# Patient Record
Sex: Male | Born: 1955 | Race: Black or African American | Hispanic: No | Marital: Married | Smoking: Former smoker
Health system: Southern US, Community
[De-identification: ages and names within clinical notes are randomized; demographics above are authoritative.]

## PROBLEM LIST (undated history)

## (undated) DIAGNOSIS — G4730 Sleep apnea, unspecified: Secondary | ICD-10-CM

## (undated) DIAGNOSIS — I10 Essential (primary) hypertension: Secondary | ICD-10-CM

## (undated) DIAGNOSIS — T4145XA Adverse effect of unspecified anesthetic, initial encounter: Secondary | ICD-10-CM

## (undated) DIAGNOSIS — E1343 Other specified diabetes mellitus with diabetic autonomic (poly)neuropathy: Secondary | ICD-10-CM

## (undated) DIAGNOSIS — Z9889 Other specified postprocedural states: Secondary | ICD-10-CM

## (undated) DIAGNOSIS — E119 Type 2 diabetes mellitus without complications: Secondary | ICD-10-CM

## (undated) DIAGNOSIS — I509 Heart failure, unspecified: Secondary | ICD-10-CM

## (undated) DIAGNOSIS — M179 Osteoarthritis of knee, unspecified: Secondary | ICD-10-CM

## (undated) DIAGNOSIS — I5189 Other ill-defined heart diseases: Secondary | ICD-10-CM

## (undated) DIAGNOSIS — F419 Anxiety disorder, unspecified: Secondary | ICD-10-CM

## (undated) DIAGNOSIS — N289 Disorder of kidney and ureter, unspecified: Secondary | ICD-10-CM

## (undated) DIAGNOSIS — E039 Hypothyroidism, unspecified: Secondary | ICD-10-CM

## (undated) DIAGNOSIS — M1710 Unilateral primary osteoarthritis, unspecified knee: Secondary | ICD-10-CM

## (undated) DIAGNOSIS — T8859XA Other complications of anesthesia, initial encounter: Secondary | ICD-10-CM

## (undated) DIAGNOSIS — M1990 Unspecified osteoarthritis, unspecified site: Secondary | ICD-10-CM

## (undated) HISTORY — PX: BACK SURGERY: SHX140

## (undated) HISTORY — DX: Other specified postprocedural states: Z98.890

## (undated) HISTORY — DX: Sleep apnea, unspecified: G47.30

## (undated) HISTORY — DX: Type 2 diabetes mellitus without complications: E11.9

## (undated) HISTORY — DX: Unspecified osteoarthritis, unspecified site: M19.90

## (undated) HISTORY — PX: THYROIDECTOMY, PARTIAL: SHX18

## (undated) HISTORY — DX: Hypothyroidism, unspecified: E03.9

## (undated) HISTORY — DX: Osteoarthritis of knee, unspecified: M17.9

## (undated) HISTORY — DX: Unilateral primary osteoarthritis, unspecified knee: M17.10

## (undated) HISTORY — DX: Essential (primary) hypertension: I10

## (undated) HISTORY — DX: Disorder of kidney and ureter, unspecified: N28.9

---

## 1998-05-19 ENCOUNTER — Inpatient Hospital Stay (HOSPITAL_COMMUNITY): Admission: RE | Admit: 1998-05-19 | Discharge: 1998-05-22 | Payer: Self-pay | Admitting: Orthopedic Surgery

## 1998-05-25 ENCOUNTER — Other Ambulatory Visit: Admission: RE | Admit: 1998-05-25 | Discharge: 1998-05-25 | Payer: Self-pay | Admitting: Orthopedic Surgery

## 1998-06-01 ENCOUNTER — Other Ambulatory Visit: Admission: RE | Admit: 1998-06-01 | Discharge: 1998-06-01 | Payer: Self-pay | Admitting: Orthopedic Surgery

## 1998-06-08 ENCOUNTER — Other Ambulatory Visit: Admission: RE | Admit: 1998-06-08 | Discharge: 1998-06-08 | Payer: Self-pay | Admitting: Orthopedic Surgery

## 1998-06-26 ENCOUNTER — Encounter: Admission: RE | Admit: 1998-06-26 | Discharge: 1998-09-24 | Payer: Self-pay | Admitting: Orthopedic Surgery

## 1998-11-11 HISTORY — PX: JOINT REPLACEMENT: SHX530

## 1999-08-06 ENCOUNTER — Ambulatory Visit (HOSPITAL_COMMUNITY): Admission: RE | Admit: 1999-08-06 | Discharge: 1999-08-06 | Payer: Self-pay | Admitting: Internal Medicine

## 1999-08-06 ENCOUNTER — Encounter: Payer: Self-pay | Admitting: Internal Medicine

## 1999-11-12 HISTORY — PX: TOTAL KNEE ARTHROPLASTY: SHX125

## 2000-07-18 ENCOUNTER — Ambulatory Visit (HOSPITAL_COMMUNITY): Admission: RE | Admit: 2000-07-18 | Discharge: 2000-07-18 | Payer: Self-pay | Admitting: Internal Medicine

## 2000-07-18 ENCOUNTER — Encounter: Payer: Self-pay | Admitting: Internal Medicine

## 2000-07-27 ENCOUNTER — Ambulatory Visit (HOSPITAL_BASED_OUTPATIENT_CLINIC_OR_DEPARTMENT_OTHER): Admission: RE | Admit: 2000-07-27 | Discharge: 2000-07-27 | Payer: Self-pay | Admitting: Internal Medicine

## 2000-12-18 ENCOUNTER — Ambulatory Visit (HOSPITAL_COMMUNITY): Admission: RE | Admit: 2000-12-18 | Discharge: 2000-12-18 | Payer: Self-pay | Admitting: Family Medicine

## 2000-12-18 ENCOUNTER — Encounter: Payer: Self-pay | Admitting: Family Medicine

## 2001-01-05 ENCOUNTER — Ambulatory Visit (HOSPITAL_COMMUNITY): Admission: RE | Admit: 2001-01-05 | Discharge: 2001-01-05 | Payer: Self-pay | Admitting: Cardiology

## 2001-08-22 ENCOUNTER — Inpatient Hospital Stay (HOSPITAL_COMMUNITY): Admission: EM | Admit: 2001-08-22 | Discharge: 2001-08-24 | Payer: Self-pay | Admitting: Emergency Medicine

## 2001-08-23 ENCOUNTER — Encounter: Payer: Self-pay | Admitting: Internal Medicine

## 2002-12-22 ENCOUNTER — Encounter: Admission: RE | Admit: 2002-12-22 | Discharge: 2002-12-22 | Payer: Self-pay | Admitting: Family Medicine

## 2003-01-19 ENCOUNTER — Encounter: Admission: RE | Admit: 2003-01-19 | Discharge: 2003-01-19 | Payer: Self-pay | Admitting: Sports Medicine

## 2003-01-25 ENCOUNTER — Encounter: Admission: RE | Admit: 2003-01-25 | Discharge: 2003-04-25 | Payer: Self-pay | Admitting: Sports Medicine

## 2003-03-02 ENCOUNTER — Encounter: Admission: RE | Admit: 2003-03-02 | Discharge: 2003-03-02 | Payer: Self-pay | Admitting: Sports Medicine

## 2003-04-07 ENCOUNTER — Encounter: Admission: RE | Admit: 2003-04-07 | Discharge: 2003-04-07 | Payer: Self-pay | Admitting: Sports Medicine

## 2003-06-12 ENCOUNTER — Inpatient Hospital Stay (HOSPITAL_COMMUNITY): Admission: EM | Admit: 2003-06-12 | Discharge: 2003-06-16 | Payer: Self-pay | Admitting: Emergency Medicine

## 2003-06-12 ENCOUNTER — Encounter: Payer: Self-pay | Admitting: Emergency Medicine

## 2003-06-13 ENCOUNTER — Encounter (INDEPENDENT_AMBULATORY_CARE_PROVIDER_SITE_OTHER): Payer: Self-pay | Admitting: *Deleted

## 2003-06-28 ENCOUNTER — Encounter: Admission: RE | Admit: 2003-06-28 | Discharge: 2003-06-28 | Payer: Self-pay | Admitting: Sports Medicine

## 2003-07-26 ENCOUNTER — Encounter: Admission: RE | Admit: 2003-07-26 | Discharge: 2003-07-26 | Payer: Self-pay | Admitting: Sports Medicine

## 2003-08-30 ENCOUNTER — Encounter: Admission: RE | Admit: 2003-08-30 | Discharge: 2003-08-30 | Payer: Self-pay | Admitting: Sports Medicine

## 2003-09-13 ENCOUNTER — Encounter: Admission: RE | Admit: 2003-09-13 | Discharge: 2003-09-13 | Payer: Self-pay | Admitting: Family Medicine

## 2003-09-27 ENCOUNTER — Encounter: Admission: RE | Admit: 2003-09-27 | Discharge: 2003-09-27 | Payer: Self-pay | Admitting: Family Medicine

## 2003-11-01 ENCOUNTER — Encounter: Admission: RE | Admit: 2003-11-01 | Discharge: 2003-11-01 | Payer: Self-pay | Admitting: Sports Medicine

## 2003-12-07 ENCOUNTER — Encounter: Admission: RE | Admit: 2003-12-07 | Discharge: 2003-12-07 | Payer: Self-pay | Admitting: Family Medicine

## 2004-01-03 ENCOUNTER — Encounter: Admission: RE | Admit: 2004-01-03 | Discharge: 2004-01-03 | Payer: Self-pay | Admitting: Family Medicine

## 2004-02-01 ENCOUNTER — Encounter: Admission: RE | Admit: 2004-02-01 | Discharge: 2004-02-01 | Payer: Self-pay | Admitting: Sports Medicine

## 2004-02-14 ENCOUNTER — Encounter: Admission: RE | Admit: 2004-02-14 | Discharge: 2004-02-14 | Payer: Self-pay | Admitting: Family Medicine

## 2004-04-03 ENCOUNTER — Encounter: Admission: RE | Admit: 2004-04-03 | Discharge: 2004-04-03 | Payer: Self-pay | Admitting: Sports Medicine

## 2004-04-17 ENCOUNTER — Encounter: Admission: RE | Admit: 2004-04-17 | Discharge: 2004-04-17 | Payer: Self-pay | Admitting: Sports Medicine

## 2004-06-07 ENCOUNTER — Ambulatory Visit (HOSPITAL_COMMUNITY): Admission: RE | Admit: 2004-06-07 | Discharge: 2004-06-07 | Payer: Self-pay | Admitting: Cardiology

## 2004-09-04 ENCOUNTER — Ambulatory Visit (HOSPITAL_COMMUNITY): Admission: RE | Admit: 2004-09-04 | Discharge: 2004-09-04 | Payer: Self-pay | Admitting: Sports Medicine

## 2004-09-04 ENCOUNTER — Ambulatory Visit: Payer: Self-pay | Admitting: Sports Medicine

## 2004-09-18 ENCOUNTER — Ambulatory Visit: Payer: Self-pay | Admitting: Family Medicine

## 2004-09-22 ENCOUNTER — Emergency Department (HOSPITAL_COMMUNITY): Admission: EM | Admit: 2004-09-22 | Discharge: 2004-09-22 | Payer: Self-pay | Admitting: Family Medicine

## 2004-10-02 ENCOUNTER — Ambulatory Visit: Payer: Self-pay | Admitting: Sports Medicine

## 2004-10-09 ENCOUNTER — Ambulatory Visit: Payer: Self-pay | Admitting: Sports Medicine

## 2004-10-24 ENCOUNTER — Ambulatory Visit: Payer: Self-pay | Admitting: Sports Medicine

## 2004-10-24 ENCOUNTER — Ambulatory Visit (HOSPITAL_COMMUNITY): Admission: RE | Admit: 2004-10-24 | Discharge: 2004-10-24 | Payer: Self-pay | Admitting: Sports Medicine

## 2004-10-31 ENCOUNTER — Ambulatory Visit: Payer: Self-pay | Admitting: Sports Medicine

## 2004-11-07 ENCOUNTER — Ambulatory Visit: Payer: Self-pay | Admitting: Sports Medicine

## 2004-11-14 ENCOUNTER — Ambulatory Visit: Payer: Self-pay | Admitting: Sports Medicine

## 2004-11-27 ENCOUNTER — Encounter: Admission: RE | Admit: 2004-11-27 | Discharge: 2004-11-27 | Payer: Self-pay | Admitting: Cardiology

## 2005-01-01 ENCOUNTER — Ambulatory Visit: Payer: Self-pay | Admitting: Sports Medicine

## 2005-01-01 HISTORY — PX: OTHER SURGICAL HISTORY: SHX169

## 2005-02-12 ENCOUNTER — Ambulatory Visit: Payer: Self-pay | Admitting: Sports Medicine

## 2005-03-19 ENCOUNTER — Ambulatory Visit: Payer: Self-pay | Admitting: Sports Medicine

## 2005-05-06 ENCOUNTER — Ambulatory Visit: Payer: Self-pay | Admitting: Sports Medicine

## 2005-05-06 ENCOUNTER — Ambulatory Visit (HOSPITAL_COMMUNITY): Admission: RE | Admit: 2005-05-06 | Discharge: 2005-05-06 | Payer: Self-pay | Admitting: Sports Medicine

## 2005-05-07 ENCOUNTER — Ambulatory Visit: Payer: Self-pay | Admitting: Sports Medicine

## 2005-05-15 ENCOUNTER — Ambulatory Visit: Payer: Self-pay | Admitting: Sports Medicine

## 2005-06-11 ENCOUNTER — Ambulatory Visit: Payer: Self-pay | Admitting: Sports Medicine

## 2005-12-10 ENCOUNTER — Ambulatory Visit: Payer: Self-pay | Admitting: Sports Medicine

## 2005-12-23 ENCOUNTER — Ambulatory Visit: Payer: Self-pay | Admitting: Psychology

## 2006-01-07 ENCOUNTER — Ambulatory Visit: Payer: Self-pay | Admitting: Sports Medicine

## 2006-02-25 ENCOUNTER — Encounter: Admission: RE | Admit: 2006-02-25 | Discharge: 2006-02-25 | Payer: Self-pay | Admitting: Sports Medicine

## 2006-02-25 ENCOUNTER — Ambulatory Visit: Payer: Self-pay | Admitting: Sports Medicine

## 2006-03-25 ENCOUNTER — Ambulatory Visit: Payer: Self-pay | Admitting: Sports Medicine

## 2006-04-01 ENCOUNTER — Ambulatory Visit: Payer: Self-pay | Admitting: Sports Medicine

## 2006-04-01 ENCOUNTER — Encounter: Admission: RE | Admit: 2006-04-01 | Discharge: 2006-06-30 | Payer: Self-pay | Admitting: Sports Medicine

## 2006-05-01 ENCOUNTER — Ambulatory Visit: Payer: Self-pay | Admitting: Sports Medicine

## 2006-05-29 ENCOUNTER — Ambulatory Visit: Payer: Self-pay | Admitting: Sports Medicine

## 2006-07-31 ENCOUNTER — Ambulatory Visit: Payer: Self-pay | Admitting: Sports Medicine

## 2006-09-17 ENCOUNTER — Encounter (INDEPENDENT_AMBULATORY_CARE_PROVIDER_SITE_OTHER): Payer: Self-pay | Admitting: Specialist

## 2006-09-17 ENCOUNTER — Ambulatory Visit (HOSPITAL_COMMUNITY): Admission: RE | Admit: 2006-09-17 | Discharge: 2006-09-17 | Payer: Self-pay | Admitting: Gastroenterology

## 2006-09-30 ENCOUNTER — Ambulatory Visit: Payer: Self-pay | Admitting: Family Medicine

## 2006-10-21 ENCOUNTER — Ambulatory Visit: Payer: Self-pay | Admitting: Sports Medicine

## 2006-10-22 ENCOUNTER — Encounter: Admission: RE | Admit: 2006-10-22 | Discharge: 2007-01-20 | Payer: Self-pay | Admitting: Sports Medicine

## 2006-12-02 ENCOUNTER — Ambulatory Visit: Payer: Self-pay | Admitting: Family Medicine

## 2006-12-02 ENCOUNTER — Encounter: Payer: Self-pay | Admitting: Family Medicine

## 2006-12-02 LAB — CONVERTED CEMR LAB
BUN: 27 mg/dL — ABNORMAL HIGH (ref 6–23)
Chloride: 98 meq/L (ref 96–112)
Potassium: 4.4 meq/L (ref 3.5–5.3)

## 2006-12-21 ENCOUNTER — Emergency Department (HOSPITAL_COMMUNITY): Admission: EM | Admit: 2006-12-21 | Discharge: 2006-12-21 | Payer: Self-pay | Admitting: Emergency Medicine

## 2006-12-26 ENCOUNTER — Encounter: Payer: Self-pay | Admitting: Family Medicine

## 2006-12-26 ENCOUNTER — Ambulatory Visit: Payer: Self-pay | Admitting: Family Medicine

## 2006-12-26 LAB — CONVERTED CEMR LAB
CO2: 22 meq/L (ref 19–32)
Glucose, Bld: 132 mg/dL — ABNORMAL HIGH (ref 70–99)
Potassium: 3.5 meq/L (ref 3.5–5.3)
Sodium: 139 meq/L (ref 135–145)

## 2007-01-08 DIAGNOSIS — E669 Obesity, unspecified: Secondary | ICD-10-CM

## 2007-01-08 DIAGNOSIS — F329 Major depressive disorder, single episode, unspecified: Secondary | ICD-10-CM | POA: Insufficient documentation

## 2007-01-08 DIAGNOSIS — G4733 Obstructive sleep apnea (adult) (pediatric): Secondary | ICD-10-CM

## 2007-01-08 DIAGNOSIS — I2510 Atherosclerotic heart disease of native coronary artery without angina pectoris: Secondary | ICD-10-CM | POA: Insufficient documentation

## 2007-01-08 DIAGNOSIS — I5022 Chronic systolic (congestive) heart failure: Secondary | ICD-10-CM

## 2007-01-08 DIAGNOSIS — F17200 Nicotine dependence, unspecified, uncomplicated: Secondary | ICD-10-CM

## 2007-01-08 DIAGNOSIS — I10 Essential (primary) hypertension: Secondary | ICD-10-CM | POA: Insufficient documentation

## 2007-01-08 DIAGNOSIS — E039 Hypothyroidism, unspecified: Secondary | ICD-10-CM | POA: Insufficient documentation

## 2007-01-08 DIAGNOSIS — M109 Gout, unspecified: Secondary | ICD-10-CM

## 2007-01-13 ENCOUNTER — Ambulatory Visit: Payer: Self-pay | Admitting: Sports Medicine

## 2007-01-14 ENCOUNTER — Encounter (INDEPENDENT_AMBULATORY_CARE_PROVIDER_SITE_OTHER): Payer: Self-pay | Admitting: Sports Medicine

## 2007-01-14 ENCOUNTER — Ambulatory Visit: Payer: Self-pay | Admitting: Internal Medicine

## 2007-01-16 ENCOUNTER — Ambulatory Visit: Payer: Self-pay | Admitting: Internal Medicine

## 2007-01-20 ENCOUNTER — Ambulatory Visit (HOSPITAL_COMMUNITY): Admission: RE | Admit: 2007-01-20 | Discharge: 2007-01-20 | Payer: Self-pay | Admitting: Internal Medicine

## 2007-01-27 ENCOUNTER — Ambulatory Visit: Payer: Self-pay | Admitting: Sports Medicine

## 2007-01-27 ENCOUNTER — Ambulatory Visit: Payer: Self-pay | Admitting: Internal Medicine

## 2007-01-27 LAB — CONVERTED CEMR LAB
BUN: 14 mg/dL (ref 6–23)
CO2: 28 meq/L (ref 19–32)
Chloride: 104 meq/L (ref 96–112)
Glucose, Bld: 202 mg/dL — ABNORMAL HIGH (ref 70–99)
Potassium: 3.3 meq/L — ABNORMAL LOW (ref 3.5–5.3)

## 2007-02-06 ENCOUNTER — Telehealth (INDEPENDENT_AMBULATORY_CARE_PROVIDER_SITE_OTHER): Payer: Self-pay | Admitting: *Deleted

## 2007-03-10 ENCOUNTER — Ambulatory Visit: Payer: Self-pay | Admitting: Sports Medicine

## 2007-03-10 DIAGNOSIS — M1990 Unspecified osteoarthritis, unspecified site: Secondary | ICD-10-CM | POA: Insufficient documentation

## 2007-03-10 LAB — CONVERTED CEMR LAB
CO2: 24 meq/L (ref 19–32)
Calcium: 8.6 mg/dL (ref 8.4–10.5)
Creatinine, Ser: 1.27 mg/dL (ref 0.40–1.50)
Glucose, Bld: 193 mg/dL — ABNORMAL HIGH (ref 70–99)

## 2007-04-22 ENCOUNTER — Telehealth (INDEPENDENT_AMBULATORY_CARE_PROVIDER_SITE_OTHER): Payer: Self-pay | Admitting: Sports Medicine

## 2007-04-28 ENCOUNTER — Ambulatory Visit: Payer: Self-pay | Admitting: Sports Medicine

## 2007-04-28 LAB — CONVERTED CEMR LAB: Hgb A1c MFr Bld: 9 %

## 2007-04-29 LAB — CONVERTED CEMR LAB
ALT: 87 units/L — ABNORMAL HIGH (ref 0–53)
AST: 70 units/L — ABNORMAL HIGH (ref 0–37)
CO2: 21 meq/L (ref 19–32)
Calcium: 9 mg/dL (ref 8.4–10.5)
Chloride: 105 meq/L (ref 96–112)
Sodium: 143 meq/L (ref 135–145)
TSH: 2.49 microintl units/mL (ref 0.350–5.50)
Total Bilirubin: 0.5 mg/dL (ref 0.3–1.2)
Total Protein: 7.6 g/dL (ref 6.0–8.3)

## 2007-06-12 ENCOUNTER — Ambulatory Visit: Payer: Self-pay | Admitting: Family Medicine

## 2007-06-12 ENCOUNTER — Encounter: Payer: Self-pay | Admitting: Family Medicine

## 2007-06-12 LAB — CONVERTED CEMR LAB
BUN: 12 mg/dL (ref 6–23)
Chloride: 103 meq/L (ref 96–112)
Creatinine, Ser: 1.2 mg/dL (ref 0.40–1.50)
Glucose, Bld: 109 mg/dL — ABNORMAL HIGH (ref 70–99)

## 2007-06-22 ENCOUNTER — Encounter: Payer: Self-pay | Admitting: Family Medicine

## 2007-06-23 ENCOUNTER — Encounter: Payer: Self-pay | Admitting: Family Medicine

## 2007-09-02 ENCOUNTER — Ambulatory Visit: Payer: Self-pay | Admitting: Family Medicine

## 2007-11-30 ENCOUNTER — Encounter: Payer: Self-pay | Admitting: Family Medicine

## 2007-11-30 ENCOUNTER — Ambulatory Visit: Payer: Self-pay | Admitting: Sports Medicine

## 2007-11-30 LAB — CONVERTED CEMR LAB: Hgb A1c MFr Bld: 12.2 %

## 2007-12-02 ENCOUNTER — Ambulatory Visit: Payer: Self-pay | Admitting: Family Medicine

## 2007-12-04 ENCOUNTER — Ambulatory Visit: Payer: Self-pay | Admitting: Family Medicine

## 2007-12-18 ENCOUNTER — Ambulatory Visit: Payer: Self-pay | Admitting: Family Medicine

## 2007-12-18 LAB — CONVERTED CEMR LAB
Bilirubin Urine: NEGATIVE
Glucose, Urine, Semiquant: 500
WBC Urine, dipstick: NEGATIVE
pH: 6

## 2007-12-21 ENCOUNTER — Telehealth: Payer: Self-pay | Admitting: *Deleted

## 2007-12-30 ENCOUNTER — Ambulatory Visit: Payer: Self-pay | Admitting: Family Medicine

## 2007-12-31 ENCOUNTER — Ambulatory Visit: Payer: Self-pay | Admitting: Family Medicine

## 2008-01-28 ENCOUNTER — Ambulatory Visit: Payer: Self-pay | Admitting: Family Medicine

## 2008-02-16 ENCOUNTER — Telehealth (INDEPENDENT_AMBULATORY_CARE_PROVIDER_SITE_OTHER): Payer: Self-pay | Admitting: Pharmacist

## 2008-03-03 ENCOUNTER — Ambulatory Visit: Payer: Self-pay | Admitting: Family Medicine

## 2008-03-07 ENCOUNTER — Encounter: Payer: Self-pay | Admitting: Family Medicine

## 2008-04-27 ENCOUNTER — Ambulatory Visit: Payer: Self-pay | Admitting: Family Medicine

## 2008-04-28 ENCOUNTER — Encounter: Payer: Self-pay | Admitting: Family Medicine

## 2008-05-03 ENCOUNTER — Telehealth: Payer: Self-pay | Admitting: *Deleted

## 2008-05-03 ENCOUNTER — Telehealth: Payer: Self-pay | Admitting: Family Medicine

## 2008-05-30 ENCOUNTER — Encounter: Payer: Self-pay | Admitting: Family Medicine

## 2008-05-30 ENCOUNTER — Ambulatory Visit: Payer: Self-pay | Admitting: Family Medicine

## 2008-05-30 LAB — CONVERTED CEMR LAB: Hgb A1c MFr Bld: 7.7 %

## 2008-06-03 ENCOUNTER — Encounter: Payer: Self-pay | Admitting: Family Medicine

## 2008-06-03 LAB — CONVERTED CEMR LAB
Albumin: 4 g/dL (ref 3.5–5.2)
Alkaline Phosphatase: 89 units/L (ref 39–117)
BUN: 12 mg/dL (ref 6–23)
CO2: 24 meq/L (ref 19–32)
Calcium: 8.9 mg/dL (ref 8.4–10.5)
Creatinine, Ser: 1.25 mg/dL (ref 0.40–1.50)
Total Protein: 6.9 g/dL (ref 6.0–8.3)

## 2008-06-06 ENCOUNTER — Encounter: Payer: Self-pay | Admitting: Family Medicine

## 2008-10-05 ENCOUNTER — Ambulatory Visit: Payer: Self-pay | Admitting: Family Medicine

## 2008-10-05 LAB — CONVERTED CEMR LAB: Hgb A1c MFr Bld: 8.8 %

## 2008-11-08 ENCOUNTER — Encounter: Payer: Self-pay | Admitting: Family Medicine

## 2008-11-09 ENCOUNTER — Encounter: Payer: Self-pay | Admitting: Family Medicine

## 2008-11-09 DIAGNOSIS — E785 Hyperlipidemia, unspecified: Secondary | ICD-10-CM

## 2009-01-04 ENCOUNTER — Ambulatory Visit: Payer: Self-pay | Admitting: Family Medicine

## 2009-01-04 LAB — CONVERTED CEMR LAB
BUN: 12 mg/dL (ref 6–23)
CO2: 19 meq/L (ref 19–32)
Calcium: 8.9 mg/dL (ref 8.4–10.5)
Chloride: 110 meq/L (ref 96–112)
Creatinine, Ser: 1.02 mg/dL (ref 0.40–1.50)
TSH: 7.676 microintl units/mL — ABNORMAL HIGH (ref 0.350–4.50)

## 2009-01-09 ENCOUNTER — Encounter: Payer: Self-pay | Admitting: Family Medicine

## 2009-03-15 ENCOUNTER — Ambulatory Visit: Payer: Self-pay | Admitting: Family Medicine

## 2009-03-15 LAB — CONVERTED CEMR LAB
ALT: 101 units/L — ABNORMAL HIGH (ref 0–53)
AST: 67 units/L — ABNORMAL HIGH (ref 0–37)
Albumin: 4.2 g/dL (ref 3.5–5.2)
Calcium: 8.7 mg/dL (ref 8.4–10.5)
Chloride: 107 meq/L (ref 96–112)
PSA: 0.53 ng/mL (ref 0.10–4.00)
Potassium: 3.6 meq/L (ref 3.5–5.3)
Total Protein: 7.1 g/dL (ref 6.0–8.3)

## 2009-03-17 ENCOUNTER — Encounter: Payer: Self-pay | Admitting: Family Medicine

## 2009-03-29 ENCOUNTER — Telehealth: Payer: Self-pay | Admitting: Family Medicine

## 2009-03-29 ENCOUNTER — Telehealth: Payer: Self-pay | Admitting: *Deleted

## 2009-03-30 ENCOUNTER — Encounter: Payer: Self-pay | Admitting: Family Medicine

## 2009-09-27 ENCOUNTER — Ambulatory Visit: Payer: Self-pay | Admitting: Family Medicine

## 2009-09-27 LAB — CONVERTED CEMR LAB
Albumin/Creatinine Ratio, Urine, POC: ABNORMAL
Bilirubin Urine: NEGATIVE
CO2: 20 meq/L (ref 19–32)
Calcium: 8.9 mg/dL (ref 8.4–10.5)
Hgb A1c MFr Bld: 7.3 %
Ketones, urine, test strip: NEGATIVE
Nitrite: NEGATIVE
Sodium: 140 meq/L (ref 135–145)
Specific Gravity, Urine: 1.01

## 2009-09-28 ENCOUNTER — Encounter: Payer: Self-pay | Admitting: Family Medicine

## 2009-10-02 ENCOUNTER — Encounter: Payer: Self-pay | Admitting: Family Medicine

## 2009-12-06 ENCOUNTER — Encounter: Payer: Self-pay | Admitting: Family Medicine

## 2010-01-26 ENCOUNTER — Telehealth: Payer: Self-pay | Admitting: *Deleted

## 2010-02-28 ENCOUNTER — Ambulatory Visit: Payer: Self-pay | Admitting: Family Medicine

## 2010-02-28 LAB — CONVERTED CEMR LAB
ALT: 31 units/L (ref 0–53)
AST: 23 units/L (ref 0–37)
Albumin: 4.1 g/dL (ref 3.5–5.2)
BUN: 13 mg/dL (ref 6–23)
Calcium: 9 mg/dL (ref 8.4–10.5)
Chloride: 103 meq/L (ref 96–112)
Potassium: 4 meq/L (ref 3.5–5.3)
Sodium: 138 meq/L (ref 135–145)
Total Protein: 7.3 g/dL (ref 6.0–8.3)

## 2010-03-09 ENCOUNTER — Encounter: Payer: Self-pay | Admitting: Family Medicine

## 2010-08-01 ENCOUNTER — Ambulatory Visit: Payer: Self-pay | Admitting: Family Medicine

## 2010-08-01 DIAGNOSIS — E118 Type 2 diabetes mellitus with unspecified complications: Secondary | ICD-10-CM

## 2010-08-01 LAB — CONVERTED CEMR LAB
BUN: 14 mg/dL (ref 6–23)
CO2: 19 meq/L (ref 19–32)
Calcium: 8.9 mg/dL (ref 8.4–10.5)
Creatinine, Ser: 1.2 mg/dL (ref 0.40–1.50)
Glucose, Bld: 208 mg/dL — ABNORMAL HIGH (ref 70–99)
Hgb A1c MFr Bld: 8.5 %
Total Bilirubin: 0.4 mg/dL (ref 0.3–1.2)

## 2010-08-06 ENCOUNTER — Encounter: Payer: Self-pay | Admitting: Family Medicine

## 2010-08-14 ENCOUNTER — Telehealth: Payer: Self-pay | Admitting: Family Medicine

## 2010-08-30 ENCOUNTER — Telehealth: Payer: Self-pay | Admitting: *Deleted

## 2010-08-31 ENCOUNTER — Encounter (INDEPENDENT_AMBULATORY_CARE_PROVIDER_SITE_OTHER): Payer: Self-pay | Admitting: Pharmacist

## 2010-10-02 ENCOUNTER — Ambulatory Visit: Payer: Self-pay | Admitting: Cardiology

## 2010-10-09 ENCOUNTER — Telehealth: Payer: Self-pay | Admitting: *Deleted

## 2010-11-14 ENCOUNTER — Telehealth: Payer: Self-pay | Admitting: Family Medicine

## 2010-12-02 ENCOUNTER — Encounter: Payer: Self-pay | Admitting: Family Medicine

## 2010-12-11 NOTE — Progress Notes (Signed)
Summary: phn msg  Phone Note Call from Patient Call back at [facility] Phone [phone]   Caller: Patient Summary of Call: needs to talk to nurse about his cpap machine -  Initial call taken by: De Nurse,  August 14, 2010 11:57 AM  Follow-up for Phone Call        Needing CPAP mask has not been able to get one in . Having issues sleeping. [facility] is where he wants his mask from.  Follow-up by: Jimmy Footman, CMA,  August 14, 2010 2:50 PM  Additional Follow-up for Phone Call Additional follow up Details #1::        DWT  if you can give me the fax number of the place he wants his mask from, and his mask partculars--type, size etc--I will be happy to fax a Rx Thanks!  Denny Levy MD  August 14, 2010 3:08 PM     Additional Follow-up for Phone Call Additional follow up Details #2::    spoke with patient... medium size mask...he says the type he needs is the "blue soft gel type". [facility] fax# is [phone] Follow-up by: Jimmy Footman, CMA,  August 14, 2010 3:34 PM  New/Updated Medications: * LARGE BLUE SOFT GEL CPAP MASK dispense 1 sig as directed Prescriptions: LARGE BLUE SOFT GEL CPAP MASK dispense 1 sig as directed  #1 x 0   Entered and Authorized by:   Denny Levy MD   Signed by:   Denny Levy MD on 08/20/2010   Method used:   Printed then faxed to ...       [facility]* (mail-order)       [address]       [place], [place]  [address]       Ph: [phone]       Fax: [phone]   RxID:   [id number]  faxed to above number

## 2010-12-11 NOTE — Miscellaneous (Signed)
Summary: Orders Update  Clinical Lists Changes  Problems: Added new problem of ENCOUNTER FOR LONG-TERM USE OF OTHER MEDICATIONS (ICD-V58.69) Orders: Added new Test order of B12-[facility] (82607-23330) - Signed Added new Test order of CBC-[facility] (85027) - Signed 

## 2010-12-11 NOTE — Letter (Signed)
Summary: LAB Letter  [facility]  [address]   [place], [place] [address]   Phone: [phone]  Fax: [phone]    03/09/2010  [name] [name] [address] [place], [place]  [address]  Dear Mr. Badger,  Your lab work looks good except for your A1C which was 10, and less than 7 is goal. Your LDL cholesterol is 60 and your goal is less than 70 so right on the mark!         Sincerely,   Denny Levy MD

## 2010-12-11 NOTE — Progress Notes (Signed)
  Phone Note Other Incoming   Summary of Call: Lurena Joiner from [facility].  Please send back the order that was faxed for the rx.  Only received the rx.  Order form have necesary info that is needed to complete process.  Fax # [phone].   Initial call taken by: Abundio Miu,  August 30, 2010 2:33 PM  Follow-up for Phone Call        I never received the order form so they need to [facility] it to me Thanks!  Denny Levy MD  August 31, 2010 11:03 AM   Additional Follow-up for Phone Call Additional follow up Details #1::        spoke with neil with [facility] coustomer service. He is going to fax over request again with attn to dr. Jennette Kettle Additional Follow-up by: Jimmy Footman, CMA,  August 31, 2010 11:55 AM

## 2010-12-11 NOTE — Progress Notes (Signed)
Summary: phn msg  Phone Note Call from Patient Call back at [phone]   Caller: Lurena Joiner- [facility] Summary of Call: calling about [facility] -checking on Phys orders  Initial call taken by: De Nurse,  October 09, 2010 9:03 AM  Follow-up for Phone Call        Archie Patten did you find his sleep study????? Thanks!  Denny Levy MD  October 09, 2010 3:52 PM   Additional Follow-up for Phone Call Additional follow up Details #1::        sorry, no one has a record of this pt's sleep study.  I thought that i had spoke to you about this, again my apologies Additional Follow-up by: Loralee Pacas CMA,  October 09, 2010 5:26 PM    Additional Follow-up for Phone Call Additional follow up Details #2::    OK call Kaydin and see if HE knows when and where it was done--if not--we will probably have to do ANOTHER sleep study. Please also tell him WHy it is taking so long to get his CPAp stuff---they will not authorize without a copy of his sleep study.  Thanks!  Denny Levy MD  October 10, 2010 8:56 AM   Additional Follow-up for Phone Call Additional follow up Details #3:: Details for Additional Follow-up Action Taken: information sent to perform another sleep study for pt Additional Follow-up by: Loralee Pacas CMA,  October 11, 2010 10:10 AM

## 2010-12-11 NOTE — Letter (Signed)
Summary: [facility]  [address]   [place], [place] [address]   Phone: [phone]  Fax: [phone]    08/06/2010  [name] [name] [address] [place], [place]  [address]  Dear [name],  All of your labs were OK.         Sincerely,   Denny Levy MD  Appended Document: LABLetter mailed.

## 2010-12-11 NOTE — Assessment & Plan Note (Signed)
Summary: dental referral/eo   Vital Signs:  Patient profile:   55 year old male Height:      71 inches Weight:      280.8 pounds BMI:     39.31 Temp:     98.2 degrees F oral Pulse rate:   89 / minute BP sitting:   117 / 75  (left arm) Cuff size:   large  Vitals Entered By: Gladstone Pih (February 28, 2010 8:50 AM) CC: Wants dental referral,for upper dentures, med refill Is Patient Diabetic? No Pain Assessment Patient in pain? no        Primary Care Provider:  Denny Levy MD  CC:  Wants dental referral, for upper dentures, and med refill.  History of Present Illness: Diabetes follow up with blood sugars in  moderately   good control,   no episodes of low blood sugar. Taking medicines regularly and having no problems with them. Needs refills  A lot of problems w his teeth--thinks he needs new dentures--has a loose toothe that is supposed to hold his bridge in place--not working well  Having problems w his CPAP mask and cannot afford a new one   Habits & Providers  Alcohol-Tobacco-Diet     Tobacco Status: current     Tobacco Counseling: to quit use of tobacco products     Cigarette Packs/Day: 1.0  Current Medications (verified): 1)  Levothroid 100 Mcg  Tabs (Levothyroxine Sodium) .... On Hold Per Dr Deborah Chalk 2)  Allopurinol 100 Mg Tabs (Allopurinol) .... Take 1 Tablet By Mouth Once A Day 3)  Cardura 4 Mg Tabs (Doxazosin Mesylate) .Marland Kitchen.. 1 By Mouth Qd 4)  Colchicine 0.6 Mg Tabs (Colchicine) .... Take 1 Tablet By Mouth Twice A Day 5)  Glucophage 1000 Mg Tabs (Metformin Hcl) .... Take 1 Tablet By Mouth Twice A Day 6)  Glucotrol Xl 10 Mg Tb24 (Glipizide) .... Take 1 Tablet By Mouth Twice A Day 7)  Simvastatin 20 Mg  Tabs (Simvastatin) .Marland Kitchen.. 1 By Mouth Qd 8)  Lotensin 40 Mg Tabs (Benazepril Hcl) .... Take 1 Tablet By Mouth Once A Day 9)  Metoprolol Tartrate 50 Mg Tabs (Metoprolol Tartrate) .... 2 Tabs By Mouth Two Times A Day 10)  Norvasc 10 Mg Tabs (Amlodipine Besylate) .... Take  1 Tablet By Mouth Once A Day 11)  Buspirone Hcl 10 Mg Tabs (Buspirone Hcl) .Marland Kitchen.. 1 Tablet By Mouth Twice A Day 12)  Lantus 100 Unit/ml  Soln (Insulin Glargine) .... Sig 50 U Once Daily As Directed Disp 3 Months 13)  Bd Insulin Syringe Ultrafine 30g X 1/2" 1 Ml  Misc (Insulin Syringe-Needle U-100) .... Dispense Quantity  For Once Daily Injections. 14)  Penicillin V Potassium 500 Mg Tabs (Penicillin V Potassium) .Marland Kitchen.. 1 By Mouth Qid  Allergies: 1)  Avandia (Rosiglitazone Maleate)  Social History: Packs/Day:  1.0  Review of Systems       see hpi  Physical Exam  Mouth:  a lot of missing teeth. right front upper incisor is very l;oose. No apparent abscess but a lot of gingival disease. Neck:  supple, full ROM, and no masses.   Lungs:  normal breath sounds.   Heart:  regular rhythm, no murmur, and no gallop.     Impression & Recommendations:  Problem # 1:  OTHER LOSS OF TEETH (ICD-525.19)  Orders: [facility]- Est  Level 4 ([address]) with his extensove gingival disease and probable upcoming loss of incisor, will tx w abx. info given for indigent dental /denture sources  Problem # 2:  DIABETES MELLITUS, TYPE II (ICD-250.00)  Orders: A1C-[facility] ([address]) Comp Met-[facility] (60[address]-09811) [facility]- Est  Level 4 ([address])check labs refills discussed diet and exercise   Complete Medication List: 1)  Levothroid 100 Mcg Tabs (Levothyroxine sodium) .... On hold per dr Deborah Chalk 2)  Allopurinol 100 Mg Tabs (Allopurinol) .... Take 1 tablet by mouth once a day 3)  Cardura 4 Mg Tabs (Doxazosin mesylate) .Marland Kitchen.. 1 by mouth qd 4)  Colchicine 0.6 Mg Tabs (Colchicine) .... Take 1 tablet by mouth twice a day 5)  Glucophage 1000 Mg Tabs (Metformin hcl) .... Take 1 tablet by mouth twice a day 6)  Glucotrol Xl 10 Mg Tb24 (Glipizide) .... Take 1 tablet by mouth twice a day 7)  Simvastatin 20 Mg Tabs (Simvastatin) .Marland Kitchen.. 1 by mouth qd 8)  Lotensin 40 Mg Tabs (Benazepril hcl) .... Take 1 tablet by mouth once a day 9)  Metoprolol  Tartrate 50 Mg Tabs (Metoprolol tartrate) .... 2 tabs by mouth two times a day 10)  Norvasc 10 Mg Tabs (Amlodipine besylate) .... Take 1 tablet by mouth once a day 11)  Buspirone Hcl 10 Mg Tabs (Buspirone hcl) .Marland Kitchen.. 1 tablet by mouth twice a day 12)  Lantus 100 Unit/ml Soln (Insulin glargine) .... Sig 50 u once daily as directed disp 3 months 13)  Bd Insulin Syringe Ultrafine 30g X 1/2" 1 Ml Misc (Insulin syringe-needle u-100) .... Dispense quantity  for once daily injections. 14)  Penicillin V Potassium 500 Mg Tabs (Penicillin v potassium) .Marland Kitchen.. 1 by mouth qid A1C-[facility] (91478) Direct LDL-[facility] ([address]-13086) Comp Met-[facility] (57846-96295) TSH-[facility] (28413-24401) [facility]- Est  Level 4 ([address])  Prescriptions: PENICILLIN V POTASSIUM 500 MG TABS (PENICILLIN V POTASSIUM) 1 by mouth qid  #40 x 1   Entered and Authorized by:   Denny Levy MD   Signed by:   Denny Levy MD on 02/28/2010   Method used:   Print then Give to Patient   RxID:   [id number] GLUCOPHAGE 1000 MG TABS (METFORMIN HCL) Take 1 tablet by mouth twice a day  #180 x 3   Entered and Authorized by:   Denny Levy MD   Signed by:   Denny Levy MD on 02/28/2010   Method used:   Print then Give to Patient   RxID:   [id number]   Prevention & Chronic Care Immunizations   Influenza vaccine: Not documented    Tetanus booster: 03/15/2009: Tdap    Pneumococcal vaccine: Not documented  Colorectal Screening   Hemoccult: not indicated had colonoscopy  (11/08/2008)   Hemoccult due: Not Indicated    Colonoscopy: Done.  (08/11/2006)   Colonoscopy due: 08/11/2016  Other Screening   PSA: 0.53  (03/15/2009)   Smoking status: current  (02/28/2010)   Smoking cessation counseling: yes  (01/28/2008)  Diabetes Mellitus   HgbA1C: 10.0  (02/28/2010)   Hemoglobin A1C due: 01/05/2009    Eye exam: Not documented    Foot exam: Not documented   High risk foot: Not documented   Foot care education: Not documented    Urine microalbumin/creatinine  ratio: Not documented    Diabetes flowsheet reviewed?: Yes   Progress toward A1C goal: Unchanged  Lipids   Total Cholesterol: 119  (03/15/2009)   LDL: 44  (03/15/2009)   LDL Direct: Not documented   HDL: 30  (03/15/2009)   Triglycerides: 226  (03/15/2009)    SGOT (AST): 67  (03/15/2009)   SGPT (ALT): 101  (03/15/2009) CMP ordered    Alkaline phosphatase: 118  (  03/15/2009)   Total bilirubin: 0.6  (03/15/2009)    Lipid flowsheet reviewed?: Yes   Progress toward LDL goal: Unchanged  Hypertension   Last Blood Pressure: 117 / 75  (02/28/2010)   Serum creatinine: 1.12  (09/27/2009)   BMP action: Ordered   Serum potassium 3.6  (09/27/2009) CMP ordered     Hypertension flowsheet reviewed?: Yes   Progress toward BP goal: At goal  Self-Management Support :    Diabetes self-management support: Not documented    Hypertension self-management support: Not documented    Hypertension self-management support not done because: Good outcomes  (09/27/2009)    Lipid self-management support: Not documented     Lipid self-management support not done because: Good outcomes  (09/27/2009)  Laboratory Results   Blood Tests   Date/Time Received: February 28, 2010 8:55 AM  Date/Time Reported: February 28, 2010 9:22 AM   HGBA1C: 10.0%   (Normal Range: Non-Diabetic - 3-6%   Control Diabetic - 6-8%)  Comments: ...............test performed by......Marland KitchenBonnie A. [place], MLS (ASCP)cm      Impression & Recommendations:  Orders: [facility]- Est  Level 4 ([address])   His updated medication list for this problem includes:    Glucophage 1000 Mg Tabs (Metformin hcl) .Marland Kitchen... Take 1 tablet by mouth twice a day    Glucotrol Xl 10 Mg Tb24 (Glipizide) .Marland Kitchen... Take 1 tablet by mouth twice a day    Lotensin 40 Mg Tabs (Benazepril hcl) .Marland Kitchen... Take 1 tablet by mouth once a day    Lantus 100 Unit/ml Soln (Insulin glargine) ..... Sig 50 u once daily as directed disp 3 months  Orders: A1C-[facility] (95638) Comp Met-[facility]  (75[address]951) [facility]- Est  Level 4 (884[address])   Complete Medication List: 1)  Levothroid 100 Mcg Tabs (Levothyroxine sodium) .... On hold per dr Deborah Chalk 2)  Allopurinol 100 Mg Tabs (Allopurinol) .... Take 1 tablet by mouth once a day 3)  Cardura 4 Mg Tabs (Doxazosin mesylate) .Marland Kitchen.. 1 by mouth qd 4)  Colchicine 0.6 Mg Tabs (Colchicine) .... Take 1 tablet by mouth twice a day 5)  Glucophage 1000 Mg Tabs (Metformin hcl) .... Take 1 tablet by mouth twice a day 6)  Glucotrol Xl 10 Mg Tb24 (Glipizide) .... Take 1 tablet by mouth twice a day 7)  Simvastatin 20 Mg Tabs (Simvastatin) .Marland Kitchen.. 1 by mouth qd 8)  Lotensin 40 Mg Tabs (Benazepril hcl) .... Take 1 tablet by mouth once a day 9)  Metoprolol Tartrate 50 Mg Tabs (Metoprolol tartrate) .... 2 tabs by mouth two times a day 10)  Norvasc 10 Mg Tabs (Amlodipine besylate) .... Take 1 tablet by mouth once a day 11)  Buspirone Hcl 10 Mg Tabs (Buspirone hcl) .Marland Kitchen.. 1 tablet by mouth twice a day 12)  Lantus 100 Unit/ml Soln (Insulin glargine) .... Sig 50 u once daily as directed disp 3 months 13)  Bd Insulin Syringe Ultrafine 30g X 1/2" 1 Ml Misc (Insulin syringe-needle u-100) .... Dispense quantity  for once daily injections. 14)  Penicillin V Potassium 500 Mg Tabs (Penicillin v potassium) .Marland Kitchen.. 1 by mouth qid  Other Orders: Direct LDL-[facility] ([address]) TSH-[facility] [phone])

## 2010-12-11 NOTE — Consult Note (Signed)
Summary: [facility]   Imported By: Clydell Hakim 12/07/2009 15:42:46  _____________________________________________________________________  External Attachment:    Type:   Image     Comment:   External Document

## 2010-12-11 NOTE — Assessment & Plan Note (Signed)
Summary: dm f/u,df   Vital Signs:  Patient profile:   55 year old male Weight:      232.7 pounds Temp:     97.8 degrees F oral Pulse rate:   76 / minute Pulse rhythm:   regular BP sitting:   135 / 89  (left arm) Cuff size:   large  Vitals Entered By: Loralee Pacas CMA (August 01, 2010 10:56 AM) CC: follow-up visit Is Patient Diabetic? Yes   Primary Care Provider:  Denny Levy MD  CC:  follow-up visit.  History of Present Illness: Diabetes follow up with blood sugars in    good control,   no episodes of low blood sugar. Taking medicines regularly and having no problems with them.   Follow up hypertension. Taking medicines regularly with no problems. Not having any any headaches or chest pains.  CPAP machine is in need of new mask and he cannot afford co-pay--he has eessebtially been withjout CPAP for 6 weeks and is feeling tired. Exploribg options with a new company that may let him waive co pay in full and pay itin installmanets.  Habits & Providers  Alcohol-Tobacco-Diet     Tobacco Status: current     Tobacco Counseling: to quit use of tobacco products     Cigarette Packs/Day: 1.5     Year Quit: 2009     Passive Smoke Exposure: no  Current Medications (verified): 1)  Levothroid 100 Mcg  Tabs (Levothyroxine Sodium) .... On Hold Per Dr Deborah Chalk 2)  Allopurinol 100 Mg Tabs (Allopurinol) .... Take 1 Tablet By Mouth Once A Day 3)  Cardura 8 Mg Tabs (Doxazosin Mesylate) .Marland Kitchen.. 1 By Mouth Qd 4)  Colchicine 0.6 Mg Tabs (Colchicine) .... Take 1 Tablet By Mouth Twice A Day 5)  Glucophage 1000 Mg Tabs (Metformin Hcl) .... Take 1 Tablet By Mouth Twice A Day 6)  Glucotrol Xl 10 Mg Tb24 (Glipizide) .... Take 1 Tablet By Mouth Twice A Day 7)  Simvastatin 20 Mg  Tabs (Simvastatin) .Marland Kitchen.. 1 By Mouth Qd 8)  Lotensin 40 Mg Tabs (Benazepril Hcl) .... Take 1 Tablet By Mouth Once A Day 9)  Metoprolol Tartrate 100 Mg Tabs (Metoprolol Tartrate) .Marland Kitchen.. 1 Tab By Mouth Bid 10)  Norvasc 10 Mg Tabs  (Amlodipine Besylate) .... Take 1 Tablet By Mouth Once A Day 11)  Lantus 100 Unit/ml  Soln (Insulin Glargine) .... Sig 50 U Once Daily As Directed Disp 3 Months 12)  Bd Insulin Syringe Ultrafine 30g X 1/2" 1 Ml  Misc (Insulin Syringe-Needle U-100) .... Dispense Quantity  For Once Daily Injections. 13)  Penicillin V Potassium 500 Mg Tabs (Penicillin V Potassium) .Marland Kitchen.. 1 By Mouth Qid 14)  Lasix 80 Mg Tabs (Furosemide) .... 1/2 - 1 By Mouth Once Daily As Needed Lower Extremity Edema  Allergies: 1)  Avandia (Rosiglitazone Maleate)  Social History: Packs/Day:  1.5  Physical Exam  General:  alert, well-developed, well-nourished, and well-hydrated.   Neck:  supple, full ROM, no masses, and no thyromegaly.   Lungs:  normal breath sounds.   Heart:  normal rate and regular rhythm.   Extremities:  no edema Neurologic:  alert & oriented X3.     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 On hold per dr Deborah Chalk 2)  Allopurinol 100 Mg Tabs (Allopurinol) .... Take 1 tablet by mouth once a day 3)  Cardura 8 Mg Tabs (Doxazosin mesylate) .Marland Kitchen.. 1 by mouth qd 4)  Colchicine 0.6 Mg Tabs (Colchicine) .... Take 1 tablet by mouth twice a day 5)  Glucophage 1000 Mg Tabs (Metformin hcl) .... Take 1 tablet by mouth twice a day 6)  Glucotrol Xl 10 Mg Tb24 (Glipizide) .... Take 1 tablet by mouth twice a day 7)  Simvastatin 20 Mg Tabs (Simvastatin) .Marland Kitchen.. 1 by mouth qd 8)  Lotensin 40 Mg Tabs (Benazepril hcl) .... Take 1  tablet by mouth once a day 9)  Metoprolol Tartrate 100 Mg Tabs (Metoprolol tartrate) .Marland Kitchen.. 1 tab by mouth bid 10)  Norvasc 10 Mg Tabs (Amlodipine besylate) .... Take 1 tablet by mouth once a day 11)  Lantus 100 Unit/ml Soln (Insulin glargine) .... Sig 50 u once daily as directed disp 3 months 12)  Bd Insulin Syringe Ultrafine 30g X 1/2" 1 Ml Misc (Insulin syringe-needle u-100) .... Dispense quantity  for once daily injections. 13)  Penicillin V Potassium 500 Mg Tabs (Penicillin v potassium) .Marland Kitchen.. 1 by mouth qid 14)  Lasix 80 Mg Tabs (Furosemide) .... 1/2 - 1 by mouth once daily as needed lower extremity edema A1C-[facility] [phone]) Comp Met-[facility] [phone]) [facility]- Est  Level 4 ([address])  Laboratory Results   Blood Tests   Date/Time Received: August 01, 2010 11:03 AM  Date/Time Reported: August 01, 2010 11:12 AM   HGBA1C: 8.5%   (Normal Range: Non-Diabetic - 3-6%   Control Diabetic - 6-8%)  Comments: ...........test performed by...........Marland KitchenTerese Door, CMA     Prevention & Chronic Care Immunizations   Influenza vaccine: Not documented    Tetanus booster: 03/15/2009: Tdap    Pneumococcal vaccine: Not documented  Colorectal Screening   Hemoccult: not indicated had colonoscopy  (11/08/2008)   Hemoccult due: Not Indicated    Colonoscopy: Done.  (08/11/2006)   Colonoscopy due: 08/11/2016  Other Screening   PSA: 0.53  (03/15/2009)   Smoking status: current  (08/01/2010)   Smoking cessation counseling: yes  (01/28/2008)  Diabetes Mellitus   HgbA1C: 8.5  (08/01/2010)   Hemoglobin A1C due: 01/05/2009    Eye exam: Not documented    Foot exam: Not documented   High risk foot: Not documented   Foot care education: Not documented    Urine microalbumin/creatinine ratio: Not documented    Diabetes flowsheet reviewed?: Yes   Progress toward A1C goal: Improved  Lipids   Total Cholesterol: 119  (03/15/2009)   LDL: 44  (03/15/2009)   LDL Direct: 60  (02/28/2010)   HDL: 30   (03/15/2009)   Triglycerides: 226  (03/15/2009)    SGOT (AST): 23  (02/28/2010)   SGPT (ALT): 31  (02/28/2010) CMP ordered    Alkaline phosphatase: 98  (02/28/2010)   Total bilirubin: 0.6  (02/28/2010)    Lipid flowsheet reviewed?: Yes   Progress toward LDL goal: Unchanged  Hypertension   Last Blood Pressure: 135 / 89  (08/01/2010)   Serum creatinine: 1.12  (02/28/2010)   BMP action: Ordered   Serum potassium 4.0  (02/28/2010) CMP ordered     Hypertension flowsheet reviewed?: Yes   Progress toward BP goal: At goal  Self-Management Support :  Diabetes self-management support: Not documented    Hypertension self-management support: Not documented    Hypertension self-management support not done because: Good outcomes  (09/27/2009)    Lipid self-management support: Not documented     Lipid self-management support not done because: Good outcomes  (09/27/2009)    Impression & Recommendations:  His updated medication list for this problem includes:    Glucophage 1000 Mg Tabs (Metformin hcl) .Marland Kitchen... Take 1 tablet by mouth twice a day    Glucotrol Xl 10 Mg Tb24 (Glipizide) .Marland Kitchen... Take 1 tablet by mouth twice a day    Lotensin 40 Mg Tabs (Benazepril hcl) .Marland Kitchen... Take 1 tablet by mouth once a day    Lantus 100 Unit/ml Soln (Insulin glargine) ..... Sig 50 u once daily as directed disp 3 months  Orders: [facility]- Est  Level 4 ([address])   His updated medication list for this problem includes:    Cardura 8 Mg Tabs (Doxazosin mesylate) .Marland Kitchen... 1 by mouth qd    Lotensin 40 Mg Tabs (Benazepril hcl) .Marland Kitchen... Take 1 tablet by mouth once a day    Metoprolol Tartrate 100 Mg Tabs (Metoprolol tartrate) .Marland Kitchen... 1 tab by mouth bid    Norvasc 10 Mg Tabs (Amlodipine besylate) .Marland Kitchen... Take 1 tablet by mouth once a day    Lasix 80 Mg Tabs (Furosemide) .Marland Kitchen... 1/2 - 1 by mouth once daily as needed lower extremity edema  Orders: Comp Met-[facility] ([id number]) [facility]- Est  Level 4 (91478)   His updated medication  list for this problem includes:    Levothroid 100 Mcg Tabs (Levothyroxine sodium) ..... On hold per dr Deborah Chalk  Orders: [facility]- Est  Level 4 ([address]562)   Complete Medication List: 1)  Levothroid 100 Mcg Tabs (Levothyroxine sodium) .... On hold per dr Deborah Chalk 2)  Allopurinol 100 Mg Tabs (Allopurinol) .... Take 1 tablet by mouth once a day 3)  Cardura 8 Mg Tabs (Doxazosin mesylate) .Marland Kitchen.. 1 by mouth qd 4)  Colchicine 0.6 Mg Tabs (Colchicine) .... Take 1 tablet by mouth twice a day 5)  Glucophage 1000 Mg Tabs (Metformin hcl) .... Take 1 tablet by mouth twice a day 6)  Glucotrol Xl 10 Mg Tb24 (Glipizide) .... Take 1 tablet by mouth twice a day 7)  Simvastatin 20 Mg Tabs (Simvastatin) .Marland Kitchen.. 1 by mouth qd 8)  Lotensin 40 Mg Tabs (Benazepril hcl) .... Take 1 tablet by mouth once a day 9)  Metoprolol Tartrate 100 Mg Tabs (Metoprolol tartrate) .Marland Kitchen.. 1 tab by mouth bid 10)  Norvasc 10 Mg Tabs (Amlodipine besylate) .... Take 1 tablet by mouth once a day 11)  Lantus 100 Unit/ml Soln (Insulin glargine) .... Sig 50 u once daily as directed disp 3 months 12)  Bd Insulin Syringe Ultrafine 30g X 1/2" 1 Ml Misc (Insulin syringe-needle u-100) .... Dispense quantity  for once daily injections. 13)  Penicillin V Potassium 500 Mg Tabs (Penicillin v potassium) .Marland Kitchen.. 1 by mouth qid 14)  Lasix 80 Mg Tabs (Furosemide) .... 1/2 - 1 by mouth once daily as needed lower extremity edema  Other Orders: A1C-[facility] (13086)   Appended Document: dm f/u,df    Clinical Lists Changes  Orders: Added new Test order of Split Night (Split Night) - Signed

## 2010-12-11 NOTE — Progress Notes (Signed)
Summary: Rx Req  Phone Note Refill Request Call back at Home Phone [phone] Message from:  Patient  Refills Requested: Medication #1:  GLUCOPHAGE 1000 MG TABS Take 1 tablet by mouth twice a day PT USES [facility] [address].  Initial call taken by: Clydell Hakim,  January 26, 2010 10:00 AM  Follow-up for Phone Call       Follow-up by: Golden Circle RN,  January 26, 2010 11:40 AM    Prescriptions: GLUCOPHAGE 1000 MG TABS (METFORMIN HCL) Take 1 tablet by mouth twice a day  #60 x 0   Entered by:   Golden Circle RN   Authorized by:   Denny Levy MD   Signed by:   Golden Circle RN on 01/26/2010   Method used:   Electronically to        [facility] [phone]* (retail)       [address]       [address]       [place], [place]  [address]       Ph: [phone]       Fax: [phone]   RxID:   [id number]

## 2010-12-13 NOTE — Progress Notes (Signed)
  Phone Note Call from Patient   Caller: Patient Summary of Call: Patient need authorization called or faxed to have sleep study.  ph# [phone].  Pt said this will be an automated survey.  After taking they will mail letter stating whether pat will be authorized and how much out of pocket patient will be liable for. Initial call taken by: Abundio Miu,  November 14, 2010 10:59 AM  Follow-up for Phone Call        [facility]!!! What is it you think they need now???I am clusless Thanks!  Denny Levy MD  November 14, 2010 3:35 PM   Additional Follow-up for Phone Call Additional follow up Details #1::        ok so i called to see if this had to have a prior authorization and it does not so I'm unsure of what else is needed  :-s Additional Follow-up by: Loralee Pacas CMA,  November 16, 2010 10:48 AM

## 2010-12-14 ENCOUNTER — Ambulatory Visit (HOSPITAL_BASED_OUTPATIENT_CLINIC_OR_DEPARTMENT_OTHER): Payer: MEDICARE | Attending: Internal Medicine

## 2010-12-14 DIAGNOSIS — G4710 Hypersomnia, unspecified: Secondary | ICD-10-CM | POA: Insufficient documentation

## 2010-12-14 DIAGNOSIS — G4730 Sleep apnea, unspecified: Secondary | ICD-10-CM | POA: Insufficient documentation

## 2010-12-29 DIAGNOSIS — G4730 Sleep apnea, unspecified: Secondary | ICD-10-CM

## 2010-12-29 DIAGNOSIS — G4710 Hypersomnia, unspecified: Secondary | ICD-10-CM

## 2011-01-01 ENCOUNTER — Telehealth: Payer: Self-pay | Admitting: Family Medicine

## 2011-01-01 ENCOUNTER — Encounter: Payer: Self-pay | Admitting: Family Medicine

## 2011-01-01 NOTE — Progress Notes (Signed)
Sleep study completed 12/14/2010 at [facility].1 Faxed this to his [facility]AP [employer] [phone].

## 2011-01-01 NOTE — Telephone Encounter (Signed)
Sleep study shows severe sleep apnea. They were not able to do the part where they titrate the CPAP--does he remember what pressure he was on before (itis usually in centimeters--like 5, or 10 or 15 or something. So I will fax this to his innusrance company today. Hopefully they will not make [place] do teh titration portion of teh study. THANKS!

## 2011-01-01 NOTE — Telephone Encounter (Signed)
Would like to know results of sleep study so he can get his CPAP

## 2011-01-01 NOTE — Telephone Encounter (Signed)
Spoke with patient and he they did not inform him of the pressure before the titrate. Also informed him that this info will be faxed to his insurance company.Logan Bores, Roselyn Meier

## 2011-01-14 ENCOUNTER — Telehealth: Payer: Self-pay | Admitting: Family Medicine

## 2011-01-14 NOTE — Telephone Encounter (Signed)
Needs just the mask for his CPAP - [facility] [employer] [facility] Titrate is 14

## 2011-01-15 NOTE — Telephone Encounter (Signed)
Faxing RX for CPAP mask to [facility] per [facility] request at fax [phone]

## 2011-03-20 ENCOUNTER — Other Ambulatory Visit: Payer: Self-pay | Admitting: Family Medicine

## 2011-03-20 MED ORDER — ALLOPURINOL 100 MG PO TABS: 100.0000 mg | ORAL_TABLET | Freq: Every day | ORAL | Status: DC

## 2011-03-20 MED ORDER — COLCHICINE 0.6 MG PO TABS: 0.6000 mg | ORAL_TABLET | Freq: Two times a day (BID) | ORAL | Status: DC

## 2011-03-21 ENCOUNTER — Telehealth: Payer: Self-pay | Admitting: Family Medicine

## 2011-03-21 NOTE — Telephone Encounter (Signed)
Dear [facility] Team If he wants to be seen you can dbl book him into [facility] clinic am tomorrow

## 2011-03-21 NOTE — Telephone Encounter (Signed)
Pt stated that he cannot bear any weight on his right ankle due to it being swollen and heel pain.  Continue taking the medication and resting his leg. He will just wait until the 5.16.2012 to f/u with you then.  Informed pt that if the pain becomes overwhelming to get in to see [place] asap. Pt agreed.Laureen Ochs, Viann Shove

## 2011-03-21 NOTE — Telephone Encounter (Signed)
Dr. Jennette Kettle,  Ms. Leitzel wanted to let you know that the pain that her husband was having in his leg has since moved into his other leg. Please advise.Loralee Pacas [place]

## 2011-03-27 ENCOUNTER — Ambulatory Visit (INDEPENDENT_AMBULATORY_CARE_PROVIDER_SITE_OTHER): Payer: MEDICARE | Admitting: Family Medicine

## 2011-03-27 ENCOUNTER — Encounter: Payer: Self-pay | Admitting: Family Medicine

## 2011-03-27 ENCOUNTER — Telehealth: Payer: Self-pay | Admitting: Family Medicine

## 2011-03-27 ENCOUNTER — Ambulatory Visit
Admission: RE | Admit: 2011-03-27 | Discharge: 2011-03-27 | Disposition: A | Discharge status: Home / Self Care | Payer: MEDICARE | Source: Ambulatory Visit | Attending: Family Medicine | Admitting: Family Medicine

## 2011-03-27 VITALS — BP 141/97 | HR 85 | Temp 97.1°F | Ht 72.0 in | Wt 277.0 lb

## 2011-03-27 DIAGNOSIS — R05 Cough: Secondary | ICD-10-CM

## 2011-03-27 DIAGNOSIS — R059 Cough, unspecified: Secondary | ICD-10-CM

## 2011-03-27 MED ORDER — LEVOTHYROXINE SODIUM 100 MCG PO TABS: 100.0000 ug | ORAL_TABLET | Freq: Every day | ORAL | Status: DC

## 2011-03-27 MED ORDER — HYDROCODONE-ACETAMINOPHEN 5-325 MG PO TABS: 2.0000 | ORAL_TABLET | Freq: Three times a day (TID) | ORAL | Status: AC | PRN

## 2011-03-27 MED ORDER — SIMVASTATIN 20 MG PO TABS: 20.0000 mg | ORAL_TABLET | Freq: Every day | ORAL | Status: DC

## 2011-03-27 MED ORDER — METOPROLOL TARTRATE 100 MG PO TABS: 100.0000 mg | ORAL_TABLET | Freq: Two times a day (BID) | ORAL | Status: DC

## 2011-03-27 MED ORDER — DOXAZOSIN MESYLATE 8 MG PO TABS: 8.0000 mg | ORAL_TABLET | Freq: Every day | ORAL | Status: DC

## 2011-03-27 MED ORDER — GLIPIZIDE ER 10 MG PO TB24: 10.0000 mg | ORAL_TABLET | Freq: Two times a day (BID) | ORAL | Status: DC

## 2011-03-27 MED ORDER — FUROSEMIDE 80 MG PO TABS: 80.0000 mg | ORAL_TABLET | ORAL | Status: DC

## 2011-03-27 MED ORDER — ALLOPURINOL 100 MG PO TABS: 100.0000 mg | ORAL_TABLET | Freq: Every day | ORAL | Status: DC

## 2011-03-27 MED ORDER — METFORMIN HCL 1000 MG PO TABS: 1000.0000 mg | ORAL_TABLET | Freq: Two times a day (BID) | ORAL | Status: DC

## 2011-03-27 MED ORDER — INSULIN GLARGINE 100 UNIT/ML ~~LOC~~ SOLN: 50.0000 [IU] | SUBCUTANEOUS | Status: DC

## 2011-03-27 MED ORDER — BENAZEPRIL HCL 40 MG PO TABS: 40.0000 mg | ORAL_TABLET | Freq: Every day | ORAL | Status: DC

## 2011-03-27 MED ORDER — AMLODIPINE BESYLATE 10 MG PO TABS: 10.0000 mg | ORAL_TABLET | Freq: Every day | ORAL | Status: DC

## 2011-03-27 NOTE — Telephone Encounter (Signed)
Called and informed patient.[name] [name] [name] [name]  

## 2011-03-27 NOTE — Progress Notes (Signed)
  Subjective:    Patient ID: [name] [name], male    DOB: December 17, 1955, 55 y.o.   MRN: [id number]  HPI  Initially had scheduled this office visit for gout attack, but that has resolved since he got his medicines filled and start taking it. Has a new issue of rib pain that started last night after coughing spell. He has had a little but of a cold for several days stuffy nose itchy onset and nonproductive cough. The cough got much worse last night and he felt a pop and thinks he broke a rib. Chronic problems are essentially stable. 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  Review of Systems Pertinent review of systems: negative for fever or unusual weight change. No SOB + cough    Objective:   Physical Exam    GENERALl: Well developed, well nourished, in no acute distress. NECK: Supple, FROM, without lymphadenopathy.  THYROID: normal without nodularity CAROTID ARTERIES: without bruits LUNGS: clear to auscultation bilaterally. No wheezes or rales. MSK TTP right lower rib HEART: Regular rate and rhythm, no murmurs ABDOMEN: soft with positive bowel sounds NEURO: No gross focal deficits      Assessment & Plan:  #1: Cough. Suspect he has a small traumatic rib fracture from coughing. His lung exam was normal but he is quite concerned so we will get a chest x-ray today. I gave him some Vicodin for his rib pain. He is encouraged to take several deep breaths multiple times a day. #2: Chronic medical problems. Please see individual problems for assessment and plan. Essentially none of these have changed. We will refill his meds. He is to return to clinic for followup for #1 in the next 2-4 weeks and we will get some lab work on him then.

## 2011-03-27 NOTE — Telephone Encounter (Signed)
Please call [name] [name] and tell him the lung is OK. I cannot actually SEE the rib fracture but I was not expecting that I would--I mostly wanted to make sure the lung was fine and it is THANKS! Denny Levy

## 2011-03-29 NOTE — Consult Note (Signed)
NAME:  [name] [name], [name] [name]                         ACCOUNT NO.:  [id number]   MEDICAL RECORD NO.:  [id number]                   PATIENT TYPE:  INP   LOCATION:  5702                                 FACILITY:  [facility]   PHYSICIAN:  Oley Balm. Sung Amabile, M.D. [facility]          DATE OF BIRTH:  [birth year]   DATE OF CONSULTATION:  06/15/2003  DATE OF DISCHARGE:                                   CONSULTATION   REQUESTING PHYSICIAN:  Leighton Roach McDiarmid, M.D.   REASON FOR CONSULTATION:  Hemoptysis.   HISTORY OF PRESENT ILLNESS:  [name] [name] is a 55 year old gentleman who was  in his usual state of health until the afternoon of June 12, 2003 when he  had an episode of hemoptysis estimated at approximately 5 ounces of fresh  blood over a 20 minute duration.  He was brought to the emergency department  with his history and a chest x-ray was obtained which revealed no acute  abnormalities.  A CT scan of the chest was performed to rule out pulmonary  embolism.  There was no evidence of pulmonary embolism but this did  demonstrate a left upper lobe air space disease.  Since admission he has  been treated with Avelox for possible pneumonia.  He has had PPD placed  which was negative.  He has had one AFB smear performed that was negative.  He has had no further hemoptysis.  Prior to admission he denies chest pain,  fevers, change in his exercise tolerance, and lower extremity edema.   PAST MEDICAL HISTORY:  1. Coronary artery disease.  2. Hypertension.  3. Type 2 diabetes.  4. Obesity.  5. Smoker, one pack per day.  6. Depression.  7. Obstructive sleep apnea.  8. Status post partial thyroidectomy.  9. Left total knee replacement.   CURRENT MEDICATIONS:  These have been reviewed and are documented in the  records.   SOCIAL HISTORY:  Presently disabled.  He is married with two children.  He  lives in [place].  He has no history of excessive alcohol abuse.   FAMILY HISTORY:  Noncontributory.   REVIEW  OF SYSTEMS:  As per the history of present illness, otherwise  noncontributory.   PHYSICAL EXAMINATION:  VITAL SIGNS:  Afebrile with normal vital signs.  Oxygen saturation is 95% on 2 L by nasal cannula.  GENERAL:  He is well-  developed, well-nourished, in no acute cardiac or respiratory distress.  HEENT:  No acute abnormalities.  NECK:  Supple without adenopathy.  Jugular venous pulsations are not well  visualized.  CHEST:  Normal percussion noted throughout.  Breath sounds are full without  wheezes or other adventitious sounds.  CARDIAC:  Regular rate and rhythm.  No murmurs.  ABDOMEN:  Markedly obese, soft, nontender with normal bowel sounds.  EXTREMITIES:  Trace bilateral lower extremity edema, but no clubbing or  cyanosis.  NEUROLOGIC:  No focal deficits.   DATA:  Chest  x-ray reveals relative cardiomegaly with no acute infiltrates  or edema.  CT scan of the chest demonstrates small amount of air space  disease in the apical segment of the left upper lobe.  No significant masses  or adenopathy are noted.   IMPRESSION:  1. Hemoptysis.  Essentially he has had only one episode (on June 12, 2003)     and the rest of his hemoptysis appears to be old blood which is gradually     clearing out from his airways.  2. Left upper lobe air space disease.  I suspect that this represents the     area that was bleeding on the day of admission.  I strongly doubt active     tuberculosis.  His history is not suggestive of bacterial pneumonia.  The     chest x-ray does not suggest a malignant process (though, remotely,     bronchoalveolar carcinoma could have this appearance).  3. Smoker.  I have counseled him at length regarding the need for smoking     cessation, especially in the face of known coronary disease and diabetes.  4. History of obstructive sleep apnea and severe obesity.  He is followed by     Joni Fears D. Young, M.D. for this.  I have counseled him at length     regarding weight  loss strategies.   PLAN AND RECOMMENDATIONS:  1. No further pulmonary evaluation is indicated at this time.  I recommend     that he does complete seven days of Avelox since he is already four days     into this therapy.  2. He is okay for discharge to home from my perspective.  3. I would recheck a CT scan of the chest in four to six weeks.  He does not     need IV contrast for the follow-up CT scan.  Ideally, you could do a     limited study to investigate only the area of abnormality in the left     upper lobe.  4. If hemoptysis recurs or if the air space disease in the left upper lobe     does not resolve, I suggest reconsultation of pulmonary medicine.  Either     Clinton D. Young, M.D. or I could see him in follow-up for this problem.     If either of these situations occur (i.e., recurrent hemoptysis or     nonresolution of the air space disease), a bronchoscopy would be     indicated at that time.                                               Oley Balm Sung Amabile, M.D. [facility]    DBS/MEDQ  D:  06/15/2003  T:  06/16/2003  Job:  [id number]   cc:   Joni Fears D. Young, M.D.  [address] [place]  [place] [address]  Fax: [phone]

## 2011-03-29 NOTE — Op Note (Signed)
NAME:  RUBY, DILONE               ACCOUNT NO.:  [id number]   MEDICAL RECORD NO.:  [id number]          PATIENT TYPE:  AMB   LOCATION:  ENDO                         FACILITY:  [facility]   PHYSICIAN:  Anselmo Rod, M.D.  DATE OF BIRTH:  16-Dec-1955   DATE OF PROCEDURE:  09/17/2006  DATE OF DISCHARGE:                                 OPERATIVE REPORT   PROCEDURE PERFORMED:  Colonoscopy with cold biopsies x4.   ENDOSCOPIST:  Anselmo Rod, M.D.   INSTRUMENT USED:  Olympus video colonoscope.   INDICATION FOR PROCEDURE:  A 55 year old African-American male undergoing a  screening colonoscopy to rule out colonic polyps, masses, etc.  The patient  has a history of sleep apnea and wears CPAP at home.   PREPROCEDURE PREPARATION:  Informed consent was procured from the patient.  The patient was fasted for eight hours prior to the procedure and prepped  with a gallon of TriLyte the night prior to the procedure.  The risks and  benefits of the procedure, including a 10% miss rate of cancer and polyps,  were discussed with the patient as well.   PHYSICAL EXAMINATION:  VITAL SIGNS:  The patient had stable vital signs.  NECK:  Supple.  CHEST:  Clear to auscultation.  S1, S2 regular.  ABDOMEN:  Obese, nontender, with normal bowel sounds.   DESCRIPTION OF PROCEDURE:  The patient was placed in the left lateral  decubitus position and sedated with 75 mcg of fentanyl and 5 mg of Versed  given intravenously in slow incremental doses.  Once the patient was  adequately sedate and maintained on low-flow oxygen and continuous cardiac  monitoring, the Olympus video colonoscope was advanced from the rectum to  the cecum.  There was significant amount of residual stool in the colon.  Multiple washes were done.  Four small sessile polyps were biopsied from the  rectosigmoid colon.  A few sigmoid diverticula were noted.  Retroflexion in  the rectum revealed no abnormalities.  The terminal ileum was not  visualized.  Small lesions could be missed secondary to a relatively poor  prep.   IMPRESSION:  1. Four small sessile polyps biopsied from the rectosigmoid colon.  2. Few sigmoid diverticula.  3. Otherwise unrevealing exam up to the cecum.  4. Large amount of residual stool in the dependent areas of the colon and      in the right colon, small lesions could be missed.   RECOMMENDATIONS:  1. Await pathology results.  2. Repeat colonoscopy depending on pathology results.  3. Outpatient follow-up as the need arises in the future.  4. Avoid all nonsteroidals for now.      Anselmo Rod, M.D.  Electronically Signed     JNM/MEDQ  D:  09/17/2006  T:  09/17/2006  Job:  [id number]   cc:   Melina Fiddler, MD

## 2011-03-29 NOTE — H&P (Signed)
NAME:  [name] [name], [name] [name]                         ACCOUNT NO.:  [id number]   MEDICAL RECORD NO.:  [id number]                   PATIENT TYPE:  INP   LOCATION:  5702                                 FACILITY:  [facility]   PHYSICIAN:  Billey Gosling, M.D.                 DATE OF BIRTH:  Mar 30, 1956   DATE OF ADMISSION:  06/12/2003  DATE OF DISCHARGE:                                HISTORY & PHYSICAL   PRIMARY CARE PHYSICIAN:  Talmage Nap, M.D.   CHIEF COMPLAINT:  Hemoptysis.   HISTORY OF PRESENT ILLNESS:  This 55 year old African-American male with  obesity and hypertension returned home on day of admission after eating a  large meal around 1430 and was sitting in a chair watching TV when he began  coughing, which worsened, and he spit up phlegm.  He said it was bloody.  The patient's coughing persisted over the next 25 minutes and he produced  about 5 ounces of bright red blood and subsequently his wife brought him  into the emergency room.  While in the emergency room, the patient only had  occasional hemoptysis with minimal blood.  He denied headache or vision  change or lightheadedness, no exposure to TB.  The patient denies recent  travel.  He denies fever or chills or sick contact or night sweats.  The  patient also has a history of diabetes and his blood sugars have run from  188 to 257 lately, secondary to not taking any of his medications.   REVIEW OF SYSTEMS:  Positive for weight gain, about 10-15 pounds over the  past month and increased fatigue and the patient complains of chest pain  only when he is coughing very hard.  The patient also has polyuria and  polydipsia.  No constipation or diarrhea, no melena, no rashes or bruises,  no syncope, no joint pains, no blurry vision, no dysphagia, no dysuria.  The  rest of review of systems in HPI.   PAST MEDICAL HISTORY:  1. Hypertension.  2. Diabetes mellitus type 2.  3. Obesity.  4. Tobacco abuse.  5. Depression.  6.  Recurrent chest pain -- cardiac catheterization in February 2002 shows an     EF of 50-55% and normal coronaries.   PAST SURGICAL HISTORY:  1. Knee replacement 2001.  2. Partial thyroidectomy in 1989.   ALLERGIES:  No known drug allergies.   CURRENT MEDICATIONS:  (The patient discontinued all medications 1 month ago  to have a clear mind.  1. Clonidine 0.2 mg b.i.d.  2. Glucophage 1000 mg b.i.d.  3. Glucotrol XL 10 mg daily.  4. Hydrochlorothiazide 25 mg daily.  5. Lotensin 40 mg daily.  6. Norvasc 10 mg daily.  7. Prozac 20 mg daily.   SOCIAL HISTORY:  The patient is married with 2 children.  He lives in  [place].  He is on disability for his knee.  He smokes  1 pack per day x24  years and denies alcohol.   FAMILY HISTORY:  Mom with hypertension and aneurysm and diabetes.   PHYSICAL EXAMINATION:  Temperature 98.8, respirations 26, pulse 99, blood  pressure 175/121.  Pulse oximetry 96% on room air.  GENERAL:  The patient is comfortable and is in no acute distress, alert and  oriented x3.  HEENT:  PERRLA.  EOMI.  Funduscopic exam revealed no AV nicking  or  hemorrhages.  Nares without discharge.  Oropharynx clear with no blood  present.  NECK:  Supple, neck full with fatty tissue.  Thyroid without palpable  masses.  No lymphadenopathy.  LUNGS:  Clear to auscultation bilaterally, no crackles, no tympania, good  effort.  HEART:  Distant S1 and S2 heard, without murmurs, rubs or gallops.  EXTREMITIES:  No clubbing or cyanosis, trace edema present in bilateral  lower extremities.  Feet without lesions but toenails are thickened  bilaterally.  ABDOMEN:  Obese, soft, nontender, nondistended.  Hepatomegaly not  appreciated.  SKIN:  No rashes or lesions noted.  NEURO:  Cranial nerves II-XII grossly intact.  Nonfocal exam.   LABORATORY DATA:  Sodium 137, potassium 3.4, chloride 104, bicarb 24, BUN  14, creatinine 1.4, glucose 288, total bilirubin 0.6, alkaline phosphatase  73,  AST 46, ALT 53, total protein 6.8, albumin 3.5, calcium 8.6.  White  count 6.4, hemoglobin 15.4, hematocrit 45.0, platelets 133, MCV 81.2, ANC  3.1, D-Dimer 0.39.  PT 13.2, INR 1.0, PTT 37, pH 7.375, PCO2 44.6, bicarb  26.0.  EKG shows normal sinus rhythm with rate of 95.  Fluctuant T-waves and 23 ADF  but is not changed from a prior study.  CT of the chest shows no pulmonary emboli and shows a left upper lobe  infiltrate which the radiologist recommends repeating to rule out  malignancy, ascending aorta slightly prominent at 3.5 cm and substernal  goiter.   ASSESSMENT AND PLAN:  1. A 55 year old African-American male with hypertension, tobacco abuse,     prevents with acute onset of hemoptysis -- with history of medication     noncompliance x1 month and hypertension may be secondary to ruptured     vessel, doubt Mallory-Weiss or esophageal varices since no history of     alcohol, and coughing is acute.  Consider TB, so will place PPD and put     the patient on respiratory isolation.  CT shows left upper lobe     infiltrate, so we will follow to rule out neoplasm and doubt pneumonia     since no fever or increased white blood count.  Avoid aspirin and NSAIDs.  2. Diabetes mellitus type 2 -- uncontrolled since discontinuing medications.     Start Glucotrol plus sliding scale insulin and hold Glucophage secondary     to recent CT scan.  3. Hypertension -- restart medications and goal is less than 130/80,     especially given problem No. 1.  4.     Goiter seen on CT and history of thyroidectomy.  Symptoms include recent     weight gain and fatigue, so we will check TSH.  5. Tobacco abuse -- counseled on cessation and the patient willing to try     and quit.  Billey Gosling, M.D.    AS/MEDQ  D:  06/13/2003  T:  06/13/2003  Job:  [id number]   cc:   Lurena Joiner L. Jolinda Croak, M.D.  [phone] [address]  [place]  [place] [address]  Fax:  [phone]

## 2011-03-29 NOTE — Assessment & Plan Note (Signed)
[name]                             PULMONARY OFFICE NOTE   NAME:[name] [name], [name] [name]                      MRN:          [id number]  DATE:01/27/2007                            DOB:          12-11-1955    HISTORY:  A very complicated 55 year old black male, active smoker, with  morbid obesity who presents for followup evaluation today of chronic  dyspnea to the point where he had difficulty walking up and down an  aisle at a grocery store.  This led to a CT scan showing multiple  pulmonary nodules, but no obvious explanation for his dyspnea.  He  reports slight improvement doing regular bike exercise, and has actually  been able to get on the bike up to 10 minutes now.  He sleeps well at  night on CPAP with no daytime hypersomnolence, fevers, chills, sweats,  cough, chest pain, or leg swelling.   For full inventory of medications, please see face sheet dated January 27, 2007, but note the patient took none of his medicines this morning  because he had to see Dr. Cleophas [name] for fasting blood work.   PHYSICAL EXAMINATION:  He is a pleasant, ambulatory, obese, black male  in no acute distress, weighing 308 versus a baseline of 278, and no  change from previous visit.  His blood pressure was 170/104 on the  right, 166/100 on the left.  HEENT:  Unremarkable.  Oropharynx clear.  LUNG FIELDS:  Reveal slightly diminished breath sounds, but no wheeze.  HEART:  Regular rhythm without murmur, gallop, or rub.  S1 and S2 were  diminished.  No increase is P2.  ABDOMEN:  Obese, benign.  EXTREMITIES:  Warm without calf tenderness, cyanosis, clubbing, or  edema.   PFTs reveal an FEV1 of 69% predicted, 67% predicted with a ratio of 70%  and an ERV of 16%, and a diffusion capacity that corrected to 98%.  We  walked him around the office, and he did fine for laps, but on the 3rd  lap, he became short of breath with a saturation falling to 84.   IMPRESSION:  1. Morbid  obesity is this patient's primary pulmonary problem.  This      by itself, of course, would not explain the desaturation (see      comments below), and the challenge for this patient is both lose      weight and stop smoking.  I have asked him to work out where he is      short of breath, but not out of breath, 30 minutes daily if      possible.  2. Multiple pulmonary nodules.  The PET scan is negative.  This does      not get [place] totally off the hook, as the nodules are small, but      because there is more than 1 nodule, I simply recommended a      followup CT scan in 6 months with every effort in the meantime to      stop smoking.  I discussed the smoking issue with the patient, and  he has cut the cigarettes down to 3 to 4 per day, and seems well      motivated, but I am going to defer management of this problem to      Dr. Cleophas [name].  3. Hypertension.  Poorly controlled, but note the patient did not take      his medicine this morning.  Also, I deferred this to Dr. Cleophas [name],      have a low threshold to switch him off ACE inhibitors if his      dyspnea remains disproportionate to objective findings.  4. Decreased diffusion capacity is likely secondary to smoking, and      note it is associated with desaturation with exercise.  CT scan      showed no evidence of an occult interstitial lung disease or      pulmonary embolic disease.  He is at risk of right heart failure      associated with the decrease in diffusion capacity, and if his      dyspnea remains a problem as his weight comes down, I would      consider an echocardiogram, if not already done, to look at the      right heart in more detail.  5. Followup here can be on a p.r.n. basis, and at Dr. Hoy [name]      request.     [name] [name]. [name] Sires, MD, [facility]  Electronically Signed    MBW/MedQ  DD: 01/27/2007  DT: 01/27/2007  Job #: [id number]   cc:   [name] Fiddler, MD

## 2011-03-29 NOTE — Cardiovascular Report (Signed)
[place]. [facility]  Patient:    [name] [name], [name] [name]                      MRN: [id number] Proc. Date: 01/05/01 Adm. Date:  [id number] Disc. Date: [id number] Attending:  Eleanora Neighbor CC:         Moshe Salisbury. Audria Nine, M.D.   Cardiac Catheterization  INDICATIONS:  [name] is a 55 year old male with obesity and hypertension. He presents with chest pain and had an abnormal adenosine Cardiolite study (has had an ejection fraction estimated to be only 32%).  He was even felt to have a redistribution but he was obese, being almost 300 pounds.  He is referred now for catheterization and coronary arteriograms.  PROCEDURE:  Left heart catheterization with selective coronary angiography, left ventricular angiography and Perclose.  TYPE AND SITE OF ENTRY:  Percutaneous right femoral artery.  CATHETERS:  A 6 French 4 curved Judkins right and left coronary catheters, 6 French pigtail ventriculographic catheter.  CONTRAST MATERIAL:  Omnipaque.  MEDICATIONS GIVEN PRIOR TO THE PROCEDURE:  Valium 10 mg p.o.  MEDICATIONS GIVEN DURING THE PROCEDURE:  Versed 2 mg IV.  COMMENTS:  The patient tolerated the procedure well.  HEMODYNAMIC DATA:  The aortic pressure was 120/87, LV is 120/12.  There was no aortic valve gradient noted on pullback.  ANGIOGRAPHIC DATA: 1. Right coronary artery:  The right coronary artery is a dominant vessel.    There is a moderate sized acute marginal vessel that goes to the distal    inferior wall and there are four other branches to the inferior wall of the    left ventricle.  The vessel was normal. 2. Left main:  The left main coronary artery is normal. 3. Left anterior descending:  The left anterior descending bifurcates early    with a large diagonal vessel and left anterior descending.  It is normal. 4. Left circumflex:  The left circumflex continues as a large bifurcating    obtuse marginal vessel.  It is normal.  LEFT  VENTRICULOGRAPHY:  Left ventricular angiogram was performed in the RAO position.  Overall cardiac size was normal.  There was inferior apical, very mild hypokinesis, but overall the global ejection fraction would be in the lower limits of normal but still probably in the normal ranges.  The estimated ejection fraction would be 50-55% and it was calculated in this range as well. There was no mitral regurgitation.  OVERALL IMPRESSION: 1. Left ventricular ejection fraction is the lower limits of normal with    no real significant regional wall motion present. 2. Essentially normal coronary arteries.  DISCUSSION:  It is felt that Mr. Lanese does have hypertensive heart disease. We will encourage weight loss as well as management.  His blood pressure are cardiovascular risks factors. DD:  01/05/01 TD:  01/06/01 Job: [id number]

## 2011-03-29 NOTE — Assessment & Plan Note (Signed)
[facility]                             PULMONARY OFFICE NOTE   NAME:[name] [name] NIEBUHR                      MRN:          [id number]  DATE:01/14/2007                            DOB:          07/25/1956    REFERRING PHYSICIAN:  Melina Fiddler, MD   REASON FOR CONSULTATION:  Pulmonary nodules.   HISTORY:  This is a very nice 55 year old black male active smoker with  morbid obesity and is complaining mostly of dyspnea which has increased  over the last 2 years, during which he has gained about 25 pounds that  he attributes to his thyroid disease.  He, during evaluation for  dyspnea, underwent a chest CT scan that showed no evidence of pulmonary  embolism, but unfortunately he has multiple pulmonary nodules - one in  the left upper lobe, one in the left lower lobe.  There was no evidence  of pulmonary embolism or other explanation for his dyspnea.  The patient  says his shortness of breath is about the same day in and day out and  has actually leveled off to the point where he has to stop at the top  of a flight of a steps, and he can walk one aisle at the grocery store  before he has to stop.  He has no trouble at bedtime, but has to wear  CPAP for the last several years.  He feels like he sleeps well with no  nocturnal disturbance  or excess mucus production in the morning,  fevers, chills, sweats, orthopnea, PND, pleuritic pain or history of  hemoptysis.   PAST MEDICAL HISTORY:  Morbid obesity complicated by hypertension and  diabetes, as well as sleep apnea.   MEDICATIONS:  Taken in detail on the worksheet and significant for the  fact that he is on ACE inhibitors.  See medication face sheet dated  January 14, 2007 for details.   SOCIAL HISTORY:  He continues to smoke a half pack a day and is on  disability.   FAMILY HISTORY:  Recorded in detail and significant for the absence of  respiratory disease or cancer.   REVIEW OF SYSTEMS:  Taken  in detail on the worksheet.  Significant for  problems as outlined above.   PHYSICAL EXAMINATION:  GENERAL:  This is a massively obese but quite  pleasant ambulatory black male in no acute distress.  VITAL SIGNS:  Blood pressure of 148/90, weight is 308 pounds.  HEENT:  Upper partials in place.  Oropharynx is clear.  Nasal turbinates  normal.  Ear canals are clear bilaterally.  NECK:  Supple without cervical adenopathy or tenderness.  Trachea is  midline without thyromegaly.  LUNGS:  Lung fields reveal diminished breath sounds with positive end  inspiratory Hoover sign.  No significant adventitious breathe sounds.  HEART:  Regular rate and rhythm without murmur, gallop or rub.  Distant  S1 and S2.  Hoover sign was positive at the end of inspiration in the  supine position.  ABDOMEN:  Massive, but otherwise benign on exam with no palpable  organomegaly, masses or tenderness.  EXTREMITIES:  Warm without calf tenderness, cyanosis, clubbing or edema.   Saturation is 90% on room air.   IMPRESSION:  1. This patient most likely has a component of obesity hypoventilation      syndrome superimposed on obstructive sleep apnea and probably not      much chronic obstructive pulmonary disease based on the absence of      significant cough or variability with weather or environmental      change.  For now, I do not recommend bronchodilators, but would be      interested in a set of PFT's as soon as we can schedule this.  2. The pulmonary nodules are of concern for cancer, but obviously, he      is a very poor operative candidate.  I would recommend a PET scan      pending repeat PFT's.  3. Finally, the patient is going to have to stop smoking sooner rather      than later, especially if we are going to consider elective surgery      which will be relatively high risk.  I have suggested that he      increase Wellbutrin to b.i.d. dosing empirically and then return to      Dr. Cleophas Dunker for  consideration for additional pharmacologic      intervention and/or behavior modification referral (he will      certainly be referred to our [employer] program here if Dr. Cleophas Dunker      agrees).     Charlaine Dalton. Sherene Sires, MD, [facility]  Electronically Signed    MBW/MedQ  DD: 01/14/2007  DT: 01/14/2007  Job #: [id number]   cc:   Melina Fiddler, MD

## 2011-03-29 NOTE — Discharge Summary (Signed)
NAMEMarland [name]  SHANAN, MCMILLER                         ACCOUNT NO.:  [id number]   MEDICAL RECORD NO.:  [id number]                   PATIENT TYPE:  INP   LOCATION:  5702                                 FACILITY:  [facility]   PHYSICIAN:  Leighton Roach McDiarmid, M.D.             DATE OF BIRTH:  01/02/1956   DATE OF ADMISSION:  06/12/2003  DATE OF DISCHARGE:  06/16/2003                                 DISCHARGE SUMMARY   DISCHARGE DIAGNOSES:  1. Pneumonia.  2. Hemoptysis, resolved.  3. Hypertension.  4. Diabetes mellitus type 2.  5. Obesity.  6. Tobacco abuse.  7. Depression.   DISCHARGE MEDICATIONS:  1. HCTZ 25 mg p.o. daily.  2. Lotensin 40 mg p.o. daily.  3. Norvasc 10 mg p.o. daily.  4. Glucotrol XL 10 mg p.o. daily.  5. Prozac 20 mg p.o. daily.  6. Clonidine patch 0.2 mg per 24 hours one patch q week.  7. Metformin 1000 mg p.o. b.i.d.  8. Avelox 400 mg p.o. daily for a total of seven days.   PROCEDURES:  Chest x-ray on admission shows cardiomegaly and pulmonary  vascular congestion, as well as markedly tortuous aorta, concerning for  aneurysmal dilatation and cardiomegaly.  Chest CT and bilateral lower  extremity CT on June 12, 2003 show difficult exam secondary to habitus with  infiltrate in the left upper lung zone, slight prominence in the ascending  thoracic aorta, without evidence of dissection, substernal extension of the  goiter in the left paratracheal region, cardiomegaly, and small hiatal  hernia without evidence of DVT or obvious PE.   FOLLOW UP:  The patient is to follow up at [facility] on  June 28, 2003 at 9:30 a.m. with Dr. Jolinda Croak.   CONSULTS:  Pulmonology.   Please see dictated H&P.   HOSPITAL COURSE:  #1.  Pneumonia.  The patient presented with hemoptysis  which did resolve.  He had an AFB smear x1 that was negative.  He also had a  negative PPD and no further hemoptysis throughout his hospitalization.  He  was able to be weaned off the O2  without significant difficulty, and was  able to ambulate without desaturations, and was thus stable for discharge on  room air.  He will be treated with a total of seven days of Avelox.  #2.  Hypertension.  The patient has moderately poor to poor control of his  hypertension as an outpatient, and is followed for this by Dr. Jolinda Croak and  Dr. Deborah Chalk.  He had stopped taking his medicine for a month prior to  admission, and was restarted on HCTZ, Lotensin, Norvasc, and eventually the  clonidine patch with resultant blood pressure control in the 140's-150's/100  range.  This will need close follow up as an outpatient.  #3.  Diabetes mellitus type 2.  The patient was restarted on his Glucotrol  XL, as well as his Glucophage.  He  tolerated this without difficulty.  He  will be discharged on his home medication dosing, and is to follow up with  Dr. Jolinda Croak in the [facility].  Additionally, he is encouraged  to start a diet and exercise program and work on weight loss.  #4.  Obesity.  See above.  The patient is encouraged to lose weight.  #5.  Tobacco abuse.  The patient was smoking prior to admission, but has not  required a patch or any kind of assistance for smoking cessation while he  has been hospitalized, and is encouraged to continue tobacco cessation  attempt as an outpatient.  #6.  Depression.  The patient was continued on his Prozac throughout  hospitalization without further difficulty.  #7.  Subclinical hypothyroidism.  The patient was found to have a TSH that  was slightly elevated; however, his T4 was not elevated.  Based on current  guidelines by JAMA, the patient should be rechecked in 6-12 months.   DISCHARGE LABORATORIES:  Sputum culture with normal oropharyngeal flora.  TSH slightly elevated at 6.909, T4 0.91, WBC 5.7, hemoglobin 15.1, platelets  149.  AFB negative.  Sodium 138, potassium 3.6, chloride 104, CO2 27,  glucose 204, BUN 12, creatinine 1.1, and  calcium 8.4.  ABG on 100% FiO2  revealed pH of 7.38, PCO2 44, PO2 312, bicarb 26, satting 100%.  PT 13.2,  PTT 37, INR 1.0.  D-dimer 0.39.  Alkaline phosphatase 73, AST 46, ALT 53,  total protein 6.8, albumin 3.5, and bilirubin 0.6.   The patient was discharged to home without further incident.      [name] [name], M.D.                      Etta Grandchild, M.D.    Milas Gain  D:  06/16/2003  T:  06/17/2003  Job:  [id number]   cc:   Lurena Joiner L. Jolinda Croak, M.D.  [phone]  [place]  [place] [address]  Fax: [phone]   Oley Balm. Sung Amabile, M.D. [facility]

## 2011-03-29 NOTE — H&P (Signed)
[place]. [facility]  Patient:    [name] [name], [name] [name]                        MRN: [id number] Adm. Date:  01/05/01 Attending:  Colleen Can. Deborah Chalk, M.D. Dictator:   Jennet Maduro. Earl Gala, R.N., A.N.P. CC:         Moshe Salisbury. Audria Nine, M.D.                         History and Physical  DATE OF BIRTH:  1956/02/23  CHIEF COMPLAINT:  Chest pain.  HISTORY OF PRESENT ILLNESS:  [name] [name] is a 55 year old black male who has history of longstanding hypertension.  He was seen in our office for follow-up visit and evaluation earlier this month.  At that time, he was complaining of a vague anterior chest pain that was seemingly substernal and associated with some degree of shortness of breath.  He was referred on for Adenosine Cardiolite study which has demonstrated an inferior perfusion defect with redistribution and inferior wall motion abnormality.  His ejection fraction was reduced at only 32%.  He is now referred for elective cardiac catheterization.  PAST MEDICAL HISTORY: 1. Longstanding hypertension. 2. Obesity. 3. Ongoing tobacco abuse. 4. Osteoarthritis. 5. Previous knee surgery. 6. History of goiter with subsequent treatment with radioactive iodine in    the 1970s.  ALLERGIES:  No known drug allergies.  MEDICATIONS: 1. Norvasc 10 mg q.d. 2. Zestril 20 mg q.d. 3. Hydrochlorothiazide 25 mg a day. 4. Doxycycline 100 mg b.i.d. 5. Prozac 20 mg a day. 6. Cardura 8 mg a day. 7. Aspirin daily. 8. Synthroid 0.065 mg daily. 9. Nitroglycerin p.r.n.  FAMILY HISTORY:  Both of his parents are deceased.  Father died at 31 of unknown causes, questionable sudden death.  His mother died at the age of 2 and had a history of hypertension and stroke and actually died from cerebral aneurysm.  SOCIAL HISTORY:  He is married.  He has five children.  He has previously been employed in [employer].  He does have history of tobacco use for approximately the  past 20 years.  There is no alcohol use reported.  REVIEW OF SYSTEMS:  Basically as noted above.  He has had no recent fever, flu, or cough-like syndrome.  He noted that he tried to exercise earlier in the course of the week and became quite short of breath as well as had substernal chest discomfort.  It was relieved with rest.  He has had no discomfort at rest.  He continues to struggle with his weight and is quite limited by his osteoarthritis.  Otherwise review of systems is unremarkable.  PHYSICAL EXAMINATION:  VITAL SIGNS:  Weight 297 pounds, blood pressure 130/90 sitting, 128/90 standing, heart rate 120, respirations 18.  He is afebrile.  Skin is warm and dry. Color is unremarkable.  HEENT:  Unremarkable.  LUNGS:  Clear.  HEART:  Regular rate and rhythm.  He is tachycardic.  ABDOMEN:  Obese.  EXTREMITIES:  No evidence of edema.  LABORATORY DATA:  Pending.  IMPRESSION: 1. Chest pain in the setting of an abnormal Adenosine Cardiolite study    revealing inferior perfusion defect. 2. Significant hypertension. 3. Obesity. 4. Ongoing tobacco abuse.  PLAN:  In light of these findings, we will refer him for elective cardiac catheterization, tentatively scheduled for February 25.  He is to call if any problems arise in  the interim. DD:  12/31/00 TD:  12/31/00 Job: [id number]

## 2011-03-29 NOTE — Discharge Summary (Signed)
[place]. [facility]  Patient:    [name] [name], [name] [name] Visit Number: [id number] MRN: [id number]          Service Type: MED Location: [phone] Attending Physician:  Madaline Guthrie Dictated by:   Cheral Bay Admit Date:  08/22/2001 Discharge Date: 08/24/2001   CC:         Duke Salvia, M.D. at [facility] N. Deborah Chalk, M.D.   Discharge Summary  DATE OF BIRTH:  04-Jul-1956.  PATIENT INFORMATION:  Patient address is [address] [place], [place], [address].  Phone number [phone].  ATTENDING PHYSICIAN:  Madaline Guthrie, M.D.  SERVICE:  Internal medicine teaching service.  DISCHARGE DIAGNOSES: 1. Diabetes mellitus. 2. Hypertension. 3. Hyperlipidemia.  DISCHARGE MEDICATIONS: 1. Metformin 500 mg p.o. b.i.d. 2. Glipizide 5 mg p.o. q.d. 3. Doxazosin 4 mg p.o. q.d. 4. Amlodipine 10 mg p.o. q.d. 5. Floxuridine 20 mg p.o. q.d. 6. Furosemide 20 mg p.o. q.d. 7. Protonix 40 mg p.o. q.a.c. 8. Percocet 5/325 mg 1-2 tablets p.o. q.4-6h. p.r.n. pain (10 tablets were    prescribed).  DISPOSITION:  The patients disposition at discharge was to home with follow-up with Dr. Emeline Darling.  CONDITION ON DISCHARGE:  Stable at discharge.  FOLLOW-UP:  Follow-up will be made with Dr. Emeline Darling on September 07, 2001 at 8:15 a.m. at [facility].  The patient will call the triage nurse at [facility] if he is having problems before his appointment.  The patient also directed to contact us if he is having difficulties.  The patient needs re-evaluation of his hyperglycemia, hypertension, and hyperlipidemia.   The patient also had yeast found by urine culture.  However, the patient has been asymptomatic. Wound recommend following up with a UA and necessary laboratories.  There are no pending labs.  LABORATORY DATA:  Chest x-ray done on August 23, 2001 showed that the lungs were clear and mild left ventricular hypertrophic changes with a tortuous ectatic  aortic.  CONSULTANTS:  Diabetic teaching.  HOSPITAL COURSE:  The patient is a 55 year old African-American male who presents with several days of polyuria, polydipsia, changes in vision, and a one-day history of changes in mental status.  The patient was seen in the ED and determined to have a blood glucose level of 991.  At the time of admission, the patient did have an anion gap acidosis.  The patient was given 25 units of insulin in the ED and sent to the floor. Upon arriving to the floor, a second metabolic panel was obtained and the patient was found to have a metabolic acidosis.  The patient was transferred to stepdown unit in order to obtain frequent blood glucoses and metabolic panel.  The patient was started on normal saline at 1000 cc an hour and an insulin drip.  At 1845, day of admission, the patient had an anion gap of 18.  By 12:40 p.m., the day after admission, the patients anion gap was down to 13.  By 3:50 on August 23, 2001, the patients anion gap was at 9.  The next morning, the insulin drip was discontinued and the patient was covered with subcutaneous insulin.  Glucophage was started on August 23, 2001 in the evening.  The next morning, the patients blood glucose levels were in the 300 to 400. The patient was started on Glucotrol.  The patient received diabetic teaching and reported having no symptoms and feeling much better.  The patient was discharged home with a glucometer and instructions to take his  blood glucose at different times in the day for the next two weeks before follow-up.  The patient was instructed to return to the hospital if having symptoms of mental status change or feeling ill.  Although the patient is a 55 year old African-American male, the patient appears to have presented in diabetic ketoacidosis.  However, certain African-American populations with type 2 diabetes have presented diabetic ketoacidosis; therefore, we felt  comfortable in prescribing the patient oral medications.  The patients blood glucose was responding at the time of discharge to metformin.  DISCHARGE DIAGNOSES: 1. Diabetes as mentioned in HPI.  The patients HB/A1C was 15.1.  The patient was    actively reading about diabetes the day of discharge.  The patient is    interested in long-term treatment. 2. Hypertension.  The patients antihypertensive medications were    discontinued during the patients hospital course due to the possibility    of diabetic ketoacidosis and the patient being nothing by mouth.    Antihypertensive medications were reinitiated the day prior to discharge.    The patient continued to have blood pressures in the 140s/100s.  The    patient has a scheduled appointment with his cardiologist in January    and an appointment with his primary care physician at [facility] on    September 07, 2001.  At that time, hypertension could be re-evaluated. 3. Hyperlipidemia.  A lipid panel obtained during hospitalization showed    hyperlipidemia.  The patient requires further outpatient evaluation for    treatment.  Diet and exercise were discussed with the patient. 4. Congestive heart failure.  The patient reports having no symptoms    of shortness of breath during hospitalization.  The patient does have    orthopnea but not paroxysmal nocturnal dyspnea.  The patients chest x-ray    showed a mildly enlarged heart.  The patient is closely followed by    Dr. Colleen Can. Deborah Chalk, his cardiologist.  The patients lungs were clear.    The patient had no peripheral edema and is stable on medications. 5. Jaw abscess.  The patient has recently had a tooth extracted from his    right lower jaw.  The patient continued to have pain in this area;    however, it showed no signs of continued abscess.  The patient was    afebrile during his hospitalization.  The patient will follow up with     his dentist as needed.  The patient was prescribed  10 Percocet for    as needed pain.  DISCHARGE LABORATORY DATA:  For August 22, 2001, at 1845 p.m., the patient had a sodium of 134, potassium 5.0, chloride of 96, CO2 of 20, glucose of 566, BUN 27, and creatinine of 1.5.  August 23, 2001, at 12:40 p.m., sodium 134, potassium 3.4, chloride 95, CO2 26, BUN 22, creatinine 1.5, glucose 431.  August 23, 2001, at 3:50 a.m., sodium 139, potassium 3.9, chloride 103, CO2 27, BUN 19, creatinine 1.4, glucose 302, calcium 8.2.  August 24, 2001, CBC with white blood cells 4.6, hemoglobin 12.8, hematocrit 37.4, MCV 79.9, platelet count 120,000.  Sodium 135, potassium 3.9, chloride 104, CO2 23, glucose 298, BUN 14, creatinine 1.2, calcium 8.3.  Urine cultures showed yeast.  Blood cultures x 2 were negative.  Phosphorus 2.7, magnesium 2.0.  Dictated by:   Cheral Bay Attending Physician:  Madaline Guthrie DD:  08/25/01 TD:  08/25/01 Job: [id number]

## 2011-04-24 ENCOUNTER — Ambulatory Visit (INDEPENDENT_AMBULATORY_CARE_PROVIDER_SITE_OTHER): Payer: Medicare Other | Admitting: Family Medicine

## 2011-04-24 ENCOUNTER — Encounter: Payer: Self-pay | Admitting: Family Medicine

## 2011-04-24 VITALS — BP 140/96 | HR 81 | Ht 72.0 in | Wt 269.8 lb

## 2011-04-24 DIAGNOSIS — F329 Major depressive disorder, single episode, unspecified: Secondary | ICD-10-CM

## 2011-04-24 DIAGNOSIS — I10 Essential (primary) hypertension: Secondary | ICD-10-CM

## 2011-04-24 DIAGNOSIS — E118 Type 2 diabetes mellitus with unspecified complications: Secondary | ICD-10-CM

## 2011-04-24 DIAGNOSIS — F3289 Other specified depressive episodes: Secondary | ICD-10-CM

## 2011-04-24 LAB — COMPREHENSIVE METABOLIC PANEL
ALT: 40 U/L (ref 0–53)
BUN: 7 mg/dL (ref 6–23)
CO2: 25 mEq/L (ref 19–32)
Calcium: 8.8 mg/dL (ref 8.4–10.5)
Chloride: 105 mEq/L (ref 96–112)
Creat: 1.06 mg/dL (ref 0.50–1.35)
Glucose, Bld: 184 mg/dL — ABNORMAL HIGH (ref 70–99)

## 2011-04-24 LAB — POCT GLYCOSYLATED HEMOGLOBIN (HGB A1C): Hemoglobin A1C: 13.4

## 2011-04-25 ENCOUNTER — Encounter: Payer: Self-pay | Admitting: Family Medicine

## 2011-04-25 DIAGNOSIS — I10 Essential (primary) hypertension: Secondary | ICD-10-CM | POA: Insufficient documentation

## 2011-04-25 NOTE — Progress Notes (Signed)
  Subjective:    Patient ID: [name] [name], male    DOB: [birth year], 55 y.o.   MRN: [id number]  HPI  #1. Diabetes mellitus: Has been working out and watching what he continues losing some weight. His blood sugars have been excellent control. He looks forward to the date he can get off some or all of his diabetes medicines. #2: Hypertension. Blood pressure is elevated today because he is very stressed about some financial issues. He is taking off his medicines regularly. He is having no side effects. #3: Left knee pain. That 10 years ago had knee replacement. It is starting to bother him some. His right knee has been bothering him off and on for about 5 years.  Review of Systems   denies fever sweats or chills. He has felt somewhat down but denies suicidal or homicidal ideation. Energy level is down some.  Objective:   Physical Exam GENERALl: Well developed, well nourished, in no acute distress. NECK: Supple, FROM, without lymphadenopathy.  THYROID: normal without nodularity CAROTID ARTERIES: without bruits LUNGS: clear to auscultation bilaterally. No wheezes or rales. HEART: Regular rate and rhythm, no murmurs ABDOMEN: soft with positive bowel sounds NEURO: No gross focal deficits PSYCH: Mood labilewith  some tearfulness today.        Assessment & Plan:  #1 diabetes mellitus: Applied his weight loss and his exercise regimen. We'll check some labs today. No medication changes at this time. #2: Hypertension. He's taking off his medicines but is still having some elevated pressures. He was to discontinue working on his lifestyle modifications and his stressors. We spent greater than 50% of our 40 minute office visit in counseling education regarding stress reduction. Return to clinic 2-3 months.

## 2011-04-26 ENCOUNTER — Encounter: Payer: Self-pay | Admitting: Family Medicine

## 2011-05-02 ENCOUNTER — Encounter: Payer: Self-pay | Admitting: Cardiology

## 2011-06-20 ENCOUNTER — Ambulatory Visit (INDEPENDENT_AMBULATORY_CARE_PROVIDER_SITE_OTHER): Payer: Medicare Other | Admitting: Cardiology

## 2011-06-20 ENCOUNTER — Encounter: Payer: Self-pay | Admitting: Cardiology

## 2011-06-20 ENCOUNTER — Ambulatory Visit: Payer: Medicare Other | Admitting: Cardiology

## 2011-06-20 VITALS — BP 138/92 | HR 80 | Ht 72.0 in | Wt 268.4 lb

## 2011-06-20 DIAGNOSIS — I10 Essential (primary) hypertension: Secondary | ICD-10-CM

## 2011-06-20 DIAGNOSIS — N289 Disorder of kidney and ureter, unspecified: Secondary | ICD-10-CM

## 2011-06-20 MED ORDER — HYDRALAZINE HCL 25 MG PO TABS: 25.0000 mg | ORAL_TABLET | Freq: Three times a day (TID) | ORAL | Status: DC

## 2011-06-20 NOTE — Patient Instructions (Signed)
Your physician wants you to follow-up in: ONE YEAR  You will receive a reminder letter in the mail two months in advance. If you don't receive a letter, please call our office to schedule the follow-up appointment.   Your physician has requested that you have an echocardiogram. Echocardiography is a painless test that uses sound waves to create images of your heart. It provides your doctor with information about the size and shape of your heart and how well your heart's chambers and valves are working. This procedure takes approximately one hour. There are no restrictions for this procedure.   START HYDRALAZINE 25 MG ONE TABLET THREE TIMES DAILY

## 2011-06-20 NOTE — Assessment & Plan Note (Signed)
Management per primary care. 

## 2011-06-20 NOTE — Progress Notes (Signed)
HPI: 55 yo male previously followed by Dr. Deborah Chalk for Fu of hypertension. Cath in Feb 2002 revealed EF 50-55 and normal coronaries. CTA Jan 2006 showed no RAS. Echocardiogram in 2009 revealed borderline normal LV function per old notes. He was last seen in July of 2011. Since then, the patient denies any dyspnea on exertion, orthopnea, PND, pedal edema, palpitations, syncope or chest pain.   Current Outpatient Prescriptions  Medication Sig Dispense Refill  . allopurinol (ZYLOPRIM) 100 MG tablet Take 1 tablet (100 mg total) by mouth daily.  90 tablet  3  . amLODipine (NORVASC) 10 MG tablet Take 1 tablet (10 mg total) by mouth daily.  90 tablet  3  . benazepril (LOTENSIN) 40 MG tablet Take 1 tablet (40 mg total) by mouth daily.  90 tablet  3  . busPIRone (BUSPAR) 10 MG tablet Take 10 mg by mouth daily.        Marland Kitchen doxazosin (CARDURA) 8 MG tablet Take 1 tablet (8 mg total) by mouth daily.  90 tablet  3  . furosemide (LASIX) 80 MG tablet Take 1 tablet (80 mg total) by mouth as directed. 1/2-1 by mouth once daily as needed for lower extremity edema  90 tablet  3  . glipiZIDE (GLUCOTROL XL) 10 MG 24 hr tablet Take 1 tablet (10 mg total) by mouth 2 (two) times daily.  180 tablet  3  . insulin glargine (LANTUS) 100 UNIT/ML injection Inject 50 Units into the skin as directed. Sig 50 units once daily as directed disp 3 months  60 mL  3  . Insulin Syringe-Needle U-100 (B-D INS SYR ULTRAFINE 1CC/30G) 30G X 1/2" 1 ML MISC by Does not apply route. Dispense quantity for once daily injection       . levothyroxine (LEVOTHROID) 100 MCG tablet Take 1 tablet (100 mcg total) by mouth daily. On hold per Dr Deborah Chalk  90 tablet  3  . metFORMIN (GLUCOPHAGE) 1000 MG tablet Take 1 tablet (1,000 mg total) by mouth 2 (two) times daily.  180 tablet  3  . metoprolol (LOPRESSOR) 100 MG tablet Take 1 tablet (100 mg total) by mouth 2 (two) times daily.  180 tablet  3  . simvastatin (ZOCOR) 20 MG tablet Take 1 tablet (20 mg total) by  mouth daily.  90 tablet  3  . colchicine 0.6 MG tablet Take 1 tablet (0.6 mg total) by mouth 2 (two) times daily.  180 tablet  3     Past Medical History  Diagnosis Date  . HTN (hypertension)     labile  . Renal insufficiency, mild   . OA (osteoarthritis) of knee     right; mod-severe  . Sleep apnea     on cpap  . DM (diabetes mellitus), type 2     mild mod sensory loss plantar feet  . Hypothyroidism   . OA (osteoarthritis)   . Depression   . History of colonoscopy     Past Surgical History  Procedure Date  . Total knee arthroplasty 2001    left  . Thyroidectomy, partial     remote  . Cardiolite--no cad ef 34% 01/01/2005    History   Social History  . Marital Status: Married    Spouse Name: N/A    Number of Children: 2  . Years of Education: N/A   Occupational History  . Not on file.   Social History Main Topics  . Smoking status: Current Everyday Smoker -- 1.0 packs/day for 28 years  Types: Cigarettes  . Smokeless tobacco: Never Used  . Alcohol Use: No  . Drug Use: Not on file  . Sexually Active: Not on file   Other Topics Concern  . Not on file   Social History Narrative  . No narrative on file    ROS: Some arthralgias but no fevers or chills, productive cough, hemoptysis, dysphasia, odynophagia, melena, hematochezia, dysuria, hematuria, rash, seizure activity, orthopnea, PND, pedal edema, claudication. Remaining systems are negative.  Physical Exam: Well-developed well-nourished in no acute distress.  Skin is warm and dry.  HEENT is normal.  Neck is supple. No thyromegaly.  Chest is clear to auscultation with normal expansion.  Cardiovascular exam is regular rate and rhythm.  Abdominal exam nontender or distended. No masses palpated. Extremities show no edema. neuro grossly intact  ECG sinus rhythm with occasional PVCs. Left posterior fascicular block.

## 2011-06-20 NOTE — Assessment & Plan Note (Signed)
Renal function is monitored by primary care.

## 2011-06-20 NOTE — Assessment & Plan Note (Signed)
Counseled on discontinuing. 

## 2011-06-20 NOTE — Assessment & Plan Note (Addendum)
Patient's blood pressure is mildly elevated. Add hydralazine 25 mg p.o. T.i.d. Repeat echocardiogram for LV function and wall thickness.

## 2011-06-28 ENCOUNTER — Ambulatory Visit (HOSPITAL_COMMUNITY): Payer: Medicare Other | Admitting: Radiology

## 2011-07-03 ENCOUNTER — Ambulatory Visit (HOSPITAL_COMMUNITY): Payer: Medicare Other | Attending: Cardiology | Admitting: Radiology

## 2011-07-03 DIAGNOSIS — I10 Essential (primary) hypertension: Secondary | ICD-10-CM | POA: Insufficient documentation

## 2011-07-03 DIAGNOSIS — I369 Nonrheumatic tricuspid valve disorder, unspecified: Secondary | ICD-10-CM

## 2011-07-03 DIAGNOSIS — I517 Cardiomegaly: Secondary | ICD-10-CM

## 2011-07-11 ENCOUNTER — Ambulatory Visit (INDEPENDENT_AMBULATORY_CARE_PROVIDER_SITE_OTHER): Payer: Medicare Other | Admitting: Family Medicine

## 2011-07-11 VITALS — BP 158/104 | HR 86 | Temp 97.0°F | Ht 72.0 in | Wt 266.0 lb

## 2011-07-11 DIAGNOSIS — N289 Disorder of kidney and ureter, unspecified: Secondary | ICD-10-CM

## 2011-07-11 DIAGNOSIS — E118 Type 2 diabetes mellitus with unspecified complications: Secondary | ICD-10-CM

## 2011-07-11 DIAGNOSIS — M549 Dorsalgia, unspecified: Secondary | ICD-10-CM

## 2011-07-11 DIAGNOSIS — M62838 Other muscle spasm: Secondary | ICD-10-CM

## 2011-07-11 DIAGNOSIS — M5380 Other specified dorsopathies, site unspecified: Secondary | ICD-10-CM

## 2011-07-11 DIAGNOSIS — M62830 Muscle spasm of back: Secondary | ICD-10-CM

## 2011-07-11 LAB — POCT UA - MICROSCOPIC ONLY

## 2011-07-11 LAB — POCT URINALYSIS DIPSTICK
Bilirubin, UA: NEGATIVE
Glucose, UA: 100
Nitrite, UA: NEGATIVE
Spec Grav, UA: 1.02
pH, UA: 6

## 2011-07-11 MED ORDER — KETOROLAC TROMETHAMINE 60 MG/2ML IM SOLN
60.0000 mg | Freq: Once | INTRAMUSCULAR | Status: AC
  Administered 2011-07-11: 60 mg via INTRAMUSCULAR

## 2011-07-11 MED ORDER — CEFTRIAXONE SODIUM 250 MG IJ SOLR: 250.0000 mg | INTRAMUSCULAR | Status: DC

## 2011-07-11 MED ORDER — CEFTRIAXONE SODIUM 250 MG IJ SOLR
250.0000 mg | Freq: Once | INTRAMUSCULAR | Status: AC
  Administered 2011-07-11: 250 mg via INTRAMUSCULAR

## 2011-07-12 ENCOUNTER — Ambulatory Visit (INDEPENDENT_AMBULATORY_CARE_PROVIDER_SITE_OTHER): Payer: Medicare Other | Admitting: Family Medicine

## 2011-07-12 VITALS — BP 154/94

## 2011-07-12 DIAGNOSIS — M62838 Other muscle spasm: Secondary | ICD-10-CM

## 2011-07-12 DIAGNOSIS — M62830 Muscle spasm of back: Secondary | ICD-10-CM

## 2011-07-13 LAB — CULTURE, URINE COMPREHENSIVE: Organism ID, Bacteria: NO GROWTH

## 2011-07-15 NOTE — Progress Notes (Signed)
  Subjective:    Patient ID: [name] [name], male    DOB: 20-Jun-1956, 55 y.o.   MRN: [id number]  HPI  Right low back pain for 6 days. He's worried he has a kidney infection. The pain has been gradually getting worse until today it is about a 9/10.Marland Kitchen it seems to radiate into his right buttock but not into his leg. He is urine seems to have a foul odor. He's had some increasing frequency. He said no burning on urination, no penile discharge.  Review of Systems Denies fever sweats or chills.    Objective:   Physical Exam   GENERAL: Well-developed male in moderate distress. BACK: No CVA tenderness to percussion. He is tender over the right iliac crest at the junction of the muscle insertion. Distal muscle is in extreme spasm and this reproduces his pain as does flexion at the hips. Straight leg raise is negative bilaterally.  INJECTION: Patient was given informed consent, signed copy in the chart. Appropriate time out was taken. Area prepped and draped in usual sterile fashion. One cc of kenalog plus  4 cc of lidocaine was injected into the area above the right PSIS and into his repeat trigger point areas of that muscle using a(n)  approach. The patient tolerated the procedure well. There were no complications. Post procedure instructions were given.      Assessment & Plan:  Back pain. This is musculoskeletal in origin. He was doing some gardening work which required leaning forward. He has not been doing any outside work for some time. I suspect he got this muscle into spasm doing that. He states he's not having money for a prescription for muscle relaxers. Recommended heat and ice and to followup if he's not improving in the next 24-48 hours. I will also culture his urine. Urinalysis was negative.

## 2011-07-15 NOTE — Progress Notes (Signed)
  Subjective:    Patient ID: [name] [name], male    DOB: 1956-05-15, 55 y.o.   MRN: [id number]  HPI  Followup right lower back pain. It's better but still keeping him up at night because of pain. The shot seemed to help a lot but it seemed to wear off. He wants to know what else he can do.  Review of Systems No urinary or bowel incontinence. No numbness or tingling or weakness in his lower extremities.    Objective:   Physical Exam  GENERAL: Well-developed overweight male some mild to moderate distress. BACK: Improved but still tense right lumbar musculature. Lower extremity strength is 5 out of 5. INJECTION: Patient was given informed consent, signed copy in the chart. Appropriate time out was taken. Area prepped and draped in usual sterile fashion. One cc of kenalog plus  3 cc of lidocaine was injected into the posterior superior iliac muscle insertion area using a(n) posterior approach. The patient tolerated the procedure well. There were no complications. Post procedure instructions were given.        Assessment & Plan:  Continue muscle spasm. I did give him an additional muscle trigger point injection today. I also showed him the hyperextension back exercises and we went over this in detail. I will have him start on those tonight. I have given this to him and handout form. We also discussed in general weight loss andcore conditioning.

## 2011-08-22 ENCOUNTER — Ambulatory Visit (INDEPENDENT_AMBULATORY_CARE_PROVIDER_SITE_OTHER): Payer: Medicare Other | Admitting: Family Medicine

## 2011-08-22 ENCOUNTER — Encounter: Payer: Self-pay | Admitting: Family Medicine

## 2011-08-22 VITALS — BP 183/96 | HR 78 | Temp 97.4°F | Ht 72.0 in | Wt 246.6 lb

## 2011-08-22 DIAGNOSIS — M545 Low back pain, unspecified: Secondary | ICD-10-CM

## 2011-08-22 DIAGNOSIS — E118 Type 2 diabetes mellitus with unspecified complications: Secondary | ICD-10-CM

## 2011-08-22 MED ORDER — AMITRIPTYLINE HCL 25 MG PO TABS: ORAL_TABLET | ORAL | Status: DC

## 2011-08-22 MED ORDER — HYDROCODONE-ACETAMINOPHEN 5-325 MG PO TABS: 2.0000 | ORAL_TABLET | Freq: Four times a day (QID) | ORAL | Status: AC | PRN

## 2011-08-22 NOTE — Patient Instructions (Signed)
I am setting you up for an MRI of the back as I worry that you have a pinched nerve in your back causing the back and leg pain. I have given you some more hydrocodone. I have also called in some amitriptylene--start one at night for 3 night, increase to two at night three nights and then three at night.

## 2011-08-23 NOTE — Progress Notes (Signed)
  Subjective:    Patient ID: [name] [name], male    DOB: 1956-04-13, 55 y.o.   MRN: [id number]  HPI Right hip, buttock and right leg pain. Since I gave him the injections in his back the lower area of his back has stopped hurting. Now the pain seems centered in the right buttock and radiates down to below his knee on the right side in a shooting fashion. It is there all the time but worse with standing, walking or trying to bend over. He says he's virtually unable to bend over at the waist because it hurts too bad. He also has difficulty sitting for any length of time. He denies any urinary or fecal incontinence. The pain is 6-8/10 at its worst and at its best 4/10. He has been using hydrocodone with minimal benefit since he really couldn't get along without it. He has a job interview next week. A new job would require a lot of standing on his feet to be quite worried. He really needs to job.  No history of back surgery. No specific back injury. He has had back pain now for several weeks but this is the first time it's really started radiating down into his right leg.   Review of Systems Please see history of present illness. Additionally denies unusual weight change, denies fever sweats or chills. Denies numbness of the foot.    Objective:   Physical Exam  GENERAL: Obese male no acute distress. BACK: Vertebra are nontender to percussion. Back is without deformity. There is no area of muscle spasm noted in the lumbar areas. There is no tenderness at the PS IS on either side. He is mildly tender to palpation over the sciatic notch and palpation here increases his leg pain. Straight leg raise very positive on the right both in seated and supine positions negative on the left. Lower extremity strength 5 out of 5 in hip flexors and extensors, knee flexors extensors. Normal dorsiflexion and plantar flexion. NEURO: DTRs 2+ bilaterally equal knee;  1+ bilaterally equal ankle.      Assessment & Plan:    Low back pain with fairly new symptom of significant radiculopathy on the right. I think we need an MRI and we'll set that up. I did give him some more hydrocodone. I will discuss results with him after MRI. I also started him on amitriptyline. We did discuss starting gabapentin for his bili was still something cheaper so we will try the amitriptyline.

## 2011-08-24 ENCOUNTER — Ambulatory Visit
Admission: RE | Admit: 2011-08-24 | Discharge: 2011-08-24 | Disposition: A | Discharge status: Home / Self Care | Payer: Medicare Other | Source: Ambulatory Visit | Attending: Family Medicine | Admitting: Family Medicine

## 2011-08-24 DIAGNOSIS — M545 Low back pain: Secondary | ICD-10-CM

## 2011-08-27 ENCOUNTER — Telehealth: Payer: Self-pay | Admitting: Family Medicine

## 2011-08-27 DIAGNOSIS — M5410 Radiculopathy, site unspecified: Secondary | ICD-10-CM

## 2011-08-27 MED ORDER — PREDNISONE 10 MG PO TABS: ORAL_TABLET | ORAL | Status: DC

## 2011-08-27 NOTE — Telephone Encounter (Signed)
Pt had MRI a few days ago and wants to know the results

## 2011-08-27 NOTE — Telephone Encounter (Signed)
Informed pt of his results and told him that Dr. Jennette Kettle has called in an Rx to [facility] for him to take and cautioned him of this medication causing his BS to increase. Advised of increase of insulin should this occur. Pt given the ranges and understood and agreed to this.Loralee Pacas [place]

## 2011-08-27 NOTE — Telephone Encounter (Signed)
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 Spoke with Dr. Jennette Kettle and she stated that it would be fine that his back will hurt more but no indication for him not to accept the job.  Pt informed and understood.Loralee Pacas [place]

## 2011-08-27 NOTE — Telephone Encounter (Signed)
Dear Cliffton Asters Team Please call him and tell him that I was right--he has a big old bulging disc in his back. i want to put him on  A steroid dose pack--I will call  It in--it WILL cause his sugar to go up for 10 days so he will have to use a few extra units ofinsulin a day for that ten days--add an additional 5-10 u of lantui a da--add 5 if CBG >350 and add 10 if CBG> 500.  I will also set him up w a surgeon for evaluation as he mayu need surgery. THANKS! Denny Levy

## 2011-09-02 ENCOUNTER — Telehealth: Payer: Self-pay | Admitting: Family Medicine

## 2011-09-02 NOTE — Telephone Encounter (Signed)
Patient needs MRI sent to [facility]/ Dr. Jeral Fruit.  His appointment is on October 29th.

## 2011-09-02 NOTE — Telephone Encounter (Signed)
Mri results faxed to [phone] attn Dr. Jeral Fruit.Laureen Ochs, Viann Shove

## 2011-09-09 ENCOUNTER — Telehealth: Payer: Self-pay | Admitting: Cardiology

## 2011-09-09 NOTE — Telephone Encounter (Signed)
Walk in pt Form" Pt Needs Surgical Clearance on Lower Back" sent to [facility]

## 2011-09-10 ENCOUNTER — Telehealth: Payer: Self-pay | Admitting: Cardiology

## 2011-09-10 NOTE — Telephone Encounter (Signed)
Left message for Shanda Bumps, per dr Jens Som pt will need stress test prior to clearance Deliah Goody

## 2011-09-10 NOTE — Telephone Encounter (Signed)
New message:  Surgical clearance letter faxed on 10-29. Has this been completed?  Please call her and fax this form back to [phone].  Surg is tomorrow.  Back surgery.

## 2011-09-11 ENCOUNTER — Inpatient Hospital Stay (HOSPITAL_COMMUNITY)
Admission: AD | Admit: 2011-09-11 | Discharge: 2011-09-12 | DRG: 305 | Disposition: A | Discharge status: Home / Self Care | Payer: Medicare Other | Source: Ambulatory Visit | Attending: Cardiology | Admitting: Cardiology

## 2011-09-11 ENCOUNTER — Ambulatory Visit (INDEPENDENT_AMBULATORY_CARE_PROVIDER_SITE_OTHER): Payer: Medicare Other | Admitting: Cardiology

## 2011-09-11 ENCOUNTER — Inpatient Hospital Stay (HOSPITAL_COMMUNITY): Payer: Medicare Other

## 2011-09-11 ENCOUNTER — Encounter: Payer: Self-pay | Admitting: Cardiology

## 2011-09-11 ENCOUNTER — Ambulatory Visit (HOSPITAL_COMMUNITY): Admission: RE | Admit: 2011-09-11 | Payer: Medicare Other | Source: Ambulatory Visit | Admitting: Neurosurgery

## 2011-09-11 VITALS — BP 210/141 | HR 90 | Ht 72.0 in | Wt 273.0 lb

## 2011-09-11 DIAGNOSIS — Z9119 Patient's noncompliance with other medical treatment and regimen: Secondary | ICD-10-CM

## 2011-09-11 DIAGNOSIS — Z91199 Patient's noncompliance with other medical treatment and regimen due to unspecified reason: Secondary | ICD-10-CM

## 2011-09-11 DIAGNOSIS — M1710 Unilateral primary osteoarthritis, unspecified knee: Secondary | ICD-10-CM | POA: Diagnosis present

## 2011-09-11 DIAGNOSIS — I517 Cardiomegaly: Secondary | ICD-10-CM | POA: Diagnosis present

## 2011-09-11 DIAGNOSIS — I10 Essential (primary) hypertension: Principal | ICD-10-CM | POA: Diagnosis present

## 2011-09-11 DIAGNOSIS — F329 Major depressive disorder, single episode, unspecified: Secondary | ICD-10-CM | POA: Diagnosis present

## 2011-09-11 DIAGNOSIS — G4730 Sleep apnea, unspecified: Secondary | ICD-10-CM | POA: Diagnosis present

## 2011-09-11 DIAGNOSIS — E039 Hypothyroidism, unspecified: Secondary | ICD-10-CM | POA: Diagnosis present

## 2011-09-11 DIAGNOSIS — F17200 Nicotine dependence, unspecified, uncomplicated: Secondary | ICD-10-CM | POA: Diagnosis present

## 2011-09-11 DIAGNOSIS — Z96659 Presence of unspecified artificial knee joint: Secondary | ICD-10-CM

## 2011-09-11 DIAGNOSIS — IMO0002 Reserved for concepts with insufficient information to code with codable children: Secondary | ICD-10-CM | POA: Diagnosis present

## 2011-09-11 DIAGNOSIS — Z79899 Other long term (current) drug therapy: Secondary | ICD-10-CM

## 2011-09-11 DIAGNOSIS — N289 Disorder of kidney and ureter, unspecified: Secondary | ICD-10-CM | POA: Diagnosis present

## 2011-09-11 DIAGNOSIS — Z794 Long term (current) use of insulin: Secondary | ICD-10-CM

## 2011-09-11 DIAGNOSIS — E118 Type 2 diabetes mellitus with unspecified complications: Secondary | ICD-10-CM

## 2011-09-11 DIAGNOSIS — E669 Obesity, unspecified: Secondary | ICD-10-CM | POA: Diagnosis present

## 2011-09-11 DIAGNOSIS — Z01810 Encounter for preprocedural cardiovascular examination: Secondary | ICD-10-CM

## 2011-09-11 DIAGNOSIS — E119 Type 2 diabetes mellitus without complications: Secondary | ICD-10-CM | POA: Diagnosis present

## 2011-09-11 DIAGNOSIS — E785 Hyperlipidemia, unspecified: Secondary | ICD-10-CM

## 2011-09-11 DIAGNOSIS — I160 Hypertensive urgency: Secondary | ICD-10-CM

## 2011-09-11 DIAGNOSIS — F3289 Other specified depressive episodes: Secondary | ICD-10-CM | POA: Diagnosis present

## 2011-09-11 LAB — COMPREHENSIVE METABOLIC PANEL
Alkaline Phosphatase: 67 U/L (ref 39–117)
BUN: 12 mg/dL (ref 6–23)
CO2: 25 mEq/L (ref 19–32)
Chloride: 102 mEq/L (ref 96–112)
GFR calc Af Amer: >90 mL/min (ref >90)
GFR calc non Af Amer: >90 mL/min (ref >90)
Glucose, Bld: 72 mg/dL (ref 70–99)
Potassium: 3.7 mEq/L (ref 3.5–5.1)
Total Bilirubin: 0.6 mg/dL (ref 0.3–1.2)

## 2011-09-11 LAB — CBC
HCT: 55.1 % — ABNORMAL HIGH (ref 39.0–52.0)
Hemoglobin: 19 g/dL — ABNORMAL HIGH (ref 13.0–17.0)
MCHC: 34.5 g/dL (ref 30.0–36.0)
RBC: 6.66 MIL/uL — ABNORMAL HIGH (ref 4.22–5.81)
WBC: 8.8 10*3/uL (ref 4.0–10.5)

## 2011-09-11 LAB — URINALYSIS, ROUTINE W REFLEX MICROSCOPIC
Bilirubin Urine: NEGATIVE
Protein, ur: >300 mg/dL — AB
Urobilinogen, UA: 1 mg/dL (ref 0.0–1.0)

## 2011-09-11 LAB — MRSA PCR SCREENING: MRSA by PCR: NEGATIVE

## 2011-09-11 LAB — URINE MICROSCOPIC-ADD ON

## 2011-09-11 MED ORDER — CLONIDINE HCL 0.1 MG PO TABS
0.1000 mg | ORAL_TABLET | Freq: Once | ORAL | Status: AC
  Administered 2011-09-11: 0.1 mg via ORAL

## 2011-09-11 NOTE — Assessment & Plan Note (Signed)
Continue statin. 

## 2011-09-11 NOTE — Assessment & Plan Note (Signed)
Continue present medications. 

## 2011-09-11 NOTE — Progress Notes (Signed)
HPI:55 yo male previously followed by Dr. Deborah Chalk for Fu of hypertension and preoperative evaluation prior to back surgery. Cath in Feb 2002 revealed EF 50-55 and normal coronaries. CTA Jan 2006 showed no RAS. Echocardiogram in 2009 revealed borderline normal LV function per old notes. He was last seen in August of 2012. Since then, the patient denies any dyspnea on exertion, orthopnea, PND, pedal edema, palpitations, syncope or chest pain. Note the patient has not taken his amlodipine, Lotensin, Cardura, one Lasix in 2 weeks. In August the patient injured his back which has progressively worsened. He apparently has a disc problem that will require surgery. We were asked to evaluate preoperatively. Patient denies headache.   Current Outpatient Prescriptions  Medication Sig Dispense Refill  . allopurinol (ZYLOPRIM) 100 MG tablet Take 1 tablet (100 mg total) by mouth daily.  90 tablet  3  . amitriptyline (ELAVIL) 25 MG tablet Taper up to three at night (patient has written taper)  90 tablet  1  . amLODipine (NORVASC) 10 MG tablet Take 1 tablet (10 mg total) by mouth daily.  90 tablet  3  . benazepril (LOTENSIN) 40 MG tablet Take 1 tablet (40 mg total) by mouth daily.  90 tablet  3  . busPIRone (BUSPAR) 10 MG tablet Take 10 mg by mouth daily.        . colchicine 0.6 MG tablet Take 1 tablet (0.6 mg total) by mouth 2 (two) times daily.  180 tablet  3  . doxazosin (CARDURA) 8 MG tablet Take 1 tablet (8 mg total) by mouth daily.  90 tablet  3  . furosemide (LASIX) 80 MG tablet Take 1 tablet (80 mg total) by mouth as directed. 1/2-1 by mouth once daily as needed for lower extremity edema  90 tablet  3  . glipiZIDE (GLUCOTROL XL) 10 MG 24 hr tablet Take 1 tablet (10 mg total) by mouth 2 (two) times daily.  180 tablet  3  . hydrALAZINE (APRESOLINE) 25 MG tablet Take 1 tablet (25 mg total) by mouth 3 (three) times daily.  270 tablet  11  . HYDROcodone-acetaminophen (NORCO) 5-325 MG per tablet       . insulin  glargine (LANTUS) 100 UNIT/ML injection Inject 20 Units into the skin as directed. Sig 50 units once daily as directed disp 3 months       . Insulin Syringe-Needle U-100 (B-D INS SYR ULTRAFINE 1CC/30G) 30G X 1/2" 1 ML MISC by Does not apply route. Dispense quantity for once daily injection       . metFORMIN (GLUCOPHAGE) 1000 MG tablet Take 1 tablet (1,000 mg total) by mouth 2 (two) times daily.  180 tablet  3  . metoprolol (LOPRESSOR) 100 MG tablet Take 1 tablet (100 mg total) by mouth 2 (two) times daily.  180 tablet  3  . predniSONE (DELTASONE) 10 MG tablet Take by mouth 6 tabs a day for 3 days, 4 tabs a day for next three days and 2 tabs a say for  Three days.  42 tablet  1  . simvastatin (ZOCOR) 20 MG tablet Take 1 tablet (20 mg total) by mouth daily.  90 tablet  3  . DISCONTD: insulin glargine (LANTUS) 100 UNIT/ML injection Inject 50 Units into the skin as directed. Sig 50 units once daily as directed disp 3 months  60 mL  3  . levothyroxine (LEVOTHROID) 100 MCG tablet Take 1 tablet (100 mcg total) by mouth daily. On hold per Dr Deborah Chalk  90 tablet  3   Current Facility-Administered Medications  Medication Dose Route Frequency Provider Last Rate Last Dose  . cloNIDine (CATAPRES) tablet 0.1 mg  0.1 mg Oral Once Lewayne Bunting, MD   0.1 mg at 09/11/11 1709     Past Medical History  Diagnosis Date  . HTN (hypertension)     labile  . Renal insufficiency, mild   . OA (osteoarthritis) of knee     right; mod-severe  . Sleep apnea     on cpap  . DM (diabetes mellitus), type 2     mild mod sensory loss plantar feet  . Hypothyroidism   . OA (osteoarthritis)   . Depression   . History of colonoscopy     Past Surgical History  Procedure Date  . Total knee arthroplasty 2001    left  . Thyroidectomy, partial     remote  . Cardiolite--no cad ef 34% 01/01/2005    History   Social History  . Marital Status: Married    Spouse Name: N/A    Number of Children: 2  . Years of Education:  N/A   Occupational History  . Not on file.   Social History Main Topics  . Smoking status: Current Everyday Smoker -- 1.0 packs/day for 28 years    Types: Cigarettes  . Smokeless tobacco: Never Used  . Alcohol Use: No  . Drug Use: Not on file  . Sexually Active: Not on file   Other Topics Concern  . Not on file   Social History Narrative  . No narrative on file    ROS: significant back and right lower extremity pain but no fevers or chills, productive cough, hemoptysis, dysphasia, odynophagia, melena, hematochezia, dysuria, hematuria, rash, seizure activity, orthopnea, PND, pedal edema, claudication. Remaining systems are negative.  Physical Exam: Well-developed well-nourished in no acute distress.  Skin is warm and dry.  Patient does not appear to be depressed. No peripheral clubbing. Back is normal. HEENT is normal. Normal eyelids. No papilledema Neck is supple. No thyromegaly. No thyromegaly or bruits. Chest is clear to auscultation with normal expansion.  Cardiovascular exam is regular rate and rhythm. No murmurs rubs or gallops. Abdominal exam nontender or distended. No masses palpated. No hepatosplenomegaly noted. Extremities show no edema. neuro grossly intact  ECG NSR, limb lead reversal, pacs, nonspecific ST changes.

## 2011-09-11 NOTE — Assessment & Plan Note (Signed)
Patient counseled on discontinuing. 

## 2011-09-11 NOTE — Assessment & Plan Note (Addendum)
Patient has severely elevated blood pressure in the office today. His initial pressure was 200/140. He was given clonidine 0.1 mg p.o., felodipine 10 mg p.o., and lisinopril 40 mg p.o. His diastolic blood pressure remained 135. Note he has not taken any of his blood pressure medications in the past 2 weeks because of financial difficulties. We will admit to step down. Resume all medications. Increase medications as needed. Note we will not give any more medications this evening unless his blood pressure is severely elevated. We would like to avoid precipitous drop. I would accept a systolic of 180 and a diastolic of 100-110 this evening.

## 2011-09-11 NOTE — Assessment & Plan Note (Addendum)
Patient with multiple risk factors but denies chest pain or shortness of breath. He states he had a stress test approximately one and one half years ago. We will obtain those results. If normal he may proceed with back surgery with no further ischemia evaluation.   Addendum; review of records shows last myoview 06/07/08 and revealed normal perfusion and EF 37 felt related to hypertensive heart disease. Echo 6/09 showed normal LV function and moderate LVH. Will repeat echo in hospital; if EF normal, ok for surgery.

## 2011-09-12 DIAGNOSIS — I10 Essential (primary) hypertension: Secondary | ICD-10-CM

## 2011-09-12 DIAGNOSIS — I517 Cardiomegaly: Secondary | ICD-10-CM

## 2011-09-12 LAB — TSH: TSH: 9.749 u[IU]/mL — ABNORMAL HIGH (ref 0.350–4.500)

## 2011-09-12 LAB — GLUCOSE, CAPILLARY: Glucose-Capillary: 79 mg/dL (ref 70–99)

## 2011-09-14 NOTE — Discharge Summary (Signed)
NAMEBENTLEY, FISSEL NO.:  [id number]  MEDICAL RECORD NO.:  [id number]  LOCATION:  2924                         FACILITY:  [facility]  PHYSICIAN:  Madolyn Frieze. Jens Som, MD, FACCDATE OF BIRTH:  Aug 18, 1956  DATE OF ADMISSION:  09/11/2011 DATE OF DISCHARGE:  09/12/2011                              DISCHARGE SUMMARY   DISCHARGE DIAGNOSES: 1. Uncontrolled hypertension.     a.     A 2D echocardiogram demonstrating moderate-to-severe left      ventricular hypertrophy with ejection fraction of 50-55% with      grade 1 diastolic dysfunction.     b.     CT-angio in January 2006 demonstrated no renal artery      stenosis. 2. Normal coronary arteries by cath in 2002. 3. Diabetes mellitus. 4. Obesity.  HOSPITAL COURSE:  Mr. [name] [name] is a 55 year old gentleman with a history of hypertension, who presented to the office for routine preop clearance for a possible back surgery.  He has had issues with back pain and has a disk potentially requiring surgery.  He denied any headaches, dyspnea on exertion, or chest pain.  In the office, his blood pressure was felt to be severely elevated at 200/140.  The patient admitted to missing his blood pressure medications for 2 weeks secondary to financial complaints.  He was admitted to the hospital for uncontrolled hypertension and I placed back on his medication regimen consistent with his prior home medicines.  He was encouraged to stop smoking.  He was seen by case manager to help him to go over which resources would be available in the community including the $4 list at [employer], which was quite helpful.  2D echocardiogram was obtained demonstrating the above findings.  Dr. Jens Som was seen and examined him today and feels he is stable for discharge and feels that he is okay to proceed with surgery.  Dr. Jens Som noted he had an Myoview in 2009, showing no ischemia.  Blood pressure today is down to 140.  DISCHARGE LABS:  WBC 8.8,  hemoglobin 19, hematocrit 55.1, platelet count 131.  Sodium 138, potassium 3.7, chloride 102, CO2 of 25, glucose 72, BUN 12, creatinine 0.96.  LFTs were normal with exception of albumin 2.6.  TSH 9.749.  UA showed trace blood and greater than 300 protein.  STUDIES:  Chest x-ray on October 31, showed stable mild cardiac enlargement and moderate central vascular congestion.  DISCHARGE MEDICATIONS: 1. Simvastatin 20 mg at bedtime. 2. Allopurinol 100 mg daily. 3. Amitriptyline 25 mg 2 tablets daily at bedtime. 4. Benazepril 40 mg daily. 5. BuSpar 10 mg daily. 6. Colchicine 0.6 mg b.i.d. 7. Doxazosin 8 mg daily. 8. Glipizide 10 mg b.i.d. 9. Hydralazine 25 mg b.i.d. 10.Hydrocodone/CPAP 5/325 mg q.6 h. p.r.n. pain. 11.Lantus 50 units subcutaneously at bedtime. 12.Lasix 80 mg half tablet to one tablet daily as needed for lower     extremity edema. 13.Levothyroxine 50 mcg daily. 14.Metformin 1000 mg b.i.d. 15.Metoprolol tartrate 100 mg b.i.d. 16.Norvasc 10 mg daily.  Prescriptions were written for simvastatin, benazepril, doxazosin, hydralazine, Lasix, metoprolol tartrate, and amlodipine.  Patient was strongly encouraged to follow with his primary care doctor for his other  medicines as needed.  DISPOSITION:  Mr. Renbarger will be discharged in stable condition to home. He is to increase activity slowly and may walk up steps and may shower and bathe.  He is to follow a low-sodium, heart-healthy diet, and per Dr. Jens Som may have back surgery.  He is encouraged to quit smoking. He will have a BMET in 1 week at our office and follow up with Dr. Jens Som in 4 weeks.  He is also to follow up with his primary care within one week and have recheck of thyroid function tests as his TSH was elevated at this admission.  He is also to follow up with his PCP regarding his diabetes medicine regimen given case management recommendation to possibly adjust the dosing of glipizide, along with other  chronic conditions.  DURATION OF DISCHARGE ENCOUNTER:  Greater than 30 minutes including physician and PA time.     Dayna Dunn, P.A.C.   ______________________________ Madolyn Frieze. Jens Som, MD, [facility]    DD/MEDQ  D:  09/12/2011  T:  09/13/2011  Job:  [id number]  cc:   Madolyn Frieze CRENSHAW Nestor Ramp, MD

## 2011-09-16 ENCOUNTER — Ambulatory Visit (INDEPENDENT_AMBULATORY_CARE_PROVIDER_SITE_OTHER): Payer: Medicare Other | Admitting: *Deleted

## 2011-09-16 DIAGNOSIS — E785 Hyperlipidemia, unspecified: Secondary | ICD-10-CM

## 2011-09-16 DIAGNOSIS — I5022 Chronic systolic (congestive) heart failure: Secondary | ICD-10-CM

## 2011-09-16 DIAGNOSIS — I2510 Atherosclerotic heart disease of native coronary artery without angina pectoris: Secondary | ICD-10-CM

## 2011-09-16 LAB — BASIC METABOLIC PANEL WITH GFR
BUN: 18 mg/dL (ref 6–23)
CO2: 25 meq/L (ref 19–32)
Calcium: 8.3 mg/dL — ABNORMAL LOW (ref 8.4–10.5)
Chloride: 103 meq/L (ref 96–112)
Creatinine, Ser: 1.2 mg/dL (ref 0.4–1.5)
GFR: 80.93 mL/min
Glucose, Bld: 201 mg/dL — ABNORMAL HIGH (ref 70–99)
Potassium: 3.4 meq/L — ABNORMAL LOW (ref 3.5–5.1)
Sodium: 140 meq/L (ref 135–145)

## 2011-09-17 ENCOUNTER — Telehealth: Payer: Self-pay | Admitting: Cardiology

## 2011-09-17 MED ORDER — POTASSIUM CHLORIDE 10 MEQ PO TBCR: 10.0000 meq | EXTENDED_RELEASE_TABLET | Freq: Every day | ORAL | Status: DC

## 2011-09-17 NOTE — Telephone Encounter (Signed)
Pt returning call to our office. Please call back.  

## 2011-09-17 NOTE — Telephone Encounter (Signed)
Spoke with pt, aware of labs results. He has an appt for pre-op on Friday. Will have labs drawn at that time. [name] [name]

## 2011-09-18 ENCOUNTER — Other Ambulatory Visit: Payer: Self-pay | Admitting: Neurosurgery

## 2011-09-19 ENCOUNTER — Encounter (HOSPITAL_COMMUNITY): Payer: Self-pay | Admitting: Pharmacy Technician

## 2011-09-20 ENCOUNTER — Encounter (HOSPITAL_COMMUNITY): Payer: Self-pay

## 2011-09-20 ENCOUNTER — Telehealth: Payer: Self-pay | Admitting: Family Medicine

## 2011-09-20 ENCOUNTER — Encounter (HOSPITAL_COMMUNITY)
Admission: RE | Admit: 2011-09-20 | Discharge: 2011-09-20 | Disposition: A | Discharge status: Home / Self Care | Payer: Medicare Other | Source: Ambulatory Visit | Attending: Neurosurgery | Admitting: Neurosurgery

## 2011-09-20 HISTORY — DX: Other specified diabetes mellitus with diabetic autonomic (poly)neuropathy: E13.43

## 2011-09-20 HISTORY — DX: Anxiety disorder, unspecified: F41.9

## 2011-09-20 LAB — BASIC METABOLIC PANEL
Chloride: 103 mEq/L (ref 96–112)
GFR calc Af Amer: 84 mL/min — ABNORMAL LOW (ref >90)
GFR calc non Af Amer: 73 mL/min — ABNORMAL LOW (ref >90)
Potassium: 3.3 mEq/L — ABNORMAL LOW (ref 3.5–5.1)
Sodium: 142 mEq/L (ref 135–145)

## 2011-09-20 LAB — CBC
HCT: 51.9 % (ref 39.0–52.0)
Hemoglobin: 17.8 g/dL — ABNORMAL HIGH (ref 13.0–17.0)
RBC: 6.18 MIL/uL — ABNORMAL HIGH (ref 4.22–5.81)
WBC: 5.5 10*3/uL (ref 4.0–10.5)

## 2011-09-20 NOTE — Pre-Procedure Instructions (Signed)
20 [name] [name]  09/20/2011   Your procedure is scheduled on:  Wednesday,Nov 14  Report to [facility] at 1000 AM.  Call this number if you have problems the morning of surgery: [phone]   Remember:   Do not eat food:After Midnight.  Do not drink clear liquids: 4 Hours before arrival.  Take these medicines the morning of surgery with A SIP OF WATER: Amlodipine,Doxazosin,Hydralazine,Metoprolol,Hydrcodone,    Do not wear jewelry, make-up or nail polish.  Do not wear lotions, powders, or perfumes. You may wear deodorant.  Do not shave 48 hours prior to surgery.  Do not bring valuables to the hospital.  Contacts, dentures or bridgework may not be worn into surgery.  Leave suitcase in the car. After surgery it may be brought to your room.  For patients admitted to the hospital, checkout time is 11:00 AM the day of discharge.   Patients discharged the day of surgery will not be allowed to drive home.  Name and phone number of your driver: N/A  Special Instructions: CHG Shower Use Special Wash: 1/2 bottle night before surgery and 1/2 bottle morning of surgery.   Please read over the following fact sheets that you were given: Pain Booklet, Coughing and Deep Breathing, MRSA Information and Surgical Site Infection Prevention

## 2011-09-20 NOTE — Telephone Encounter (Signed)
Mr. Solem is calling back because [facility] has not gotten the Rx yet.  I let him know that the message has been forwarded on to Dr. Jennette Kettle.

## 2011-09-20 NOTE — Telephone Encounter (Signed)
[name] [name] needs a Rx for Glipizide before he goes in to surgery on Wednesday sent to [place] at [facility].

## 2011-09-24 ENCOUNTER — Encounter (HOSPITAL_COMMUNITY): Payer: Self-pay

## 2011-09-24 MED ORDER — GLIPIZIDE ER 10 MG PO TB24: 10.0000 mg | ORAL_TABLET | Freq: Two times a day (BID) | ORAL | Status: DC

## 2011-09-24 MED ORDER — CEFAZOLIN SODIUM-DEXTROSE 2-3 GM-% IV SOLR
2.0000 g | INTRAVENOUS | Status: AC
  Administered 2011-09-25: 2 g via INTRAVENOUS
  Filled 2011-09-24: qty 50

## 2011-09-24 NOTE — Consult Note (Signed)
Anesthesia:  55 year old male for lumbar diskectomy on 09/25/11.  Hx of uncontrolled HTN, DM, OSA, obesity, anxiety, hypothyroidism, and mild RI.  Asked to review labs/CXR/EKG.  PLT clumped (previous PLT 130), so will repeat DOS.  Patient was referred to Dr. Jens Som preoperatively for clearance.  He was actually admitted to the hospital after his initial visit d/t BP 200/140.  He has since been restarted on antihypertensive medication, had an echocardiogram, and cleared to proceed from a Cardiac standpoint. (See D/C Summary by Ronie Spies, PA-C from 09/19/11.)  BP at PAT was 140/92.  The patient has a hx of a cardiac cath in 2002 showing normal coronaries.  He also had an stress test done by Dr. Deborah Chalk on 06/07/08 which by notes revealed normal perfusion and EF 37% felt r/t hypertensive heart disease.  (Copy not requested since > 70 years old.)  Dr. Jens Som repeated an echocardiogram on 09/12/11 which now shows EF 50-55%, mod-sev LVH, finding c/w grade 1 diastolic dysfunction, mildly dilated atriums.  Plan to proceed if no acute CV symptoms and f/u labs acceptable.

## 2011-09-24 NOTE — Progress Notes (Signed)
Chart reviewed.  Noted Abnormal HGB level, Abn EKG and Abnormal CXR.  Volney American, Anesthesia PA notified to review.//L. Jaymi Tinner,RN

## 2011-09-24 NOTE — Telephone Encounter (Signed)
Spoke with patient and informed him of the script sent in.  He is having back surgery at [facility] tomorrow, he has to be there at 10am

## 2011-09-24 NOTE — Telephone Encounter (Signed)
Dear [facility] Asters Team It has been sent What surgery is he having---is it back surgery? Will he be at [facility] or [facility]? Just want to be kept in the loop--- [facility]! Denny Levy

## 2011-09-25 ENCOUNTER — Ambulatory Visit (HOSPITAL_COMMUNITY): Payer: Medicare Other | Admitting: Vascular Surgery

## 2011-09-25 ENCOUNTER — Ambulatory Visit (HOSPITAL_COMMUNITY): Payer: Medicare Other

## 2011-09-25 ENCOUNTER — Encounter (HOSPITAL_COMMUNITY): Payer: Self-pay | Admitting: *Deleted

## 2011-09-25 ENCOUNTER — Inpatient Hospital Stay (HOSPITAL_COMMUNITY)
Admission: RE | Admit: 2011-09-25 | Discharge: 2011-09-27 | DRG: 490 | Disposition: A | Discharge status: Home / Self Care | Payer: Medicare Other | Source: Ambulatory Visit | Attending: Neurosurgery | Admitting: Neurosurgery

## 2011-09-25 ENCOUNTER — Encounter (HOSPITAL_COMMUNITY)
Admission: RE | Disposition: A | Discharge status: Home / Self Care | Payer: Self-pay | Source: Ambulatory Visit | Attending: Neurosurgery

## 2011-09-25 ENCOUNTER — Encounter (HOSPITAL_COMMUNITY): Payer: Self-pay | Admitting: Vascular Surgery

## 2011-09-25 DIAGNOSIS — G909 Disorder of the autonomic nervous system, unspecified: Secondary | ICD-10-CM | POA: Diagnosis present

## 2011-09-25 DIAGNOSIS — F411 Generalized anxiety disorder: Secondary | ICD-10-CM | POA: Diagnosis present

## 2011-09-25 DIAGNOSIS — M5126 Other intervertebral disc displacement, lumbar region: Principal | ICD-10-CM | POA: Diagnosis present

## 2011-09-25 DIAGNOSIS — Z96659 Presence of unspecified artificial knee joint: Secondary | ICD-10-CM

## 2011-09-25 DIAGNOSIS — Z79899 Other long term (current) drug therapy: Secondary | ICD-10-CM

## 2011-09-25 DIAGNOSIS — M1710 Unilateral primary osteoarthritis, unspecified knee: Secondary | ICD-10-CM | POA: Diagnosis present

## 2011-09-25 DIAGNOSIS — Z87891 Personal history of nicotine dependence: Secondary | ICD-10-CM

## 2011-09-25 DIAGNOSIS — Z01812 Encounter for preprocedural laboratory examination: Secondary | ICD-10-CM

## 2011-09-25 DIAGNOSIS — I10 Essential (primary) hypertension: Secondary | ICD-10-CM | POA: Diagnosis present

## 2011-09-25 DIAGNOSIS — E039 Hypothyroidism, unspecified: Secondary | ICD-10-CM | POA: Diagnosis present

## 2011-09-25 DIAGNOSIS — E1149 Type 2 diabetes mellitus with other diabetic neurological complication: Secondary | ICD-10-CM | POA: Diagnosis present

## 2011-09-25 DIAGNOSIS — Z794 Long term (current) use of insulin: Secondary | ICD-10-CM

## 2011-09-25 HISTORY — PX: LUMBAR LAMINECTOMY/DECOMPRESSION MICRODISCECTOMY: SHX5026

## 2011-09-25 LAB — GLUCOSE, CAPILLARY
Glucose-Capillary: 165 mg/dL — ABNORMAL HIGH (ref 70–99)
Glucose-Capillary: 118 mg/dL — ABNORMAL HIGH (ref 70–99)
Glucose-Capillary: 183 mg/dL — ABNORMAL HIGH (ref 70–99)

## 2011-09-25 LAB — CBC
HCT: 48.4 % (ref 39.0–52.0)
MCV: 82.6 fL (ref 78.0–100.0)
RBC: 5.86 MIL/uL — ABNORMAL HIGH (ref 4.22–5.81)
RDW: 14.2 % (ref 11.5–15.5)
WBC: 5.2 10*3/uL (ref 4.0–10.5)

## 2011-09-25 SURGERY — LUMBAR LAMINECTOMY/DECOMPRESSION MICRODISCECTOMY
Anesthesia: General | Laterality: Right | Wound class: Clean

## 2011-09-25 MED ORDER — DOXAZOSIN MESYLATE 8 MG PO TABS
8.0000 mg | ORAL_TABLET | Freq: Every day | ORAL | Status: DC
  Administered 2011-09-26 – 2011-09-27 (×2): 8 mg via ORAL
  Filled 2011-09-25 (×2): qty 1

## 2011-09-25 MED ORDER — INSULIN GLARGINE 100 UNIT/ML ~~LOC~~ SOLN
40.0000 [IU] | Freq: Every day | SUBCUTANEOUS | Status: DC
  Administered 2011-09-25: 25 [IU] via SUBCUTANEOUS
  Administered 2011-09-26 – 2011-09-27 (×2): 40 [IU] via SUBCUTANEOUS
  Filled 2011-09-25: qty 3

## 2011-09-25 MED ORDER — ONDANSETRON HCL 4 MG/2ML IJ SOLN
INTRAMUSCULAR | Status: DC | PRN
  Administered 2011-09-25: 4 mg via INTRAVENOUS

## 2011-09-25 MED ORDER — HYDRALAZINE HCL 25 MG PO TABS
25.0000 mg | ORAL_TABLET | Freq: Three times a day (TID) | ORAL | Status: DC
  Administered 2011-09-25 – 2011-09-27 (×5): 25 mg via ORAL
  Filled 2011-09-25 (×7): qty 1

## 2011-09-25 MED ORDER — ONDANSETRON HCL 4 MG/2ML IJ SOLN: 4.0000 mg | INTRAMUSCULAR | Status: DC | PRN

## 2011-09-25 MED ORDER — ONDANSETRON HCL 4 MG/2ML IJ SOLN: 4.0000 mg | Freq: Once | INTRAMUSCULAR | Status: DC | PRN

## 2011-09-25 MED ORDER — DOCUSATE SODIUM 100 MG PO CAPS
100.0000 mg | ORAL_CAPSULE | Freq: Two times a day (BID) | ORAL | Status: DC
  Administered 2011-09-25 – 2011-09-27 (×5): 100 mg via ORAL
  Filled 2011-09-25 (×5): qty 1

## 2011-09-25 MED ORDER — KCL IN DEXTROSE-NACL 30-5-0.45 MEQ/L-%-% IV SOLN
INTRAVENOUS | Status: DC
  Administered 2011-09-25: 19:00:00 via INTRAVENOUS
  Filled 2011-09-25 (×2): qty 1000

## 2011-09-25 MED ORDER — BENAZEPRIL HCL 40 MG PO TABS
40.0000 mg | ORAL_TABLET | Freq: Every day | ORAL | Status: DC
  Administered 2011-09-25 – 2011-09-27 (×3): 40 mg via ORAL
  Filled 2011-09-25 (×3): qty 1

## 2011-09-25 MED ORDER — SODIUM CHLORIDE 0.9 % IJ SOLN
3.0000 mL | Freq: Two times a day (BID) | INTRAMUSCULAR | Status: DC
  Administered 2011-09-25: 3 mL via INTRAVENOUS

## 2011-09-25 MED ORDER — MORPHINE SULFATE 4 MG/ML IJ SOLN
4.0000 mg | INTRAMUSCULAR | Status: DC | PRN
  Administered 2011-09-25 – 2011-09-27 (×3): 4 mg via INTRAVENOUS
  Filled 2011-09-25 (×2): qty 1

## 2011-09-25 MED ORDER — FUROSEMIDE 40 MG PO TABS
40.0000 mg | ORAL_TABLET | Freq: Every day | ORAL | Status: DC | PRN
  Filled 2011-09-25: qty 2

## 2011-09-25 MED ORDER — ACETAMINOPHEN 650 MG RE SUPP: 650.0000 mg | RECTAL | Status: DC | PRN

## 2011-09-25 MED ORDER — BUPIVACAINE LIPOSOME 1.3 % IJ SUSP
INTRAMUSCULAR | Status: DC | PRN
  Administered 2011-09-25: 20 mL

## 2011-09-25 MED ORDER — NEOSTIGMINE METHYLSULFATE 1 MG/ML IJ SOLN
INTRAMUSCULAR | Status: DC | PRN
  Administered 2011-09-25: 3 mg via INTRAVENOUS

## 2011-09-25 MED ORDER — AMLODIPINE BESYLATE 10 MG PO TABS
10.0000 mg | ORAL_TABLET | Freq: Every day | ORAL | Status: DC
  Administered 2011-09-26 – 2011-09-27 (×2): 10 mg via ORAL
  Filled 2011-09-25 (×2): qty 1

## 2011-09-25 MED ORDER — SODIUM CHLORIDE 0.9 % IR SOLN
Status: DC | PRN
  Administered 2011-09-25: 1000 mL

## 2011-09-25 MED ORDER — ZOLPIDEM TARTRATE 10 MG PO TABS: 10.0000 mg | ORAL_TABLET | Freq: Every evening | ORAL | Status: DC | PRN

## 2011-09-25 MED ORDER — MENTHOL 3 MG MT LOZG: 1.0000 | LOZENGE | OROMUCOSAL | Status: DC | PRN

## 2011-09-25 MED ORDER — LACTATED RINGERS IV SOLN
1000.0000 mL | INTRAVENOUS | Status: DC
  Administered 2011-09-25 (×3): via INTRAVENOUS

## 2011-09-25 MED ORDER — PHENOL 1.4 % MT LIQD: 1.0000 | OROMUCOSAL | Status: DC | PRN

## 2011-09-25 MED ORDER — POTASSIUM CHLORIDE CRYS ER 10 MEQ PO TBCR: 10.0000 meq | EXTENDED_RELEASE_TABLET | Freq: Two times a day (BID) | ORAL | Status: DC

## 2011-09-25 MED ORDER — HYDROMORPHONE HCL PF 1 MG/ML IJ SOLN
0.2500 mg | INTRAMUSCULAR | Status: DC | PRN
Start: 2011-09-25 — End: 2011-09-25
  Administered 2011-09-25 (×4): 0.5 mg via INTRAVENOUS

## 2011-09-25 MED ORDER — BUSPIRONE HCL 10 MG PO TABS
10.0000 mg | ORAL_TABLET | Freq: Every day | ORAL | Status: DC
  Administered 2011-09-25 – 2011-09-27 (×3): 10 mg via ORAL
  Filled 2011-09-25 (×3): qty 1

## 2011-09-25 MED ORDER — SIMVASTATIN 20 MG PO TABS
20.0000 mg | ORAL_TABLET | Freq: Every day | ORAL | Status: DC
  Administered 2011-09-25 – 2011-09-27 (×3): 20 mg via ORAL
  Filled 2011-09-25 (×3): qty 1

## 2011-09-25 MED ORDER — ALLOPURINOL 100 MG PO TABS
100.0000 mg | ORAL_TABLET | Freq: Every day | ORAL | Status: DC
  Administered 2011-09-25 – 2011-09-27 (×3): 100 mg via ORAL
  Filled 2011-09-25 (×3): qty 1

## 2011-09-25 MED ORDER — THROMBIN 5000 UNITS EX KIT
PACK | CUTANEOUS | Status: DC | PRN
  Administered 2011-09-25: 5000 [IU] via TOPICAL

## 2011-09-25 MED ORDER — LEVOTHYROXINE SODIUM 50 MCG PO TABS
50.0000 ug | ORAL_TABLET | Freq: Every day | ORAL | Status: DC
  Administered 2011-09-25 – 2011-09-27 (×3): 50 ug via ORAL
  Filled 2011-09-25 (×3): qty 1

## 2011-09-25 MED ORDER — HYDRALAZINE HCL 10 MG PO TABS
25.0000 mg | ORAL_TABLET | Freq: Three times a day (TID) | ORAL | Status: DC
  Filled 2011-09-25: qty 3

## 2011-09-25 MED ORDER — METFORMIN HCL 500 MG PO TABS: 1000.0000 mg | ORAL_TABLET | Freq: Two times a day (BID) | ORAL | Status: DC

## 2011-09-25 MED ORDER — EPHEDRINE SULFATE 50 MG/ML IJ SOLN
INTRAMUSCULAR | Status: DC | PRN
  Administered 2011-09-25 (×2): 10 mg via INTRAVENOUS
  Administered 2011-09-25: 5 mg via INTRAVENOUS

## 2011-09-25 MED ORDER — SODIUM CHLORIDE 0.9 % IJ SOLN: 3.0000 mL | INTRAMUSCULAR | Status: DC | PRN

## 2011-09-25 MED ORDER — FUROSEMIDE 40 MG PO TABS
40.0000 mg | ORAL_TABLET | Freq: Every day | ORAL | Status: DC
  Filled 2011-09-25: qty 1

## 2011-09-25 MED ORDER — CEFAZOLIN SODIUM 1-5 GM-% IV SOLN
1.0000 g | Freq: Three times a day (TID) | INTRAVENOUS | Status: AC
  Administered 2011-09-25 – 2011-09-26 (×2): 1 g via INTRAVENOUS
  Filled 2011-09-25 (×2): qty 50

## 2011-09-25 MED ORDER — AMITRIPTYLINE HCL 50 MG PO TABS
50.0000 mg | ORAL_TABLET | Freq: Every day | ORAL | Status: DC
  Administered 2011-09-25 – 2011-09-26 (×2): 50 mg via ORAL
  Filled 2011-09-25 (×3): qty 1

## 2011-09-25 MED ORDER — POTASSIUM CHLORIDE CRYS ER 10 MEQ PO TBCR
10.0000 meq | EXTENDED_RELEASE_TABLET | Freq: Every day | ORAL | Status: DC
  Administered 2011-09-26 – 2011-09-27 (×2): 10 meq via ORAL
  Filled 2011-09-25 (×2): qty 1

## 2011-09-25 MED ORDER — GLIPIZIDE ER 10 MG PO TB24
10.0000 mg | ORAL_TABLET | Freq: Two times a day (BID) | ORAL | Status: DC
  Administered 2011-09-25 – 2011-09-27 (×3): 10 mg via ORAL
  Filled 2011-09-25 (×5): qty 1

## 2011-09-25 MED ORDER — MORPHINE SULFATE 4 MG/ML IJ SOLN
INTRAMUSCULAR | Status: AC
  Administered 2011-09-25: 4 mg via INTRAVENOUS
  Filled 2011-09-25: qty 1

## 2011-09-25 MED ORDER — COLCHICINE 0.6 MG PO TABS
0.6000 mg | ORAL_TABLET | Freq: Two times a day (BID) | ORAL | Status: DC
  Administered 2011-09-25 – 2011-09-27 (×4): 0.6 mg via ORAL
  Filled 2011-09-25 (×5): qty 1

## 2011-09-25 MED ORDER — METFORMIN HCL 500 MG PO TABS
1000.0000 mg | ORAL_TABLET | Freq: Two times a day (BID) | ORAL | Status: DC
  Administered 2011-09-26 – 2011-09-27 (×3): 1000 mg via ORAL
  Filled 2011-09-25 (×5): qty 2

## 2011-09-25 MED ORDER — METOPROLOL TARTRATE 100 MG PO TABS
100.0000 mg | ORAL_TABLET | Freq: Two times a day (BID) | ORAL | Status: DC
  Administered 2011-09-25 – 2011-09-27 (×4): 100 mg via ORAL
  Filled 2011-09-25 (×5): qty 1

## 2011-09-25 MED ORDER — SODIUM CHLORIDE 0.9 % IV SOLN: 250.0000 mL | INTRAVENOUS | Status: DC

## 2011-09-25 MED ORDER — ACETAMINOPHEN 325 MG PO TABS: 650.0000 mg | ORAL_TABLET | ORAL | Status: DC | PRN

## 2011-09-25 MED ORDER — GLYCOPYRROLATE 0.2 MG/ML IJ SOLN
INTRAMUSCULAR | Status: DC | PRN
  Administered 2011-09-25: .4 mg via INTRAVENOUS

## 2011-09-25 MED ORDER — OXYCODONE-ACETAMINOPHEN 5-325 MG PO TABS
1.0000 | ORAL_TABLET | ORAL | Status: DC | PRN
  Administered 2011-09-25 – 2011-09-27 (×8): 2 via ORAL
  Filled 2011-09-25 (×8): qty 2

## 2011-09-25 MED ORDER — BUPIVACAINE LIPOSOME 1.3 % IJ SUSP
20.0000 mL | Freq: Once | INTRAMUSCULAR | Status: DC
  Filled 2011-09-25: qty 20

## 2011-09-25 SURGICAL SUPPLY — 50 items
BENZOIN TINCTURE PRP APPL 2/3 (GAUZE/BANDAGES/DRESSINGS) ×2 IMPLANT
BLADE SURG ROTATE 9660 (MISCELLANEOUS) IMPLANT
BUR ACORN 6.0 (BURR) ×2 IMPLANT
BUR MATCHSTICK NEURO 3.0 LAGG (BURR) ×2 IMPLANT
CANISTER SUCTION 2500CC (MISCELLANEOUS) ×2 IMPLANT
CLOTH BEACON ORANGE TIMEOUT ST (SAFETY) ×2 IMPLANT
CONT SPEC 4OZ CLIKSEAL STRL BL (MISCELLANEOUS) ×2 IMPLANT
DRAPE LAPAROTOMY 100X72X124 (DRAPES) ×2 IMPLANT
DRAPE MICROSCOPE LEICA (MISCELLANEOUS) ×2 IMPLANT
DRAPE POUCH INSTRU U-SHP 10X18 (DRAPES) ×2 IMPLANT
DRSG PAD ABDOMINAL 8X10 ST (GAUZE/BANDAGES/DRESSINGS) IMPLANT
DURAPREP 26ML APPLICATOR (WOUND CARE) ×2 IMPLANT
ELECT REM PT RETURN 9FT ADLT (ELECTROSURGICAL) ×2
ELECTRODE REM PT RTRN 9FT ADLT (ELECTROSURGICAL) ×1 IMPLANT
GAUZE SPONGE 4X4 16PLY XRAY LF (GAUZE/BANDAGES/DRESSINGS) IMPLANT
GLOVE BIOGEL M 8.0 STRL (GLOVE) ×2 IMPLANT
GLOVE EXAM NITRILE LRG STRL (GLOVE) IMPLANT
GLOVE EXAM NITRILE MD LF STRL (GLOVE) IMPLANT
GLOVE EXAM NITRILE XL STR (GLOVE) IMPLANT
GLOVE EXAM NITRILE XS STR PU (GLOVE) IMPLANT
GLOVE SURG SS PI 8.0 STRL IVOR (GLOVE) ×2 IMPLANT
GOWN BRE IMP SLV AUR LG STRL (GOWN DISPOSABLE) ×4 IMPLANT
GOWN BRE IMP SLV AUR XL STRL (GOWN DISPOSABLE) IMPLANT
GOWN STRL REIN 2XL LVL4 (GOWN DISPOSABLE) ×2 IMPLANT
KIT BASIN OR (CUSTOM PROCEDURE TRAY) ×2 IMPLANT
KIT ROOM TURNOVER OR (KITS) ×2 IMPLANT
NEEDLE HYPO 18GX1.5 BLUNT FILL (NEEDLE) IMPLANT
NEEDLE HYPO 21X1.5 SAFETY (NEEDLE) ×2 IMPLANT
NEEDLE HYPO 25X1 1.5 SAFETY (NEEDLE) ×2 IMPLANT
NEEDLE SPNL 20GX3.5 QUINCKE YW (NEEDLE) ×2 IMPLANT
NS IRRIG 1000ML POUR BTL (IV SOLUTION) ×2 IMPLANT
PACK LAMINECTOMY NEURO (CUSTOM PROCEDURE TRAY) ×2 IMPLANT
PAD ARMBOARD 7.5X6 YLW CONV (MISCELLANEOUS) ×6 IMPLANT
PATTIES SURGICAL .5 X1 (DISPOSABLE) ×2 IMPLANT
RUBBERBAND STERILE (MISCELLANEOUS) ×4 IMPLANT
SPONGE GAUZE 4X4 12PLY (GAUZE/BANDAGES/DRESSINGS) ×2 IMPLANT
SPONGE LAP 4X18 X RAY DECT (DISPOSABLE) IMPLANT
SPONGE SURGIFOAM ABS GEL SZ50 (HEMOSTASIS) ×2 IMPLANT
STRIP CLOSURE SKIN 1/2X4 (GAUZE/BANDAGES/DRESSINGS) ×2 IMPLANT
SUT VIC AB 0 CT1 18XCR BRD8 (SUTURE) ×1 IMPLANT
SUT VIC AB 0 CT1 8-18 (SUTURE) ×1
SUT VIC AB 2-0 CP2 18 (SUTURE) ×2 IMPLANT
SUT VIC AB 3-0 SH 8-18 (SUTURE) ×2 IMPLANT
SYR 20CC LL (SYRINGE) ×2 IMPLANT
SYR 20ML ECCENTRIC (SYRINGE) ×2 IMPLANT
SYR 5ML LL (SYRINGE) IMPLANT
TAPE CLOTH SURG 4X10 WHT LF (GAUZE/BANDAGES/DRESSINGS) ×2 IMPLANT
TOWEL OR 17X24 6PK STRL BLUE (TOWEL DISPOSABLE) ×2 IMPLANT
TOWEL OR 17X26 10 PK STRL BLUE (TOWEL DISPOSABLE) ×2 IMPLANT
WATER STERILE IRR 1000ML POUR (IV SOLUTION) ×2 IMPLANT

## 2011-09-25 NOTE — Progress Notes (Signed)
Respiratory Care Notes 21:15 Patient placed on CPAP of 12 CmH20 per home settings with 3L of oxygen bleed in. He used his own nasal mask. He tolerated the CPAP very well and said it feels just like home. RT will continue to monitor. Janece Canterbury RRT, RCP

## 2011-09-25 NOTE — Transfer of Care (Signed)
Immediate Anesthesia Transfer of Care Note  Patient: [name] [name]  Procedure(s) Performed:  LUMBAR LAMINECTOMY/DECOMPRESSION MICRODISCECTOMY - Right Lumbar Five-Sacral One Microdiscectomy  Patient Location: PACU  Anesthesia Type: General  Level of Consciousness: awake, alert  and oriented  Airway & Oxygen Therapy: Patient Spontanous Breathing and Patient connected to nasal cannula oxygen  Post-op Assessment: Report given to PACU RN and Post -op Vital signs reviewed and stable  Post vital signs: Reviewed and stable  Complications: No apparent anesthesia complications

## 2011-09-25 NOTE — Anesthesia Preprocedure Evaluation (Addendum)
Anesthesia Evaluation  Patient identified by MRN, date of birth, ID band Patient awake    Reviewed: Allergy & Precautions, H&P , NPO status , Patient's Chart, lab work & pertinent test results  Airway Mallampati: II TM Distance: >3 FB Neck ROM: full    Dental  (+) Poor Dentition and Edentulous Upper   Pulmonary sleep apnea , former smoker   Pulmonary exam normal       Cardiovascular hypertension, + CAD regular Normal    Neuro/Psych  Neuromuscular disease    GI/Hepatic negative GI ROS, Neg liver ROS,   Endo/Other  Diabetes mellitus-, Well Controlled, Type 1, Insulin Dependent and Oral Hypoglycemic AgentsMorbid obesity  Renal/GU negative Renal ROS  Genitourinary negative   Musculoskeletal  (+) Arthritis -,   Abdominal   Peds  Hematology   Anesthesia Other Findings   Reproductive/Obstetrics                          Anesthesia Physical Anesthesia Plan  ASA: III  Anesthesia Plan: General   Post-op Pain Management:    Induction: Intravenous  Airway Management Planned: Oral ETT  Additional Equipment:   Intra-op Plan:   Post-operative Plan:   Informed Consent: I have reviewed the patients History and Physical, chart, labs and discussed the procedure including the risks, benefits and alternatives for the proposed anesthesia with the patient or authorized representative who has indicated his/her understanding and acceptance.   Dental advisory given  Plan Discussed with: Anesthesiologist, CRNA and Surgeon  Anesthesia Plan Comments:         Anesthesia Quick Evaluation

## 2011-09-25 NOTE — H&P (Signed)
[name] [name] is an 55 y.o. male.   Chief Complaint: lumbar pain with radiation to the right leg [id number] august of this year he developed pain in the lower back with radiation to the right leg around the calf.  Past Medical History  Diagnosis Date  . Renal insufficiency, mild   . OA (osteoarthritis) of knee     right; mod-severe  . DM (diabetes mellitus), type 2     mild mod sensory loss plantar feet  . OA (osteoarthritis)   . History of colonoscopy   . Peripheral autonomic neuropathy due to secondary diabetes   . HTN (hypertension)     labile  . Hypothyroidism     since thyroidectomy  . Anxiety     takes buspar  . Sleep apnea     on cpap    Past Surgical History  Procedure Date  . Total knee arthroplasty 2001    left  . Thyroidectomy, partial     remote  . Cardiolite--no cad ef 34% 01/01/2005  . Joint replacement 2000    left knee     Family History  Problem Relation Age of Onset  . Diabetes Mother   . Hypertension Mother   . Aneurysm Mother    Social History:  reports that he quit smoking about 2 weeks ago. His smoking use included Cigarettes. He has a 28 pack-year smoking history. He has never used smokeless tobacco. He reports that he does not drink alcohol. His drug history not on file.  Allergies:  Allergies  Allergen Reactions  . Shellfish Allergy Other (See Comments)    Reaction unknown    Medications Prior to Admission  Medication Dose Route Frequency Provider Last Rate Last Dose  . ceFAZolin (ANCEF) IVPB 2 g/50 mL premix  2 g Intravenous 60 min Pre-Op Fredrik Rigger, PHARMD       Medications Prior to Admission  Medication Sig Dispense Refill  . allopurinol (ZYLOPRIM) 100 MG tablet Take 1 tablet (100 mg total) by mouth daily.  90 tablet  3  . amitriptyline (ELAVIL) 25 MG tablet Take 50 mg by mouth at bedtime.        Marland Kitchen amLODipine (NORVASC) 10 MG tablet Take 1 tablet (10 mg total) by mouth daily.  90 tablet  3  . benazepril (LOTENSIN) 40 MG  tablet Take 1 tablet (40 mg total) by mouth daily.  90 tablet  3  . busPIRone (BUSPAR) 10 MG tablet Take 10 mg by mouth daily.        . colchicine 0.6 MG tablet Take 1 tablet (0.6 mg total) by mouth 2 (two) times daily.  180 tablet  3  . doxazosin (CARDURA) 8 MG tablet Take 1 tablet (8 mg total) by mouth daily.  90 tablet  3  . furosemide (LASIX) 80 MG tablet Take 40-80 mg by mouth daily as needed. for lower extremity edema       . hydrALAZINE (APRESOLINE) 25 MG tablet Take 1 tablet (25 mg total) by mouth 3 (three) times daily.  270 tablet  11  . HYDROcodone-acetaminophen (NORCO) 5-325 MG per tablet Take 1 tablet by mouth every 6 (six) hours as needed. For pain       . insulin glargine (LANTUS) 100 UNIT/ML injection Inject 50 Units into the skin as directed. Sig 50 units once daily as directed disp 3 months      . metFORMIN (GLUCOPHAGE) 1000 MG tablet Take 1 tablet (1,000 mg total) by mouth 2 (two) times daily.  180 tablet  3  . metoprolol (LOPRESSOR) 100 MG tablet Take 1 tablet (100 mg total) by mouth 2 (two) times daily.  180 tablet  3  . simvastatin (ZOCOR) 20 MG tablet Take 1 tablet (20 mg total) by mouth daily.  90 tablet  3  . levothyroxine (SYNTHROID, LEVOTHROID) 50 MCG tablet Take 50 mcg by mouth daily.          Results for orders placed during the hospital encounter of 09/25/11 (from the past 48 hour(s))  GLUCOSE, CAPILLARY     Status: Abnormal   Collection Time   09/25/11  9:59 AM      Component Value Range Comment   Glucose-Capillary 165 (*) 70 - 99 (mg/dL)   CBC     Status: Abnormal   Collection Time   09/25/11 10:42 AM      Component Value Range Comment   WBC 5.2  4.0 - 10.5 (K/uL)    RBC 5.86 (*) 4.22 - 5.81 (MIL/uL)    Hemoglobin 16.6  13.0 - 17.0 (g/dL)    HCT 16.1  09.6 - 04.5 (%)    MCV 82.6  78.0 - 100.0 (fL)    MCH 28.3  26.0 - 34.0 (pg)    MCHC 34.3  30.0 - 36.0 (g/dL)    RDW 40.9  81.1 - 91.4 (%)    Platelets 101 (*) 150 - 400 (K/uL)    No results  found.  ROS anxiety,depression, sob, arterial hy[ertension.  Blood pressure 153/98, pulse 76, temperature 97.9 F (36.6 C), temperature source Oral, resp. rate 18, weight 121.11 kg (267 lb), SpO2 94.00%. Physical Exam  Came to see me limping from the right leg.heent: nl.neck:nl. Lungs:ronchii bilaterally.cardiac:no murmurs.abdomen negative. Extremities:scar in left knee, edema both legs. strenght 4/5 weakness of right footand decrease of ankle dtr.mri :hnp ar Rl5s1. ddd lumbar spine Assessment/[place] or for RIGHT L5S1 DISCECTOMY.  [name] M 09/25/2011, 12:38 PM

## 2011-09-25 NOTE — Op Note (Signed)
Brief history right leg pain since august 2012  Preoperative diagnosis:right L5s1 HNP  Postoperative diagnosis:SAME  Procedure:Intervertebral discectomy using micro-dissection  Surgeon: Dr. Hilda Lias  Asst.:[facility]  Anesthesia: Gen. endotracheal  Estimated blood loss:50CC  Drains: None  Complications: None  Description of procedure: The patient was brought to the operating room by the anesthesia team. General endotracheal anesthesia was induced. The patient was turned to the prone position on the Wilson frame. The patient's lumbosacral region was then prepared with  Duraprep. Sterile drapes were applied.  I then injected the area to be incised with Marcaine with epinephrine solution. I then used a scalpel to make a linear midline incision over thel5s1intervertebral disc space. I then used electrocautery to perform arightsided subperiosteal dissection exposing the spinous process and lamina of l5. We obtained intraoperative radiograph to confirm our location. I then inserted the [facility] retractor for exposure.  We then brought the operative microscope into the field. Under its magnification and illumination we completed the microdissection. I used a high-speed drill to perform a laminotomy at right l5s1. I then used a Kerrison punches to widen the laminotomy and removed the ligamentum flavum atl5s1. We then used microdissection to free up the thecal sac and the s1   nerve root from the epidural tissue. I then used a Kerrison punch to perform a foraminotomy at about the s1 nerve root. We then using the nerve root retractor to gently retract the thecal sac and the s1nerve root medially. This exposed the intervertebral disc. We identified the ruptured disc and remove it with the pituitary forceps.the s1 nerve root was swollen and reddish  I then palpated along the ventral surface of the thecal sac and along exit route of the  s1  nerve root and noted that the neural structures were  well decompressed. This completed the decompression.  We then obtained hemostasis using bipolar electrocautery. We irrigated the wound out with bacitracin solution. We then removed the retractor. We then reapproximated the patient's thoracolumbar fascia with interrupted #1 Vicryl suture. We then reapproximated the patient's subcutaneous tissue with interrupted 3-0 Vicryl suture. We then reapproximated patient's skin with Steri-Strips and benzoin. The was then coated with bacitracin ointment. The drapes were removed. The patient was subsequently returned to the supine position where they were extubated by the anesthesia team. The patient was then transported to the postanesthesia care unit in stable condition. All sponge instrument and needle counts were correct at the end of this case. An operative lumbar xray was done prior to open.

## 2011-09-25 NOTE — Preoperative (Signed)
Beta Blockers   Reason not to administer Beta Blockers:Not Applicable 

## 2011-09-25 NOTE — Anesthesia Postprocedure Evaluation (Signed)
  Anesthesia Post-op Note  Patient: [name] [name]  Procedure(s) Performed:  LUMBAR LAMINECTOMY/DECOMPRESSION MICRODISCECTOMY - Right Lumbar Five-Sacral One Microdiscectomy  Patient Location: PACU  Anesthesia Type: General  Level of Consciousness: awake, alert , oriented, sedated and patient cooperative  Airway and Oxygen Therapy: Patient Spontanous Breathing and Patient connected to nasal cannula oxygen  Post-op Pain: mild  Post-op Assessment: Post-op Vital signs reviewed, Patient's Cardiovascular Status Stable, Respiratory Function Stable, Patent Airway, No signs of Nausea or vomiting and Pain level controlled  Post-op Vital Signs: stable  Complications: No apparent anesthesia complications

## 2011-09-26 ENCOUNTER — Encounter (HOSPITAL_COMMUNITY): Payer: Self-pay | Admitting: Neurosurgery

## 2011-09-26 NOTE — Progress Notes (Signed)
PT has been using CPAP machine. Says he can do it himself. As a matter of fact the PT was already on the machine when RT went to check on him. PT is tolerating machine fine and RT will continue to monitor.

## 2011-09-26 NOTE — Progress Notes (Signed)
Physical Therapy Evaluation Patient Details Name: [name] [name] MRN: [id number] DOB: [birth year] Today's Date: 09/26/2011  Problem List:  Patient Active Problem List  Diagnoses  . Unspecified hypothyroidism  . DIABETES MELLITUS, CONTROLLED, WITH COMPLICATIONS  . HYPERLIPIDEMIA  . GOUT, ACUTE  . OBESITY, NOS  . TOBACCO DEPENDENCE  . DEPRESSIVE DISORDER, NOS  . CORONARY, ARTERIOSCLEROSIS  . CHF - EJECTION FRACTION < 50%  . Other loss of teeth  . OSTEOARTHRITIS  . APNEA, SLEEP  . HYPERTENSION, BENIGN SYSTEMIC  . Renal insufficiency  . Low back pain radiating to right leg  . Radicular low back pain  . Hypertensive urgency  . Preop cardiovascular exam    Past Medical History:  Past Medical History  Diagnosis Date  . Renal insufficiency, mild   . OA (osteoarthritis) of knee     right; mod-severe  . DM (diabetes mellitus), type 2     mild mod sensory loss plantar feet  . OA (osteoarthritis)   . History of colonoscopy   . Peripheral autonomic neuropathy due to secondary diabetes   . HTN (hypertension)     labile  . Hypothyroidism     since thyroidectomy  . Anxiety     takes buspar  . Sleep apnea     on cpap   Past Surgical History:  Past Surgical History  Procedure Date  . Total knee arthroplasty 2001    left  . Thyroidectomy, partial     remote  . Cardiolite--no cad ef 34% 01/01/2005  . Joint replacement 2000    left knee   . Lumbar laminectomy/decompression microdiscectomy 09/25/2011    Procedure: LUMBAR LAMINECTOMY/DECOMPRESSION MICRODISCECTOMY;  Surgeon: Karn Cassis;  Location: [facility];  Service: Neurosurgery;  Laterality: Right;  Right Lumbar Five-Sacral One Microdiscectomy    PT Assessment/Plan/Recommendation PT Assessment Clinical Impression Statement: Patient with decline in functional activity tolerance over the course of 3 months secondary to back pain radiating to right lower extemity. Patient will benefit from gradual increase in  ambulation as pain allows to improve endurance - discussed witth patient PT Recommendation/Assessment: Patent does not need any further PT services No Skilled PT: All education completed;Patient at baseline level of functioning;Patient is supervision for all activity/mobility  PT Evaluation Precautions/Restrictions  Precautions Precautions: Back Precaution Booklet Issued: Yes (comment) Precaution Comments: Educated in posture, body, mechanics and back prescautions Prior Functioning  Home Living Lives With: Spouse Receives Help From: Family Type of Home: House Home Layout: One level Home Access: Level entry Home Adaptive Equipment: Walker - rolling Prior Function Level of Independence: Independent with homemaking with ambulation;Independent with basic ADLs Vocation: On disability Cognition Cognition Arousal/Alertness: Awake/alert Overall Cognitive Status: Appears within functional limits for tasks assessed Orientation Level: Oriented X4 Sensation/Coordination Sensation Light Touch: Appears Intact Extremity Assessment RLE Assessment RLE Assessment: Within Functional Limits LLE Assessment LLE Assessment: Within Functional Limits Mobility (including Balance) Bed Mobility Bed Mobility: Yes Rolling Right: 5: Supervision Rolling Right Details (indicate cue type and reason): Secondary to initial instruction in coorect technique - log roll Right Sidelying to Sit: 5: Supervision Right Sidelying to Sit Details (indicate cue type and reason): Secondary to education in sequencing of trunk and lower extremities Sitting - Scoot to Edge of Bed: 6: Modified independent (Device/Increase time) Sit to Supine - Right: 5: Supervision Sit to Supine - Right Details (indicate cue type and reason): for education on correct technique/sequencing Transfers Transfers: Yes Sit to Stand: 6: Modified independent (Device/Increase time);With upper extremity assist (Uses hands on surface  to initiate then on  front of thighs) Stand to Sit: 6: Modified independent (Device/Increase time) Ambulation/Gait Ambulation/Gait: Yes Ambulation/Gait Assistance: 5: Supervision Ambulation/Gait Assistance Details (indicate cue type and reason): Patient states decreased speed secondary to cautious with gait due to pain.  Ambulation Distance (Feet): 180 Feet Assistive device: None Gait Pattern: Within Functional Limits  Posture/Postural Control Posture/Postural Control: No significant limitations End of Session PT - End of Session Equipment Utilized During Treatment: Gait belt Activity Tolerance: Patient tolerated treatment well (However, limited by decreased functional activity tolerance) Patient left: in bed Nurse Communication: Mobility status for ambulation General Behavior During Session: [facility] for tasks performed Cognition: [facility] for tasks performed  Edwyna Perfect, PT  Pager [phone]  09/26/2011, 1:51 PM

## 2011-09-26 NOTE — Progress Notes (Signed)
  Stable, no pain in right leg as preop. Some drainage in wound. No weakness. Not ambulating.oob.

## 2011-09-26 NOTE — Progress Notes (Signed)
Inpatient Diabetes Program Recommendations  AACE/ADA: New Consensus Statement on Inpatient Glycemic Control (2009)  Target Ranges:  Prepandial:   less than 140 mg/dL      Peak postprandial:   less than 180 mg/dL (1-2 hours)      Critically ill patients:  140 - 180 mg/dL   Reason for Visit: Note history of diabetes.  Inpatient Diabetes Program Recommendations Insulin - Basal: Consider decreasing Lantus to 25 units daily. Correction (SSI): Consider sensitive correction tid with meals. HgbA1C: Consider ordering  A1c to assess glycemic control.  Note:

## 2011-09-27 LAB — GLUCOSE, CAPILLARY: Glucose-Capillary: 108 mg/dL — ABNORMAL HIGH (ref 70–99)

## 2011-09-27 NOTE — Progress Notes (Signed)
  No c/o,no pain ,no weakness. Wants to go home.

## 2011-09-27 NOTE — Discharge Summary (Signed)
Physician Discharge Summary  Patient ID: [name] MRN: [id number] DOB/AGE: 54/09/1956 54 y.o.  Admit date: 09/25/2011 Discharge date: 09/27/2011  Admission Diagnoses:lumbar hnp Discharge Diagnoses: same   Discharged Condition: good  Hospital Course: surgery on 09/25/2011   Consults: none  Significant Diagnostic Studies: radiology: MRI: lumbar  Treatments: surgery:  Discharge Exam: Blood pressure 171/108, pulse 82, temperature 98.6 F (37 C), temperature source Oral, resp. rate 19, height 6' (1.829 m), weight 127.1 kg (280 lb 3.3 oz), SpO2 97.00%. Neurologic: Grossly normal  Disposition: [facility] Hospital  Discharge Orders    Future Appointments: Provider: Department: Dept Phone: Center:   10/17/2011 8:45 AM Lewayne Bunting, MD [facility] [phone] [facility]     Current Discharge Medication List    CONTINUE these medications which have NOT CHANGED   Details  allopurinol (ZYLOPRIM) 100 MG tablet Take 1 tablet (100 mg total) by mouth daily. Qty: 90 tablet, Refills: 3    amitriptyline (ELAVIL) 25 MG tablet Take 50 mg by mouth at bedtime.      amLODipine (NORVASC) 10 MG tablet Take 1 tablet (10 mg total) by mouth daily. Qty: 90 tablet, Refills: 3    benazepril (LOTENSIN) 40 MG tablet Take 1 tablet (40 mg total) by mouth daily. Qty: 90 tablet, Refills: 3    busPIRone (BUSPAR) 10 MG tablet Take 10 mg by mouth daily.      colchicine 0.6 MG tablet Take 1 tablet (0.6 mg total) by mouth 2 (two) times daily. Qty: 180 tablet, Refills: 3    doxazosin (CARDURA) 8 MG tablet Take 1 tablet (8 mg total) by mouth daily. Qty: 90 tablet, Refills: 3    furosemide (LASIX) 80 MG tablet Take 40-80 mg by mouth daily as needed. for lower extremity edema    glipiZIDE (GLUCOTROL XL) 10 MG 24 hr tablet Take 1 tablet (10 mg total) by mouth 2 (two) times daily. Qty: 180 tablet, Refills: 3    hydrALAZINE (APRESOLINE) 25 MG tablet Take 1 tablet (25 mg total) by  mouth 3 (three) times daily. Qty: 270 tablet, Refills: 11   Associated Diagnoses: Essential hypertension, benign    HYDROcodone-acetaminophen (NORCO) 5-325 MG per tablet Take 1 tablet by mouth every 6 (six) hours as needed. For pain     insulin glargine (LANTUS) 100 UNIT/ML injection Inject 50 Units into the skin as directed. Sig 50 units once daily as directed disp 3 months    metFORMIN (GLUCOPHAGE) 1000 MG tablet Take 1 tablet (1,000 mg total) by mouth 2 (two) times daily. Qty: 180 tablet, Refills: 3    metoprolol (LOPRESSOR) 100 MG tablet Take 1 tablet (100 mg total) by mouth 2 (two) times daily. Qty: 180 tablet, Refills: 3    potassium chloride (KLOR-CON 10) 10 MEQ CR tablet Take 1 tablet (10 mEq total) by mouth daily. Qty: 30 tablet, Refills: 12    simvastatin (ZOCOR) 20 MG tablet Take 1 tablet (20 mg total) by mouth daily. Qty: 90 tablet, Refills: 3    levothyroxine (SYNTHROID, LEVOTHROID) 50 MCG tablet Take 50 mcg by mouth daily.         Follow-up Information    Follow up with [name] Make an appointment in 3 weeks. (call as needed)    Contact information:   [address], [address] [place] [address] [phone]          Signed: Karn Cassis 09/27/2011, 11:44 AM

## 2011-09-27 NOTE — Progress Notes (Signed)
Utilization review completed. Jeannemarie Sawaya, RN, BSN. 09/27/11 

## 2011-09-27 NOTE — Discharge Summary (Signed)
Physical Therapy Discharge Summary  Subjective:    d/c     Objective:    Bed Mobility: w/c Transfers: supervised Mobility: supervised Stairs: supervised Wheelchair Mobility: for d/c, distance: from room 3015 to [facility] entrance     Assessment summary:    The patient presented with @DX @ and has made great progress with therapies meeting Supervision / Standby Assist goals. The patient currently presents with the following functional deficits: Supervision / Standby Assist.    Plan:   The following equipment: walker The patient will be discharged to home with wife as follow up services recommended.

## 2011-10-07 ENCOUNTER — Other Ambulatory Visit: Payer: Self-pay | Admitting: Family Medicine

## 2011-10-07 ENCOUNTER — Telehealth: Payer: Self-pay | Admitting: Family Medicine

## 2011-10-07 MED ORDER — OXYCODONE HCL 15 MG PO TB12: ORAL_TABLET | ORAL | Status: DC

## 2011-10-07 NOTE — Telephone Encounter (Signed)
rx picked up by pt's wife.[name] [name]

## 2011-10-07 NOTE — Telephone Encounter (Signed)
Wife is calling because he is in terrible pain after the surgery and needs some pain medication.

## 2011-10-07 NOTE — Telephone Encounter (Signed)
Wife stated that he is still in pain and has been taking the 15 mg oxycodone and it is not helping him with the pain. She contacted the surgeon's office that did his operation is out for the week she wanted to know if she can get something stronger for him to take..he uses [facility] [address]. Forwarded to pcp.Loralee Pacas [place]

## 2011-10-07 NOTE — Telephone Encounter (Signed)
Rx given and wife to pick up

## 2011-10-14 ENCOUNTER — Other Ambulatory Visit: Payer: Self-pay | Admitting: *Deleted

## 2011-10-14 ENCOUNTER — Other Ambulatory Visit: Payer: Self-pay | Admitting: Family Medicine

## 2011-10-14 MED ORDER — FUROSEMIDE 80 MG PO TABS: 40.0000 mg | ORAL_TABLET | Freq: Every day | ORAL | Status: DC | PRN

## 2011-10-14 MED ORDER — OXYCODONE HCL 30 MG PO TABS: ORAL_TABLET | ORAL | Status: DC

## 2011-10-17 ENCOUNTER — Ambulatory Visit (INDEPENDENT_AMBULATORY_CARE_PROVIDER_SITE_OTHER): Payer: Medicare Other | Admitting: Cardiology

## 2011-10-17 ENCOUNTER — Encounter: Payer: Self-pay | Admitting: Cardiology

## 2011-10-17 VITALS — BP 166/107 | HR 88 | Ht 72.0 in | Wt 257.0 lb

## 2011-10-17 DIAGNOSIS — E039 Hypothyroidism, unspecified: Secondary | ICD-10-CM

## 2011-10-17 DIAGNOSIS — I10 Essential (primary) hypertension: Secondary | ICD-10-CM

## 2011-10-17 DIAGNOSIS — E118 Type 2 diabetes mellitus with unspecified complications: Secondary | ICD-10-CM

## 2011-10-17 DIAGNOSIS — E785 Hyperlipidemia, unspecified: Secondary | ICD-10-CM

## 2011-10-17 LAB — BASIC METABOLIC PANEL
Calcium: 8.9 mg/dL (ref 8.4–10.5)
GFR: 74.42 mL/min (ref >60.00)
Potassium: 3.3 mEq/L — ABNORMAL LOW (ref 3.5–5.1)
Sodium: 138 mEq/L (ref 135–145)

## 2011-10-17 MED ORDER — HYDRALAZINE HCL 50 MG PO TABS: 50.0000 mg | ORAL_TABLET | Freq: Three times a day (TID) | ORAL | Status: DC

## 2011-10-17 MED ORDER — POTASSIUM CHLORIDE CRYS ER 10 MEQ PO TBCR: EXTENDED_RELEASE_TABLET | ORAL | Status: DC

## 2011-10-17 NOTE — Assessment & Plan Note (Signed)
Management per primary care. 

## 2011-10-17 NOTE — Assessment & Plan Note (Signed)
I have asked the patient to followup with his primary care physician for recent mild elevation in TSH.

## 2011-10-17 NOTE — Patient Instructions (Signed)
Your physician wants you to follow-up in: 6 months You will receive a reminder letter in the mail two months in advance. If you don't receive a letter, please call our office to schedule the follow-up appointment.  Increase Hydralazine to 50 mg three times a day  Your physician recommends that you return for lab work in: TODAY

## 2011-10-17 NOTE — Progress Notes (Signed)
Addended by: Sharin Grave on: 10/17/2011 05:34 PM   Modules accepted: Orders

## 2011-10-17 NOTE — Progress Notes (Signed)
HPI:55 yo male previously followed by Dr. Deborah Chalk for Fu of hypertension. Cath in Feb 2002 revealed EF 50-55 and normal coronaries. CTA Jan 2006 showed no RAS. Last myoview 06/07/08 and revealed normal perfusion and EF 37 felt related to hypertensive heart disease. Echocardiogram in Nov 2012 showed EF 50-55, moderate to severe LVH and mild biatrial enlargement. Patient admitted in October with hypertensive urgency. Medications were resumed and his blood pressure improved. TSH was mildly elevated. He has subsequently had back surgery. Since then he denies dyspnea, chest pain or pedal edema. He continues to have problems with back pain and right lower extremity pain.   Current Outpatient Prescriptions  Medication Sig Dispense Refill  . allopurinol (ZYLOPRIM) 100 MG tablet Take 1 tablet (100 mg total) by mouth daily.  90 tablet  3  . amLODipine (NORVASC) 10 MG tablet Take 1 tablet (10 mg total) by mouth daily.  90 tablet  3  . benazepril (LOTENSIN) 40 MG tablet Take 1 tablet (40 mg total) by mouth daily.  90 tablet  3  . doxazosin (CARDURA) 8 MG tablet Take 1 tablet (8 mg total) by mouth daily.  90 tablet  3  . furosemide (LASIX) 80 MG tablet Take 0.5-1 tablets (40-80 mg total) by mouth daily as needed. for lower extremity edema  30 tablet  4  . glipiZIDE (GLUCOTROL XL) 10 MG 24 hr tablet Take 1 tablet (10 mg total) by mouth 2 (two) times daily.  180 tablet  3  . hydrALAZINE (APRESOLINE) 50 MG tablet Take 1 tablet (50 mg total) by mouth 3 (three) times daily.  270 tablet  11  . insulin glargine (LANTUS) 100 UNIT/ML injection Inject 50 Units into the skin as directed. Sig 50 units once daily as directed disp 3 months      . KLOR-CON M10 10 MEQ tablet Take 10 mEq by mouth daily.       . metFORMIN (GLUCOPHAGE) 1000 MG tablet Take 1 tablet (1,000 mg total) by mouth 2 (two) times daily.  180 tablet  3  . metoprolol (LOPRESSOR) 100 MG tablet Take 1 tablet (100 mg total) by mouth 2 (two) times daily.  180 tablet   3  . oxyCODONE (OXYCONTIN) 15 MG TB12 Take one or two tabs by mouth every 6-8 hrs prn back pain  60 tablet  0  . oxycodone (ROXICODONE) 30 MG immediate release tablet 1-2 by mouth every 4 jours prn backpain  90 tablet  0  . simvastatin (ZOCOR) 20 MG tablet Take 1 tablet (20 mg total) by mouth daily.  90 tablet  3     Past Medical History  Diagnosis Date  . Renal insufficiency, mild   . OA (osteoarthritis) of knee     right; mod-severe  . DM (diabetes mellitus), type 2     mild mod sensory loss plantar feet  . OA (osteoarthritis)   . History of colonoscopy   . Peripheral autonomic neuropathy due to secondary diabetes   . HTN (hypertension)     labile  . Hypothyroidism     since thyroidectomy  . Anxiety     takes buspar  . Sleep apnea     on cpap    Past Surgical History  Procedure Date  . Total knee arthroplasty 2001    left  . Thyroidectomy, partial     remote  . Cardiolite--no cad ef 34% 01/01/2005  . Joint replacement 2000    left knee   . Lumbar laminectomy/decompression microdiscectomy 09/25/2011  Procedure: LUMBAR LAMINECTOMY/DECOMPRESSION MICRODISCECTOMY;  Surgeon: Karn Cassis;  Location: [facility];  Service: Neurosurgery;  Laterality: Right;  Right Lumbar Five-Sacral One Microdiscectomy    History   Social History  . Marital Status: Married    Spouse Name: N/A    Number of Children: 2  . Years of Education: N/A   Occupational History  . Not on file.   Social History Main Topics  . Smoking status: Former Smoker -- 1.0 packs/day for 28 years    Types: Cigarettes    Quit date: 09/11/2011  . Smokeless tobacco: Never Used  . Alcohol Use: No  . Drug Use: Not on file  . Sexually Active: Not on file   Other Topics Concern  . Not on file   Social History Narrative  . No narrative on file    ROS: back and right lower extremity pain but no fevers or chills, productive cough, hemoptysis, dysphasia, odynophagia, melena, hematochezia, dysuria,  hematuria, rash, seizure activity, orthopnea, PND, pedal edema, claudication. Remaining systems are negative.  Physical Exam: Well-developed well-nourished in no acute distress.  Skin is warm and dry.  HEENT is normal.  Neck is supple. No thyromegaly.  Chest is clear to auscultation with normal expansion.  Cardiovascular exam is regular rate and rhythm.  Abdominal exam nontender or distended. No masses palpated. Extremities show no edema. neuro grossly intact

## 2011-10-17 NOTE — Assessment & Plan Note (Signed)
Blood pressure remains elevated. Increase hydralazine to 50 mg p.o. T.i.d. Check potassium and renal function.

## 2011-10-21 ENCOUNTER — Encounter: Payer: Self-pay | Admitting: Family Medicine

## 2011-10-21 ENCOUNTER — Ambulatory Visit (INDEPENDENT_AMBULATORY_CARE_PROVIDER_SITE_OTHER): Payer: Medicare Other | Admitting: Family Medicine

## 2011-10-21 ENCOUNTER — Ambulatory Visit
Admission: RE | Admit: 2011-10-21 | Discharge: 2011-10-21 | Disposition: A | Discharge status: Home / Self Care | Payer: Medicare Other | Source: Ambulatory Visit | Attending: Family Medicine | Admitting: Family Medicine

## 2011-10-21 ENCOUNTER — Telehealth: Payer: Self-pay | Admitting: Family Medicine

## 2011-10-21 VITALS — BP 137/93 | HR 58 | Temp 98.1°F | Ht 72.0 in | Wt 257.0 lb

## 2011-10-21 DIAGNOSIS — E039 Hypothyroidism, unspecified: Secondary | ICD-10-CM

## 2011-10-21 DIAGNOSIS — R112 Nausea with vomiting, unspecified: Secondary | ICD-10-CM

## 2011-10-21 LAB — COMPREHENSIVE METABOLIC PANEL
ALT: 13 U/L (ref 0–53)
AST: 15 U/L (ref 0–37)
CO2: 28 mEq/L (ref 19–32)
Calcium: 9 mg/dL (ref 8.4–10.5)
Chloride: 95 mEq/L — ABNORMAL LOW (ref 96–112)
Creat: 1.31 mg/dL (ref 0.50–1.35)
Potassium: 3.6 mEq/L (ref 3.5–5.3)
Sodium: 137 mEq/L (ref 135–145)
Total Protein: 6.2 g/dL (ref 6.0–8.3)

## 2011-10-21 LAB — CBC
HCT: 51.8 % (ref 39.0–52.0)
MCHC: 33.8 g/dL (ref 30.0–36.0)
Platelets: 135 10*3/uL — ABNORMAL LOW (ref 150–400)
RDW: 13.9 % (ref 11.5–15.5)
WBC: 5.7 10*3/uL (ref 4.0–10.5)

## 2011-10-21 MED ORDER — ONDANSETRON 4 MG PO TBDP: 4.0000 mg | ORAL_TABLET | Freq: Three times a day (TID) | ORAL | Status: AC | PRN

## 2011-10-21 NOTE — Telephone Encounter (Signed)
Discussed results of xray and that nothing emergent/urgent found.  Gave results to both patient and wife.  Also discussed treatment plan:  1. Zofran for nausea 2. Colace daily  3. OTC suppository until he has at least 2 BM's 4.  Daily or BID Miralax after that so he has at least 1 BM a day.  Discussed this both with patient and his wife.

## 2011-10-21 NOTE — Progress Notes (Signed)
  Subjective:    Patient ID: [name] [name], male    DOB: 04/04/1956, 56 y.o.   MRN: [id number]  HPI 55 year old male with a past medical history significant for recent back surgery in mid November. Since discharge he was having increasing back pain and right leg radiculopathy and called in to [facility] and received prescription for increased pain medication.  Since about that time patient describes increasing nausea and vomiting. He denies having a bowel movement since that time period, however he is unable to quantify exactly when he last had a bowel movement in relation to increased opioid use.  He has lost about 15 pounds since the surgery. He has an abdominal hernia. He denies any pain in this area. He denies any other abdominal pain. Describes vomitus as regurgitated food without hematemesis or coffee-ground emesis. States the past 7 days he's been vomiting once a day after meals. He does have at least one meal a day or he does not vomit the does feel nauseous afterwards. Does report some by mouth fluid intake. Review of Systems See HPI above for review of systems.       Objective:   Physical Exam  Gen:  Obese African American male appears stated age sitting on exam table. Conversant, interactive, nontoxic-appearing. Head: Normocephalic Eyes no scleral injection. Mouth mucous membranes moist Neck: No thyromegaly noted Pulm:  Clear to auscultation bilaterally with good air movement.  No wheezes or rales noted.   Cardiac:  Regular rate and rhythm without murmur auscultated.  Good S1/S2. Abdomen: Soft, obese.  Ventral hernia about 5 cm in size noted. Easily reducible and nontender. Patient does have some mild epigastric tenderness, but this is located about 3-4 cm above site of hernia. No other tenderness throughout the abdomen. No guarding or rebound. Good bowel sounds throughout      Assessment & Plan:

## 2011-10-21 NOTE — Patient Instructions (Signed)
Go get the xray. We'll get some lab work. I'll call you with the results of the xray.  It's possible that it's really bad constipation from the pain medicine. We'll at least start something for nausea and to help go to the bathroom.  We may need to do more based on the X-rays

## 2011-10-21 NOTE — Assessment & Plan Note (Signed)
I suspect this is likely secondary to profound constipation and chronic opioid use as patient states he has not had a bowel movement for at least 2 weeks. However we'll need to rule out anything more serious. I'm sending him over for abdominal x-ray right now. Also plan to obtain CMET and CBC to evaluate for leukocytosis, bicarb level after this amount of vomiting, potassium, and creatinine as well as liver function tests.   Also checking lipase. I will also obtain a TSH since he's had such weight loss and history of hypothyroidism. I told the patient I will call him this afternoon with the results of his x-ray. I discussed with him that at the very least I plan to prescribe him something for what seems to be gastritis (likely from vomiting) found on abdominal exam, stool softener plus laxative for constipation, and Zofran for nausea. Await xray results.

## 2011-10-22 LAB — LIPASE: Lipase: <10 U/L (ref 0–75)

## 2011-10-23 ENCOUNTER — Encounter: Payer: Self-pay | Admitting: Family Medicine

## 2011-10-23 ENCOUNTER — Telehealth: Payer: Self-pay | Admitting: Family Medicine

## 2011-10-23 NOTE — Telephone Encounter (Signed)
Called and spoke with patient.  States he feels "some better."  Took enema first time this AM, had small bowel movement just a few minutes before I called.  Has been sipping on prune juice.  Has had 1 episode of nausea and regurgitation of food yesterday.  Did not take Zofran before eating at that time.  Recommended he drink 2-3 cups prune juice daily or even better would be the Miralax recommended last phone call.  Patient expressed understanding.  Recommended appt next week.  Also discussed that if he acutely worsens or is not feeling better he needs to be seen before then.

## 2011-10-25 NOTE — Telephone Encounter (Signed)
That's great news!  I will forward to PCP Dr. Jennette Kettle as well to keep her informed.

## 2011-10-25 NOTE — Telephone Encounter (Signed)
Patient calling to update MD, says he is feeling much better, his bowels are now moving and pt is able to eat, doesn't think he needs to be seen.

## 2011-10-29 ENCOUNTER — Other Ambulatory Visit: Payer: Self-pay | Admitting: Family Medicine

## 2011-10-29 MED ORDER — OXYCODONE HCL 30 MG PO TABS: ORAL_TABLET | ORAL | Status: DC

## 2011-10-29 NOTE — Telephone Encounter (Signed)
Dear [facility] On your desk Please call him [name] [name]

## 2011-10-29 NOTE — Telephone Encounter (Signed)
Forward to Dr Neal 

## 2011-10-29 NOTE — Telephone Encounter (Signed)
Need refill for oxycodone.  Call patient when ready for pickup

## 2011-10-31 ENCOUNTER — Other Ambulatory Visit (INDEPENDENT_AMBULATORY_CARE_PROVIDER_SITE_OTHER): Payer: Medicare Other | Admitting: *Deleted

## 2011-10-31 DIAGNOSIS — E118 Type 2 diabetes mellitus with unspecified complications: Secondary | ICD-10-CM

## 2011-10-31 DIAGNOSIS — I10 Essential (primary) hypertension: Secondary | ICD-10-CM

## 2011-10-31 LAB — BASIC METABOLIC PANEL
CO2: 28 mEq/L (ref 19–32)
Calcium: 8.6 mg/dL (ref 8.4–10.5)
Creatinine, Ser: 1.2 mg/dL (ref 0.4–1.5)
GFR: 84.12 mL/min (ref >60.00)
Glucose, Bld: 114 mg/dL — ABNORMAL HIGH (ref 70–99)

## 2011-11-07 ENCOUNTER — Ambulatory Visit: Payer: Medicare Other | Attending: Neurosurgery | Admitting: Physical Therapy

## 2011-11-07 DIAGNOSIS — M25559 Pain in unspecified hip: Secondary | ICD-10-CM | POA: Insufficient documentation

## 2011-11-07 DIAGNOSIS — Z96659 Presence of unspecified artificial knee joint: Secondary | ICD-10-CM | POA: Insufficient documentation

## 2011-11-07 DIAGNOSIS — IMO0001 Reserved for inherently not codable concepts without codable children: Secondary | ICD-10-CM | POA: Insufficient documentation

## 2011-11-18 ENCOUNTER — Ambulatory Visit: Payer: Medicare Other | Attending: Neurosurgery | Admitting: Physical Therapy

## 2011-11-18 DIAGNOSIS — IMO0001 Reserved for inherently not codable concepts without codable children: Secondary | ICD-10-CM | POA: Insufficient documentation

## 2011-11-18 DIAGNOSIS — M25559 Pain in unspecified hip: Secondary | ICD-10-CM | POA: Insufficient documentation

## 2011-11-18 DIAGNOSIS — Z96659 Presence of unspecified artificial knee joint: Secondary | ICD-10-CM | POA: Insufficient documentation

## 2011-11-21 ENCOUNTER — Ambulatory Visit (INDEPENDENT_AMBULATORY_CARE_PROVIDER_SITE_OTHER): Payer: Medicare Other | Admitting: Family Medicine

## 2011-11-21 ENCOUNTER — Encounter: Payer: Medicare Other | Admitting: Physical Therapy

## 2011-11-21 VITALS — BP 174/106 | HR 78 | Temp 98.8°F | Ht 72.0 in | Wt 265.0 lb

## 2011-11-21 DIAGNOSIS — M5410 Radiculopathy, site unspecified: Secondary | ICD-10-CM

## 2011-11-21 DIAGNOSIS — IMO0002 Reserved for concepts with insufficient information to code with codable children: Secondary | ICD-10-CM

## 2011-11-22 ENCOUNTER — Encounter: Payer: Medicare Other | Admitting: Physical Therapy

## 2011-11-22 MED ORDER — OXYCODONE HCL 30 MG PO TABS: 30.0000 mg | ORAL_TABLET | Freq: Four times a day (QID) | ORAL | Status: AC | PRN

## 2011-11-22 NOTE — Progress Notes (Signed)
  Subjective:    Patient ID: [name] [name], male    DOB: August 30, 1956, 56 y.o.   MRN: [id number]  HPI Followup low back pain with right leg pain. He continues to have the same type of pain he had prior to his surgery. It is somewhat improved when he takes oxycodone period that lasts about 4 hours. He has started back in physical therapy and says that is actually making his pain worse but his strength seems to be getting a little better. His pain is so bad that he can't sleep more than a couple hours at night. He cannot find a comfortable position either lying down sitting or standing. The pain shoots down the posterior part of his right buttock right thigh into an area just below his knee. Exact same distribution as prior to surgery. Last time he saw his surgeon was 3 weeks ago and they started some gabapentin. That seems to help a little bit but he still on the initial dose.   Review of Systems Denies urinary or fecal incontinence. Denies fever, sweats, chills. Has not had significant unusual weight gain or loss. For additional review of systems please see history of present illness.    Objective:   Physical Exam  GENERAL: Well-developed overweight male who seems to be in a fair amount of discomfort sitting gingerly on the exam table. BACK: Nontender to palpation. There is no tenderness to percussion of the vertebrae in the lower thoracic or lumbar area. There is a lot of spasm on the right paravertebral muscles in the lumbar area. Straight leg raise is positive at 100 on the right. He has intact reflexes at the knee 2+ bilaterally equal. He has intact sensation to soft touch distally on the right and left.      Assessment & Plan:  Low-back pain with radiculopathy status post back surgery. I called the neurosurgeon's office and spoke directly with Dr. Jeral Fruit who agrees to see him in clinic tomorrow. I gave [place] an increased dose of oxycodone. I think we need to get him as comfortable as  possible while they figure out why he's not having some relief of his pain. We will also increase his Neurontin. He will call me after he sees his neurosurgeon and I will see him back in the next 2-3 weeks.

## 2011-11-25 ENCOUNTER — Encounter: Payer: Medicare Other | Admitting: Physical Therapy

## 2011-11-26 ENCOUNTER — Telehealth: Payer: Self-pay | Admitting: Family Medicine

## 2011-11-26 NOTE — Telephone Encounter (Signed)
Message copied by Nestor Ramp on Tue Nov 26, 2011  4:39 PM ------      Message from: Deno Etienne      Created: Tue Nov 26, 2011  9:16 AM      Regarding: pain meds       Dr. Jennette Kettle,            Mr. Seeberger called me yesterday and I forgot to send you the information that he gave me.  He stated that he went to the neurosurgeon? On Friday and they told him that he will more than likely need spinal surgery. He was told that he has some kind of sensor radial(culothaty? Couldn't read the writing). [facility] for 2 weeks so that he doesn't have all of the pressure on the nerve in his spine. He stated that he is still in pain and he (dr.)  Felt it best for him to continue to receive his pain meds from you.  Hope this wasn't too confusing lol!            ~T

## 2011-11-26 NOTE — Telephone Encounter (Signed)
Ok thanks [employer]

## 2011-11-27 ENCOUNTER — Ambulatory Visit (INDEPENDENT_AMBULATORY_CARE_PROVIDER_SITE_OTHER): Payer: Medicare Other | Admitting: Family Medicine

## 2011-11-27 ENCOUNTER — Encounter: Payer: Self-pay | Admitting: Family Medicine

## 2011-11-27 VITALS — BP 175/118 | HR 88 | Temp 97.6°F | Ht 72.0 in | Wt 265.1 lb

## 2011-11-27 DIAGNOSIS — M5410 Radiculopathy, site unspecified: Secondary | ICD-10-CM

## 2011-11-27 DIAGNOSIS — E785 Hyperlipidemia, unspecified: Secondary | ICD-10-CM

## 2011-11-27 DIAGNOSIS — E118 Type 2 diabetes mellitus with unspecified complications: Secondary | ICD-10-CM

## 2011-11-27 DIAGNOSIS — Z23 Encounter for immunization: Secondary | ICD-10-CM

## 2011-11-27 DIAGNOSIS — IMO0002 Reserved for concepts with insufficient information to code with codable children: Secondary | ICD-10-CM

## 2011-11-27 LAB — COMPREHENSIVE METABOLIC PANEL
ALT: 11 U/L (ref 0–53)
AST: 13 U/L (ref 0–37)
Albumin: 3.7 g/dL (ref 3.5–5.2)
CO2: 24 mEq/L (ref 19–32)
Calcium: 9 mg/dL (ref 8.4–10.5)
Chloride: 109 mEq/L (ref 96–112)
Potassium: 4.1 mEq/L (ref 3.5–5.3)

## 2011-11-27 LAB — LIPID PANEL
Cholesterol: 207 mg/dL — ABNORMAL HIGH (ref 0–200)
LDL Cholesterol: 121 mg/dL — ABNORMAL HIGH (ref 0–99)
VLDL: 48 mg/dL — ABNORMAL HIGH (ref 0–40)

## 2011-11-27 MED ORDER — GABAPENTIN 300 MG PO CAPS: ORAL_CAPSULE | ORAL | Status: DC

## 2011-11-27 NOTE — Patient Instructions (Addendum)
See me in one month Please call back and tell me what DOSE of the neurontin (gabapentin) you are taking I would like to continue to taper it up as follows:  Take by tablet by mouth Day 1-3:  One three times a day Day 4-6:  Take one am, one at lunch and  2 at night Day 7-10:   Take two am, one at lunch and  2 at night Day 11-14:   Take two am, two at lunch and  2 at night  Diabetes Latest Ref Rng 11/27/2011 11/27/2011  HbA1c  7.0   Chol 0-200 mg/dL    HDL >16 mg/dL    LDL 1-09 mg/dL    Triglyceride <604 mg/dL    CR 0.4 - 1.5 mg/dL    Wt (Lbs.)   540.9  BMI   35.95  Systolic BP   175  Diastolic BP   118  METFORMIN HCL 500 MG PO TABS     METFORMIN HCL 1000 MG PO TABS     INSULIN GLARGINE 100 UNIT/ML [place] SOLN     GLIPIZIDE ER 10 MG PO TB24      Diabetes Latest Ref Rng 11/21/2011 10/31/2011  HbA1c     Chol 0-200 mg/dL    HDL >81 mg/dL    LDL 1-91 mg/dL    Triglyceride <478 mg/dL    CR 0.4 - 1.5 mg/dL  1.2  Wt (Lbs.)  295   BMI  35.93   Systolic BP  174   Diastolic BP  106   METFORMIN HCL 500 MG PO TABS     METFORMIN HCL 1000 MG PO TABS     INSULIN GLARGINE 100 UNIT/ML [place] SOLN     GLIPIZIDE ER 10 MG PO TB24      Diabetes Latest Ref Rng 10/21/2011  HbA1c    Chol 0-200 mg/dL   HDL >62 mg/dL   LDL 1-30 mg/dL   Triglyceride <865 mg/dL   CR 0.4 - 1.5 mg/dL 7.84  Wt (Lbs.)    BMI    Systolic BP    Diastolic BP    METFORMIN HCL 500 MG PO TABS    METFORMIN HCL 1000 MG PO TABS    INSULIN GLARGINE 100 UNIT/ML [place] SOLN    GLIPIZIDE ER 10 MG PO TB24     Diabetes Latest Ref Rng 10/21/2011  HbA1c    Chol 0-200 mg/dL   HDL >69 mg/dL   LDL 6-29 mg/dL   Triglyceride <528 mg/dL   CR 0.4 - 1.5 mg/dL   Wt (Lbs.)  413  BMI  34.85  Systolic BP  137  Diastolic BP  93  METFORMIN HCL 500 MG PO TABS    METFORMIN HCL 1000 MG PO TABS    INSULIN GLARGINE 100 UNIT/ML  SOLN    GLIPIZIDE ER 10 MG PO TB24

## 2011-11-27 NOTE — Progress Notes (Signed)
  Subjective:    Patient ID: [name] [name], male    DOB: 02-25-1956, 56 y.o.   MRN: [id number]  HPI  Followup recent back surgery. He did see the neurosurgeon. Was diagnosed with a sensory nerve radiculopathy. The neurosurgeon told him to stop physical therapy and that he would not be able to go back to work right away. It may take months. His pain is a little better. When I saw him last it was 20 out of 10. Now it is down to 13 out of 10. He is working with the medication including the gabapentin and trying to find a way to use as little of the pain medicine as possible because he does not want to get constipated again and he does not want to become dependent on the pain meds. Finances are also a huge problem for him right now and affording his medicines is difficult.  Pain starts in his right low back area right next to the spine at about L4. It radiates into the buttock and posterior thigh and to just below the knee. It is sharp and shooting. Worse if he spent a long time on his feet. He said no change in urinary or fecal continence. The pain is making him a little apathetic about eating but he says that if he loses some weight, although better.  Review of Systems No fever. Please see history of present illness above for complete pertinent review of systems.    Objective:   Physical Exam  GENERAL: Well-developed overweight male mild distress. BACK: No tenderness to palpation in the lumbar area. He points to a very specific area where the pain starts and radiates across his buttock. His incision is healing well without any sign of inflammation.      Assessment & Plan:  Continued back pain postoperatively, neurosurgery thinks this is a sensory radiculopathy. Hopefully this is temporary. It sounds like it may take several months to resolve. He did have questions about whether or not a TENS unit would be useful and I will contact physical therapy for that. I will continue his current pain  medication regimen of the oxycodone 30 mg 4 times a day as needed. I have no concern that he is going to use this. I will also taper his gabapentin. I have given him a written taper. I would like to see him back in 3-4 weeks and certainly sooner with problems. #2. Health: His A1c looks great today. We will get fasting lipid profile and CMP

## 2011-11-28 ENCOUNTER — Encounter: Payer: Medicare Other | Admitting: Physical Therapy

## 2011-11-29 ENCOUNTER — Encounter: Payer: Self-pay | Admitting: Family Medicine

## 2011-12-17 ENCOUNTER — Other Ambulatory Visit: Payer: Self-pay | Admitting: Cardiology

## 2011-12-17 MED ORDER — ALLOPURINOL 100 MG PO TABS: 100.0000 mg | ORAL_TABLET | Freq: Every day | ORAL | Status: DC

## 2011-12-17 MED ORDER — DOXAZOSIN MESYLATE 8 MG PO TABS: 8.0000 mg | ORAL_TABLET | Freq: Every day | ORAL | Status: DC

## 2011-12-17 MED ORDER — BENAZEPRIL HCL 40 MG PO TABS: 40.0000 mg | ORAL_TABLET | Freq: Every day | ORAL | Status: DC

## 2011-12-18 ENCOUNTER — Other Ambulatory Visit: Payer: Self-pay | Admitting: Cardiology

## 2011-12-18 MED ORDER — DOXAZOSIN MESYLATE 8 MG PO TABS: 8.0000 mg | ORAL_TABLET | Freq: Every day | ORAL | Status: DC

## 2011-12-18 MED ORDER — AMLODIPINE BESYLATE 10 MG PO TABS: 10.0000 mg | ORAL_TABLET | Freq: Every day | ORAL | Status: DC

## 2011-12-18 MED ORDER — BENAZEPRIL HCL 40 MG PO TABS: 40.0000 mg | ORAL_TABLET | Freq: Every day | ORAL | Status: DC

## 2011-12-18 NOTE — Telephone Encounter (Signed)
Pt said he wants it called to [facility] [address]

## 2011-12-19 ENCOUNTER — Other Ambulatory Visit: Payer: Self-pay | Admitting: Neurosurgery

## 2011-12-19 DIAGNOSIS — M549 Dorsalgia, unspecified: Secondary | ICD-10-CM

## 2011-12-24 ENCOUNTER — Ambulatory Visit
Admission: RE | Admit: 2011-12-24 | Discharge: 2011-12-24 | Disposition: A | Discharge status: Home / Self Care | Payer: Medicare Other | Source: Ambulatory Visit | Attending: Neurosurgery | Admitting: Neurosurgery

## 2011-12-24 DIAGNOSIS — M549 Dorsalgia, unspecified: Secondary | ICD-10-CM

## 2011-12-24 MED ORDER — GADOBENATE DIMEGLUMINE 529 MG/ML IV SOLN
20.0000 mL | Freq: Once | INTRAVENOUS | Status: AC | PRN
  Administered 2011-12-24: 20 mL via INTRAVENOUS

## 2011-12-25 ENCOUNTER — Encounter: Payer: Self-pay | Admitting: Family Medicine

## 2011-12-25 ENCOUNTER — Ambulatory Visit (INDEPENDENT_AMBULATORY_CARE_PROVIDER_SITE_OTHER): Payer: Medicare Other | Admitting: Family Medicine

## 2011-12-25 VITALS — BP 140/92 | HR 72 | Temp 98.4°F | Ht 72.0 in | Wt 259.0 lb

## 2011-12-25 DIAGNOSIS — IMO0002 Reserved for concepts with insufficient information to code with codable children: Secondary | ICD-10-CM

## 2011-12-25 DIAGNOSIS — M5410 Radiculopathy, site unspecified: Secondary | ICD-10-CM

## 2011-12-25 NOTE — Progress Notes (Signed)
  Subjective:    Patient ID: [name] [name], male    DOB: 1956-05-20, 56 y.o.   MRN: [id number]  HPI  Continued back pain with radiculopathy into the right leg. His neurosurgeon sent him for an MRI last night. He has not yet seen the results. He has not yet been able to return to work. He continues to have 9/10 pain she is down his right leg; he denies any urinary or fecal incontinence.  Review of Systems He has had some unintentional weight loss secondary to just generally feeling bad secondary to the pain, but he says this is actually great because he needs to lose some weight anyway.    Objective:   Physical Exam  Vital signs reviewed. GENERAL: Well developed, well nourished, no acute distress   IMAGING: MRI from [place] I reviewed with him. I showed him the recurrence of the disc herniation. Formal reading is as follows:Recurrent right posterolateral disc herniation with a fragment the  right lateral recess that deforms the thecal sac and likely  compresses the right S1 nerve root     Assessment & Plan:  #1. Recurrence of disc herniation. His neurosurgeon ordered the MRI so he expects them to call in in the next day or so.If  He does not hear from him, he'll let me know.  he does not want to have another surgery but realizes it's probably the only way he can get out of his chronic severe pain.

## 2011-12-31 ENCOUNTER — Encounter (HOSPITAL_COMMUNITY): Payer: Self-pay | Admitting: Pharmacy Technician

## 2011-12-31 ENCOUNTER — Other Ambulatory Visit: Payer: Self-pay | Admitting: Neurosurgery

## 2012-01-02 ENCOUNTER — Encounter (HOSPITAL_COMMUNITY): Payer: Self-pay

## 2012-01-02 ENCOUNTER — Encounter (HOSPITAL_COMMUNITY)
Admission: RE | Admit: 2012-01-02 | Discharge: 2012-01-02 | Disposition: A | Discharge status: Home / Self Care | Payer: Medicare Other | Source: Ambulatory Visit | Attending: Neurosurgery | Admitting: Neurosurgery

## 2012-01-02 HISTORY — DX: Adverse effect of unspecified anesthetic, initial encounter: T41.45XA

## 2012-01-02 HISTORY — DX: Other complications of anesthesia, initial encounter: T88.59XA

## 2012-01-02 LAB — CBC
Hemoglobin: 15.5 g/dL (ref 13.0–17.0)
MCHC: 33.8 g/dL (ref 30.0–36.0)
MCV: 83.3 fL (ref 78.0–100.0)
Platelets: 129 10*3/uL — ABNORMAL LOW (ref 150–400)
RBC: 5.5 MIL/uL (ref 4.22–5.81)
RDW: 14 % (ref 11.5–15.5)

## 2012-01-02 LAB — BASIC METABOLIC PANEL
CO2: 28 mEq/L (ref 19–32)
Chloride: 106 mEq/L (ref 96–112)
Glucose, Bld: 130 mg/dL — ABNORMAL HIGH (ref 70–99)
Potassium: 3.7 mEq/L (ref 3.5–5.1)
Sodium: 142 mEq/L (ref 135–145)

## 2012-01-02 MED ORDER — CEFAZOLIN SODIUM-DEXTROSE 2-3 GM-% IV SOLR
2.0000 g | INTRAVENOUS | Status: AC
  Administered 2012-01-03: 2 g via INTRAVENOUS
  Filled 2012-01-02: qty 50

## 2012-01-02 NOTE — Progress Notes (Signed)
Surgery Center reports no record available on this pt.  Spoke with Jaynie Collins, PAC on this pt.

## 2012-01-02 NOTE — Pre-Procedure Instructions (Signed)
20 [name] [name]  01/02/2012   Your procedure is scheduled on:  01/03/2012  Report to [facility] at 1:40p.m. AM.  Call this number if you have problems the morning of surgery: [phone]   Remember:   Do not eat food:After Midnight.  May have clear liquids: up to 4 Hours before arrival.  Clear liquids include soda, tea, black coffee, apple or grape juice, broth.  Take these medicines the morning of surgery with A SIP OF WATER: amlodipine, doxasozin, gabapentin, apresoline, metoprolol,pain medicine as needed    Do not wear jewelry, make-up or nail polish.  Do not wear lotions, powders, or perfumes. You may wear deodorant.  Do not shave 48 hours prior to surgery.  Do not bring valuables to the hospital.  Contacts, dentures or bridgework may not be worn into surgery.  Leave suitcase in the car. After surgery it may be brought to your room.  For patients admitted to the hospital, checkout time is 11:00 AM the day of discharge.   Patients discharged the day of surgery will not be allowed to drive home.  Name and phone number of your driver:  With wife   Special Instructions: CHG Shower Use Special Wash: 1/2 bottle night before surgery and 1/2 bottle morning of surgery.   Please read over the following fact sheets that you were given: Pain Booklet, Coughing and Deep Breathing, MRSA Information and Surgical Site Infection Prevention

## 2012-01-02 NOTE — Progress Notes (Signed)
Call to Surgery Center for on [facility]. For past anesthesia record, pt. Reports that he "flat lined" during surgery.

## 2012-01-03 ENCOUNTER — Inpatient Hospital Stay (HOSPITAL_COMMUNITY): Payer: Medicare Other

## 2012-01-03 ENCOUNTER — Observation Stay (HOSPITAL_COMMUNITY)
Admission: RE | Admit: 2012-01-03 | Discharge: 2012-01-04 | DRG: 491 | Disposition: A | Discharge status: Home / Self Care | Payer: Medicare Other | Source: Ambulatory Visit | Attending: Neurosurgery | Admitting: Neurosurgery

## 2012-01-03 ENCOUNTER — Encounter (HOSPITAL_COMMUNITY)
Admission: RE | Disposition: A | Discharge status: Home / Self Care | Payer: Self-pay | Source: Ambulatory Visit | Attending: Neurosurgery

## 2012-01-03 ENCOUNTER — Inpatient Hospital Stay (HOSPITAL_COMMUNITY): Payer: Medicare Other | Admitting: Anesthesiology

## 2012-01-03 ENCOUNTER — Encounter (HOSPITAL_COMMUNITY): Payer: Self-pay | Admitting: Anesthesiology

## 2012-01-03 ENCOUNTER — Encounter (HOSPITAL_COMMUNITY): Payer: Self-pay | Admitting: Certified Registered"

## 2012-01-03 ENCOUNTER — Encounter (HOSPITAL_COMMUNITY): Payer: Self-pay | Admitting: Vascular Surgery

## 2012-01-03 DIAGNOSIS — E890 Postprocedural hypothyroidism: Secondary | ICD-10-CM | POA: Insufficient documentation

## 2012-01-03 DIAGNOSIS — F411 Generalized anxiety disorder: Secondary | ICD-10-CM | POA: Insufficient documentation

## 2012-01-03 DIAGNOSIS — E1149 Type 2 diabetes mellitus with other diabetic neurological complication: Secondary | ICD-10-CM | POA: Insufficient documentation

## 2012-01-03 DIAGNOSIS — I10 Essential (primary) hypertension: Secondary | ICD-10-CM | POA: Insufficient documentation

## 2012-01-03 DIAGNOSIS — E1142 Type 2 diabetes mellitus with diabetic polyneuropathy: Secondary | ICD-10-CM | POA: Insufficient documentation

## 2012-01-03 DIAGNOSIS — F329 Major depressive disorder, single episode, unspecified: Secondary | ICD-10-CM | POA: Insufficient documentation

## 2012-01-03 DIAGNOSIS — M5410 Radiculopathy, site unspecified: Secondary | ICD-10-CM

## 2012-01-03 DIAGNOSIS — F3289 Other specified depressive episodes: Secondary | ICD-10-CM | POA: Insufficient documentation

## 2012-01-03 DIAGNOSIS — G4733 Obstructive sleep apnea (adult) (pediatric): Secondary | ICD-10-CM | POA: Insufficient documentation

## 2012-01-03 DIAGNOSIS — M5126 Other intervertebral disc displacement, lumbar region: Principal | ICD-10-CM | POA: Insufficient documentation

## 2012-01-03 DIAGNOSIS — Z01812 Encounter for preprocedural laboratory examination: Secondary | ICD-10-CM | POA: Insufficient documentation

## 2012-01-03 HISTORY — PX: LUMBAR LAMINECTOMY/DECOMPRESSION MICRODISCECTOMY: SHX5026

## 2012-01-03 LAB — GLUCOSE, CAPILLARY
Glucose-Capillary: 122 mg/dL — ABNORMAL HIGH (ref 70–99)
Glucose-Capillary: 98 mg/dL (ref 70–99)
Glucose-Capillary: 71 mg/dL (ref 70–99)
Glucose-Capillary: 94 mg/dL (ref 70–99)

## 2012-01-03 SURGERY — LUMBAR LAMINECTOMY/DECOMPRESSION MICRODISCECTOMY
Anesthesia: General | Laterality: Right | Wound class: Clean

## 2012-01-03 MED ORDER — PHENOL 1.4 % MT LIQD: 1.0000 | OROMUCOSAL | Status: DC | PRN

## 2012-01-03 MED ORDER — METHYLPREDNISOLONE ACETATE 80 MG/ML IJ SUSP
INTRAMUSCULAR | Status: DC | PRN
  Administered 2012-01-03: 80 mg

## 2012-01-03 MED ORDER — BUSPIRONE HCL 10 MG PO TABS
10.0000 mg | ORAL_TABLET | Freq: Every morning | ORAL | Status: DC
  Administered 2012-01-04: 10 mg via ORAL
  Filled 2012-01-03: qty 1

## 2012-01-03 MED ORDER — ROCURONIUM BROMIDE 100 MG/10ML IV SOLN
INTRAVENOUS | Status: DC | PRN
  Administered 2012-01-03: 50 mg via INTRAVENOUS

## 2012-01-03 MED ORDER — DOXAZOSIN MESYLATE 8 MG PO TABS
8.0000 mg | ORAL_TABLET | Freq: Every day | ORAL | Status: DC
Start: 2012-01-04 — End: 2012-01-04
  Administered 2012-01-04: 8 mg via ORAL
  Filled 2012-01-03: qty 1

## 2012-01-03 MED ORDER — OXYCODONE HCL 5 MG PO TABS: 5.0000 mg | ORAL_TABLET | Freq: Once | ORAL | Status: DC

## 2012-01-03 MED ORDER — METOCLOPRAMIDE HCL 5 MG/ML IJ SOLN
INTRAMUSCULAR | Status: DC | PRN
  Administered 2012-01-03: 10 mg via INTRAVENOUS

## 2012-01-03 MED ORDER — METFORMIN HCL 500 MG PO TABS
1000.0000 mg | ORAL_TABLET | Freq: Two times a day (BID) | ORAL | Status: DC
  Administered 2012-01-04: 1000 mg via ORAL
  Filled 2012-01-03 (×3): qty 2

## 2012-01-03 MED ORDER — SODIUM CHLORIDE 0.9 % IJ SOLN: 3.0000 mL | Freq: Two times a day (BID) | INTRAMUSCULAR | Status: DC

## 2012-01-03 MED ORDER — LACTATED RINGERS IV SOLN
INTRAVENOUS | Status: DC
  Administered 2012-01-03: 17:00:00 via INTRAVENOUS

## 2012-01-03 MED ORDER — 0.9 % SODIUM CHLORIDE (POUR BTL) OPTIME
TOPICAL | Status: DC | PRN
  Administered 2012-01-03: 1000 mL

## 2012-01-03 MED ORDER — SODIUM CHLORIDE 0.9 % IV SOLN
INTRAVENOUS | Status: DC
  Administered 2012-01-03: 23:00:00 via INTRAVENOUS

## 2012-01-03 MED ORDER — CEFAZOLIN SODIUM 1-5 GM-% IV SOLN: 1.0000 g | Freq: Three times a day (TID) | INTRAVENOUS | Status: DC

## 2012-01-03 MED ORDER — GLYCOPYRROLATE 0.2 MG/ML IJ SOLN
INTRAMUSCULAR | Status: DC | PRN
  Administered 2012-01-03: .7 mg via INTRAVENOUS

## 2012-01-03 MED ORDER — METOCLOPRAMIDE HCL 5 MG/ML IJ SOLN
10.0000 mg | Freq: Once | INTRAMUSCULAR | Status: DC | PRN
  Filled 2012-01-03: qty 2

## 2012-01-03 MED ORDER — HYDRALAZINE HCL 50 MG PO TABS
50.0000 mg | ORAL_TABLET | Freq: Three times a day (TID) | ORAL | Status: DC
  Administered 2012-01-04: 50 mg via ORAL
  Filled 2012-01-03 (×3): qty 1

## 2012-01-03 MED ORDER — FUROSEMIDE 40 MG PO TABS
40.0000 mg | ORAL_TABLET | Freq: Every day | ORAL | Status: DC
  Administered 2012-01-04: 40 mg via ORAL
  Filled 2012-01-03: qty 1

## 2012-01-03 MED ORDER — DOCUSATE SODIUM 100 MG PO CAPS: 100.0000 mg | ORAL_CAPSULE | Freq: Two times a day (BID) | ORAL | Status: DC

## 2012-01-03 MED ORDER — SODIUM CHLORIDE 0.9 % IV SOLN: 250.0000 mL | INTRAVENOUS | Status: DC

## 2012-01-03 MED ORDER — MENTHOL 3 MG MT LOZG: 1.0000 | LOZENGE | OROMUCOSAL | Status: DC | PRN

## 2012-01-03 MED ORDER — HYDROMORPHONE HCL PF 1 MG/ML IJ SOLN
INTRAMUSCULAR | Status: AC
  Filled 2012-01-03: qty 1

## 2012-01-03 MED ORDER — MIDAZOLAM HCL 5 MG/5ML IJ SOLN
INTRAMUSCULAR | Status: DC | PRN
  Administered 2012-01-03: 2 mg via INTRAVENOUS

## 2012-01-03 MED ORDER — GABAPENTIN 300 MG PO CAPS
300.0000 mg | ORAL_CAPSULE | Freq: Two times a day (BID) | ORAL | Status: DC
  Administered 2012-01-04: 300 mg via ORAL
  Filled 2012-01-03 (×2): qty 1

## 2012-01-03 MED ORDER — FENTANYL CITRATE 0.05 MG/ML IJ SOLN
INTRAMUSCULAR | Status: AC
  Filled 2012-01-03: qty 2

## 2012-01-03 MED ORDER — THROMBIN 5000 UNITS EX SOLR
CUTANEOUS | Status: DC | PRN
  Administered 2012-01-03 (×2): 5000 [IU] via TOPICAL

## 2012-01-03 MED ORDER — AMITRIPTYLINE HCL 50 MG PO TABS
50.0000 mg | ORAL_TABLET | Freq: Every day | ORAL | Status: DC
  Administered 2012-01-03: 50 mg via ORAL
  Filled 2012-01-03 (×3): qty 1

## 2012-01-03 MED ORDER — METOPROLOL TARTRATE 100 MG PO TABS
100.0000 mg | ORAL_TABLET | Freq: Two times a day (BID) | ORAL | Status: DC
  Administered 2012-01-04: 100 mg via ORAL
  Filled 2012-01-03 (×2): qty 1

## 2012-01-03 MED ORDER — HEMOSTATIC AGENTS (NO CHARGE) OPTIME
TOPICAL | Status: DC | PRN
  Administered 2012-01-03: 1 via TOPICAL

## 2012-01-03 MED ORDER — METOPROLOL TARTRATE 100 MG PO TABS
100.0000 mg | ORAL_TABLET | Freq: Once | ORAL | Status: AC
  Administered 2012-01-03: 14:00:00 via ORAL
  Administered 2012-01-03: 100 mg via ORAL

## 2012-01-03 MED ORDER — ALLOPURINOL 100 MG PO TABS
100.0000 mg | ORAL_TABLET | Freq: Every day | ORAL | Status: DC
  Administered 2012-01-04: 100 mg via ORAL
  Filled 2012-01-03: qty 1

## 2012-01-03 MED ORDER — OXYCODONE-ACETAMINOPHEN 5-325 MG PO TABS
1.0000 | ORAL_TABLET | ORAL | Status: DC | PRN
  Administered 2012-01-03 – 2012-01-04 (×2): 2 via ORAL
  Administered 2012-01-04: 1 via ORAL
  Administered 2012-01-04: 2 via ORAL
  Filled 2012-01-03 (×3): qty 2

## 2012-01-03 MED ORDER — GLIPIZIDE ER 10 MG PO TB24
10.0000 mg | ORAL_TABLET | Freq: Two times a day (BID) | ORAL | Status: DC
  Administered 2012-01-04: 10 mg via ORAL
  Filled 2012-01-03 (×3): qty 1

## 2012-01-03 MED ORDER — AMLODIPINE BESYLATE 10 MG PO TABS
10.0000 mg | ORAL_TABLET | Freq: Every day | ORAL | Status: DC
Start: 2012-01-04 — End: 2012-01-04
  Administered 2012-01-04: 10 mg via ORAL
  Filled 2012-01-03 (×2): qty 1

## 2012-01-03 MED ORDER — ACETAMINOPHEN 650 MG RE SUPP: 650.0000 mg | RECTAL | Status: DC | PRN

## 2012-01-03 MED ORDER — SUCCINYLCHOLINE CHLORIDE 20 MG/ML IJ SOLN
INTRAMUSCULAR | Status: DC | PRN
  Administered 2012-01-03: 120 mg via INTRAVENOUS

## 2012-01-03 MED ORDER — ONDANSETRON HCL 4 MG/2ML IJ SOLN: 4.0000 mg | INTRAMUSCULAR | Status: DC | PRN

## 2012-01-03 MED ORDER — HYDROMORPHONE HCL PF 1 MG/ML IJ SOLN
INTRAMUSCULAR | Status: AC
  Administered 2012-01-03: 0.5 mg via INTRAVENOUS
  Filled 2012-01-03: qty 1

## 2012-01-03 MED ORDER — OXYCODONE HCL 5 MG PO TABS
ORAL_TABLET | ORAL | Status: AC
  Administered 2012-01-03: 5 mg
  Filled 2012-01-03: qty 1

## 2012-01-03 MED ORDER — CEFAZOLIN SODIUM 1-5 GM-% IV SOLN
1.0000 g | Freq: Three times a day (TID) | INTRAVENOUS | Status: DC
  Administered 2012-01-04: 1 g via INTRAVENOUS
  Filled 2012-01-03 (×2): qty 50

## 2012-01-03 MED ORDER — SODIUM CHLORIDE 0.9 % IJ SOLN: 3.0000 mL | INTRAMUSCULAR | Status: DC | PRN

## 2012-01-03 MED ORDER — MORPHINE SULFATE 4 MG/ML IJ SOLN: 4.0000 mg | INTRAMUSCULAR | Status: DC | PRN

## 2012-01-03 MED ORDER — POTASSIUM CHLORIDE CRYS ER 20 MEQ PO TBCR
20.0000 meq | EXTENDED_RELEASE_TABLET | Freq: Every day | ORAL | Status: DC
  Administered 2012-01-04: 20 meq via ORAL
  Filled 2012-01-03: qty 1

## 2012-01-03 MED ORDER — PROPOFOL 10 MG/ML IV EMUL
INTRAVENOUS | Status: DC | PRN
  Administered 2012-01-03: 50 mg via INTRAVENOUS
  Administered 2012-01-03: 200 mg via INTRAVENOUS

## 2012-01-03 MED ORDER — FENTANYL CITRATE 0.05 MG/ML IJ SOLN
INTRAMUSCULAR | Status: DC | PRN
  Administered 2012-01-03 (×2): 50 ug via INTRAVENOUS
  Administered 2012-01-03: 150 ug via INTRAVENOUS
  Administered 2012-01-03 (×2): 100 ug via INTRAVENOUS

## 2012-01-03 MED ORDER — ACETAMINOPHEN 325 MG PO TABS: 650.0000 mg | ORAL_TABLET | ORAL | Status: DC | PRN

## 2012-01-03 MED ORDER — NEOSTIGMINE METHYLSULFATE 1 MG/ML IJ SOLN
INTRAMUSCULAR | Status: DC | PRN
  Administered 2012-01-03: 5 mg via INTRAVENOUS

## 2012-01-03 MED ORDER — LACTATED RINGERS IV SOLN
INTRAVENOUS | Status: DC | PRN
  Administered 2012-01-03 (×2): via INTRAVENOUS

## 2012-01-03 MED ORDER — ONDANSETRON HCL 4 MG/2ML IJ SOLN
INTRAMUSCULAR | Status: DC | PRN
  Administered 2012-01-03 (×2): 4 mg via INTRAVENOUS

## 2012-01-03 MED ORDER — FENTANYL CITRATE 0.05 MG/ML IJ SOLN
INTRAMUSCULAR | Status: DC | PRN
  Administered 2012-01-03: 100 ug via INTRAVENOUS

## 2012-01-03 MED ORDER — BUPIVACAINE LIPOSOME 1.3 % IJ SUSP
20.0000 mL | INTRAMUSCULAR | Status: AC
  Administered 2012-01-03: 8 mL
  Filled 2012-01-03: qty 20

## 2012-01-03 MED ORDER — BENAZEPRIL HCL 40 MG PO TABS
40.0000 mg | ORAL_TABLET | Freq: Every day | ORAL | Status: DC
  Administered 2012-01-04: 40 mg via ORAL
  Filled 2012-01-03: qty 1

## 2012-01-03 MED ORDER — METOPROLOL TARTRATE 50 MG PO TABS
ORAL_TABLET | ORAL | Status: AC
  Filled 2012-01-03: qty 2

## 2012-01-03 MED ORDER — ZOLPIDEM TARTRATE 10 MG PO TABS: 10.0000 mg | ORAL_TABLET | Freq: Every evening | ORAL | Status: DC | PRN

## 2012-01-03 MED ORDER — OXYCODONE-ACETAMINOPHEN 5-325 MG PO TABS
ORAL_TABLET | ORAL | Status: AC
  Filled 2012-01-03: qty 2

## 2012-01-03 MED ORDER — HYDROMORPHONE HCL PF 1 MG/ML IJ SOLN
0.2500 mg | INTRAMUSCULAR | Status: DC | PRN
  Administered 2012-01-03 (×3): 0.5 mg via INTRAVENOUS

## 2012-01-03 SURGICAL SUPPLY — 57 items
BENZOIN TINCTURE PRP APPL 2/3 (GAUZE/BANDAGES/DRESSINGS) ×2 IMPLANT
BLADE SURG ROTATE 9660 (MISCELLANEOUS) IMPLANT
BUR ACORN 6.0 (BURR) ×2 IMPLANT
BUR MATCHSTICK NEURO 3.0 LAGG (BURR) ×2 IMPLANT
CANISTER SUCTION 2500CC (MISCELLANEOUS) ×2 IMPLANT
CLOSURE STERI STRIP 1/2 X4 (GAUZE/BANDAGES/DRESSINGS) ×2 IMPLANT
CLOTH BEACON ORANGE TIMEOUT ST (SAFETY) ×2 IMPLANT
CONT SPEC 4OZ CLIKSEAL STRL BL (MISCELLANEOUS) ×2 IMPLANT
DRAPE LAPAROTOMY 100X72X124 (DRAPES) ×2 IMPLANT
DRAPE MICROSCOPE LEICA (MISCELLANEOUS) IMPLANT
DRAPE MICROSCOPE ZEISS OPMI (DRAPES) ×2 IMPLANT
DRAPE POUCH INSTRU U-SHP 10X18 (DRAPES) ×2 IMPLANT
DRSG EMULSION OIL 3X3 NADH (GAUZE/BANDAGES/DRESSINGS) ×2 IMPLANT
DRSG PAD ABDOMINAL 8X10 ST (GAUZE/BANDAGES/DRESSINGS) ×2 IMPLANT
DURAPREP 26ML APPLICATOR (WOUND CARE) ×2 IMPLANT
ELECT BLADE 4.0 EZ CLEAN MEGAD (MISCELLANEOUS) ×2
ELECT REM PT RETURN 9FT ADLT (ELECTROSURGICAL) ×2
ELECTRODE BLDE 4.0 EZ CLN MEGD (MISCELLANEOUS) ×1 IMPLANT
ELECTRODE REM PT RTRN 9FT ADLT (ELECTROSURGICAL) ×1 IMPLANT
GAUZE SPONGE 4X4 16PLY XRAY LF (GAUZE/BANDAGES/DRESSINGS) IMPLANT
GLOVE BIO SURGEON STRL SZ 6.5 (GLOVE) ×4 IMPLANT
GLOVE BIO SURGEON STRL SZ7 (GLOVE) ×2 IMPLANT
GLOVE BIOGEL M 8.0 STRL (GLOVE) ×2 IMPLANT
GLOVE EXAM NITRILE LRG STRL (GLOVE) IMPLANT
GLOVE EXAM NITRILE MD LF STRL (GLOVE) IMPLANT
GLOVE EXAM NITRILE XL STR (GLOVE) IMPLANT
GLOVE EXAM NITRILE XS STR PU (GLOVE) IMPLANT
GOWN BRE IMP SLV AUR LG STRL (GOWN DISPOSABLE) ×4 IMPLANT
GOWN BRE IMP SLV AUR XL STRL (GOWN DISPOSABLE) IMPLANT
GOWN STRL REIN 2XL LVL4 (GOWN DISPOSABLE) IMPLANT
KIT BASIN OR (CUSTOM PROCEDURE TRAY) ×2 IMPLANT
KIT ROOM TURNOVER OR (KITS) ×2 IMPLANT
NEEDLE HYPO 18GX1.5 BLUNT FILL (NEEDLE) IMPLANT
NEEDLE HYPO 21X1.5 SAFETY (NEEDLE) IMPLANT
NEEDLE HYPO 25X1 1.5 SAFETY (NEEDLE) ×2 IMPLANT
NEEDLE SPNL 20GX3.5 QUINCKE YW (NEEDLE) ×2 IMPLANT
NS IRRIG 1000ML POUR BTL (IV SOLUTION) ×2 IMPLANT
PACK LAMINECTOMY NEURO (CUSTOM PROCEDURE TRAY) ×2 IMPLANT
PAD ARMBOARD 7.5X6 YLW CONV (MISCELLANEOUS) ×6 IMPLANT
PATTIES SURGICAL .5 X1 (DISPOSABLE) ×2 IMPLANT
RUBBERBAND STERILE (MISCELLANEOUS) ×4 IMPLANT
SPONGE GAUZE 4X4 12PLY (GAUZE/BANDAGES/DRESSINGS) ×2 IMPLANT
SPONGE LAP 4X18 X RAY DECT (DISPOSABLE) IMPLANT
SPONGE SURGIFOAM ABS GEL SZ50 (HEMOSTASIS) ×2 IMPLANT
STRIP CLOSURE SKIN 1/2X4 (GAUZE/BANDAGES/DRESSINGS) ×2 IMPLANT
SUT VIC AB 0 CT1 18XCR BRD8 (SUTURE) ×1 IMPLANT
SUT VIC AB 0 CT1 8-18 (SUTURE) ×1
SUT VIC AB 2-0 CP2 18 (SUTURE) ×2 IMPLANT
SUT VIC AB 3-0 SH 8-18 (SUTURE) ×2 IMPLANT
SYR 20CC LL (SYRINGE) IMPLANT
SYR 20ML ECCENTRIC (SYRINGE) ×2 IMPLANT
SYR 5ML LL (SYRINGE) IMPLANT
SYR CONTROL 10ML LL (SYRINGE) ×2 IMPLANT
TAPE CLOTH SURG 4X10 WHT LF (GAUZE/BANDAGES/DRESSINGS) ×2 IMPLANT
TOWEL OR 17X24 6PK STRL BLUE (TOWEL DISPOSABLE) ×2 IMPLANT
TOWEL OR 17X26 10 PK STRL BLUE (TOWEL DISPOSABLE) ×2 IMPLANT
WATER STERILE IRR 1000ML POUR (IV SOLUTION) ×2 IMPLANT

## 2012-01-03 NOTE — Preoperative (Signed)
Beta Blockers   Reason not to administer Beta Blockers:Not Applicable 

## 2012-01-03 NOTE — Anesthesia Preprocedure Evaluation (Addendum)
Anesthesia Evaluation  Patient identified by MRN, date of birth, ID band Patient awake    Reviewed: Allergy & Precautions, H&P , NPO status , Patient's Chart, lab work & pertinent test results, reviewed documented beta blocker date and time   History of Anesthesia Complications (+) PONV  Airway Mallampati: II TM Distance: <3 FB Neck ROM: Full    Dental  (+) Edentulous Upper   Pulmonary sleep apnea and Continuous Positive Airway Pressure Ventilation ,  clear to auscultation        Cardiovascular hypertension, Pt. on medications, On Home Beta Blockers and On Medications + CAD Regular Normal    Neuro/Psych PSYCHIATRIC DISORDERS Anxiety Depression  Neuromuscular disease    GI/Hepatic negative GI ROS, Neg liver ROS,   Endo/Other  Diabetes mellitus-, Well Controlled, Type 1, Oral Hypoglycemic AgentsHypothyroidism   Renal/GU negative Renal ROS  Genitourinary negative   Musculoskeletal   Abdominal (+)  Abdomen: soft.    Peds  Hematology negative hematology ROS (+)   Anesthesia Other Findings See surgeon's H&P   EF in 11/12 greater than 50%  Reproductive/Obstetrics negative OB ROS                         Anesthesia Physical Anesthesia Plan  ASA: III  Anesthesia Plan: General   Post-op Pain Management:    Induction: Intravenous  Airway Management Planned: Oral ETT and Video Laryngoscope Planned  Additional Equipment:   Intra-op Plan:   Post-operative Plan: Extubation in OR  Informed Consent: I have reviewed the patients History and Physical, chart, labs and discussed the procedure including the risks, benefits and alternatives for the proposed anesthesia with the patient or authorized representative who has indicated his/her understanding and acceptance.     Plan Discussed with: CRNA and Surgeon  Anesthesia Plan Comments:         Anesthesia Quick Evaluation

## 2012-01-03 NOTE — Anesthesia Postprocedure Evaluation (Signed)
Anesthesia Post Note  Patient: [name] [name]  Procedure(s) Performed: Procedure(s) (LRB): LUMBAR LAMINECTOMY/DECOMPRESSION MICRODISCECTOMY (Right)  Anesthesia type: general  Patient location: PACU  Post pain: Pain level controlled  Post assessment: Patient's Cardiovascular Status Stable  Last Vitals:  Filed Vitals:   01/03/12 2240  BP: 160/98  Pulse: 65  Temp: 36.7 C  Resp: 14    Post vital signs: Reviewed and stable  Level of consciousness: sedated  Complications: No apparent anesthesia complications

## 2012-01-03 NOTE — Anesthesia Procedure Notes (Signed)
Procedure Name: Intubation Date/Time: 01/03/2012 7:10 PM Performed by: Ellin Goodie Pre-anesthesia Checklist: Patient identified, Emergency Drugs available, Suction available, Patient being monitored and Timeout performed Patient Re-evaluated:Patient Re-evaluated prior to inductionOxygen Delivery Method: Circle system utilized Preoxygenation: Pre-oxygenation with 100% oxygen Intubation Type: IV induction Ventilation: Mask ventilation with difficulty and Oral airway inserted - appropriate to patient size Laryngoscope Size: Mac and 3 Grade View: Grade I Tube type: Oral Tube size: 7.5 mm Number of attempts: 1 Airway Equipment and Method: Stylet and Video-laryngoscopy Placement Confirmation: ETT inserted through vocal cords under direct vision,  positive ETCO2 and breath sounds checked- equal and bilateral Secured at: 23 cm Tube secured with: Tape Dental Injury: Teeth and Oropharynx as per pre-operative assessment

## 2012-01-03 NOTE — Transfer of Care (Signed)
Immediate Anesthesia Transfer of Care Note  Patient: [name] [name]  Procedure(s) Performed: Procedure(s) (LRB): LUMBAR LAMINECTOMY/DECOMPRESSION MICRODISCECTOMY (Right)  Patient Location: PACU  Anesthesia Type: General  Level of Consciousness: awake  Airway & Oxygen Therapy: Patient Spontanous Breathing  Post-op Assessment: Report given to PACU RN  Post vital signs: stable  Complications: No apparent anesthesia complications

## 2012-01-03 NOTE — Progress Notes (Signed)
During PAT visit, clearly instructed pt to take BP meds to include metoprolol this a.m.  before arrival to hosp.  He reports that he slept late & didn't take any meds. Except pain med.

## 2012-01-03 NOTE — OR Nursing (Signed)
Pt has shellfish allergy, but states that Betadine/Iodine is fine

## 2012-01-03 NOTE — Progress Notes (Signed)
Call to Toy Baker., verified documentation of Metoprolol

## 2012-01-03 NOTE — H&P (Signed)
[name] [name] is an 56 y.o. male.   Chief Complaint:  lbp and right leg pain. HPI: patient had a right l5s1 discectomy several weeks ago.came to the office0n1/31/13 c/o back and right leg pain. No better with conservative treatment.repeat mri showed recurrence herniated disc with ddd.   Past Medical History  Diagnosis Date  . Renal insufficiency, mild   . OA (osteoarthritis) of knee     right; mod-severe  . DM (diabetes mellitus), type 2     mild mod sensory loss plantar feet  . OA (osteoarthritis)   . History of colonoscopy   . Peripheral autonomic neuropathy due to secondary diabetes   . Hypothyroidism     since thyroidectomy  . Anxiety     takes buspar  . Sleep apnea     on cpap  . Complication of anesthesia     pt. reports he "flat lined " during surgery in  1999, told that  he had to be revived .  Pt. reports that it was surg. to replace his knee [facility] [place]  [facility]- no record avail. on this pt.   Marland Kitchen HTN (hypertension)     labile, followed by Dr. Jens [name]    Past Surgical History  Procedure Date  . Total knee arthroplasty 2001    left  . Thyroidectomy, partial     remote  . Cardiolite--no cad ef 34% 01/01/2005  . Joint replacement 2000    left knee   . Lumbar laminectomy/decompression microdiscectomy 09/25/2011    Procedure: LUMBAR LAMINECTOMY/DECOMPRESSION MICRODISCECTOMY;  Surgeon: [name] [name];  Location: [facility];  Service: Neurosurgery;  Laterality: Right;  Right Lumbar Five-Sacral One Microdiscectomy  . Back surgery     11/12    Family History  Problem Relation Age of Onset  . Diabetes Mother   . Hypertension Mother   . Aneurysm Mother   . Anesthesia problems Neg Hx   . Hypotension Neg Hx   . Malignant hyperthermia Neg Hx   . Pseudochol deficiency Neg Hx    Social History:  reports that he has been smoking Cigarettes.  He has a 28 pack-year smoking history. He has never used smokeless tobacco.  He reports that he does not drink alcohol or use illicit drugs.  Allergies:  Allergies  Allergen Reactions  . Shellfish Allergy Other (See Comments)    Reaction unknown    Medications Prior to Admission  Medication Dose Route Frequency Provider Last Rate Last Dose  . ceFAZolin (ANCEF) IVPB 2 g/50 mL premix  2 g Intravenous 60 min Pre-Op [name] Cassis, MD      . metoprolol (LOPRESSOR) tablet 100 mg  100 mg Oral Once [name] Nora, MD       Medications Prior to Admission  Medication Sig Dispense Refill  . allopurinol (ZYLOPRIM) 100 MG tablet Take 100 mg by mouth daily.      Marland Kitchen amLODipine (NORVASC) 10 MG tablet Take 10 mg by mouth every morning.       . benazepril (LOTENSIN) 40 MG tablet Take 40 mg by mouth daily.      Marland Kitchen doxazosin (CARDURA) 8 MG tablet Take 8 mg by mouth daily.      . furosemide (LASIX) 80 MG tablet Take 40-80 mg by mouth daily as needed. for lower extremity edema      . gabapentin (NEURONTIN) 300 MG capsule Take 300 mg by mouth 2 (two) times daily.      Marland Kitchen  glipiZIDE (GLUCOTROL XL) 10 MG 24 hr tablet Take 10 mg by mouth 2 (two) times daily.      . hydrALAZINE (APRESOLINE) 50 MG tablet Take 50 mg by mouth 3 (three) times daily.      . metFORMIN (GLUCOPHAGE) 1000 MG tablet Take 1 tablet (1,000 mg total) by mouth 2 (two) times daily.  180 tablet  3  . metoprolol (LOPRESSOR) 100 MG tablet Take 1 tablet (100 mg total) by mouth 2 (two) times daily.  180 tablet  3  . oxycodone (ROXICODONE) 30 MG immediate release tablet Take 15-30 mg by mouth every 4 (four) hours as needed. For back pain      . simvastatin (ZOCOR) 20 MG tablet Take 1 tablet (20 mg total) by mouth daily.  90 tablet  3    Results for orders placed during the hospital encounter of 01/03/12 (from the past 48 hour(s))  GLUCOSE, CAPILLARY     Status: Abnormal   Collection Time   01/03/12  1:58 PM      Component Value Range Comment   Glucose-Capillary 122 (*) 70 - 99 (mg/dL)    No results found.  Review of  Systems  Constitutional: Negative.   HENT: Negative.   Eyes: Negative.   Respiratory: Positive for shortness of breath.   Cardiovascular: Negative.   Gastrointestinal: Negative.   Genitourinary: Negative.   Musculoskeletal: Positive for back pain.  Skin: Negative.   Neurological: Positive for focal weakness.  Endo/Heme/Allergies:       Dm  Psychiatric/Behavioral: Positive for depression.    Blood pressure 161/94, pulse 82, temperature 97.4 F (36.3 C), temperature source Oral, resp. rate 20, SpO2 92.00%. Physical Exam hent,nl. Neck,nl. Cv, nl. Lungs, respiratory ronchii. Abdomen, negative. Extremities grade 1 edema.NEURO, slr positive right. Burning sensation right foot. Mri,showed recorrence disc at right l5s1 space. Bilateral foraminal narrow right worse than left..  Assessment/Plan Patient to have discectomy at right l5s1. He is aware of risks including the need of fusion.   [name] [name] M 01/03/2012, 5:13 PM

## 2012-01-03 NOTE — Progress Notes (Signed)
Operative report [id number]

## 2012-01-03 NOTE — Progress Notes (Signed)
Right l5 s1 discectomy with microscope was done. i did talk to wife

## 2012-01-04 LAB — GLUCOSE, CAPILLARY: Glucose-Capillary: 103 mg/dL — ABNORMAL HIGH (ref 70–99)

## 2012-01-04 NOTE — Progress Notes (Signed)
Order received, chart reviewed, pt in hallway walking with PT. Per pt and PT pt with grab bar able to get up/down from toilet with grab bar and has a 3-n-1 at home. Pt has a walk in shower and if there is any issue he can use the 3-n-1 in there. Pt sat on mat table in gym and able to cross one leg over the other to get to socks. Went over with pt how it would make it easier for toilet hygiene with wet wipes and if standing can bring LUE around to right shoulder and this makes reach of RUE for hygiene go a little father without twisting. Other than these educational areas, no other needs. Time spent with pt 3 minutes so no charge. Acute OT signing off and pt to D/C home today.  Ignacia Palma, [place] [phone] 01/04/2012

## 2012-01-04 NOTE — Discharge Summary (Signed)
  Surgery one day for recurrent HNP, home the next morning. Leg pain markedly improved.Specific instructions given.

## 2012-01-04 NOTE — Op Note (Signed)
NAMELAFE, CLERK               ACCOUNT NO.:  [id number]  MEDICAL RECORD NO.:  [id number]  LOCATION:  3008                         FACILITY:  [facility]  PHYSICIAN:  Hilda Lias, M.D.   DATE OF BIRTH:  [birth year]  DATE OF PROCEDURE:  01/03/2012 DATE OF DISCHARGE:                              OPERATIVE REPORT   PREOPERATIVE DIAGNOSES:  Right L5/recurrent herniated disk.  Diabetes mellitus.  Obesity.  POSTOPERATIVE DIAGNOSES:  Right L5/recurrent herniated disk.  Diabetes mellitus.  Obesity.  PROCEDURE:  Right L5-S1 diskectomy.  Lysis of adhesion, removal of large fragment of disk at L5-S1 on the right side.  Foraminotomy.  Microscope.  SURGEON:  Hilda Lias, MD  CLINICAL HISTORY:  Mr. Hyndman came to hospital several weeks ago underwent diskectomy L5-S1.  Nevertheless, the patient continued to have pain which has gotten worse.  We did MRI which showed recurrent herniated disk at L5-S1.  Surgery was advised.  I spoke to him and his wife, and we talked about the risk of the surgery which might need to fevers and proceed with pain down to the right leg with no change in size.  PROCEDURE:  The patient was taken to the OR and after intubation he was positioned in a prone manner.  The skin was cleaned with DuraPrep. Drapes were applied.  A midline incision following the previous one was made through the skin through a thick adipose tissue straight down to the lumbar area.  X-rays showed that we were at L5-S1.  The patient had quite a bit of fibrosis.  It was difficult to see the lamina of L5-L6. Dissection was carried out and there was a drill, lamina was excised, at the level of L5 above the S1.  The patient has quite a bit of adhesion. Lysis was accomplished.  At the end, we were able to get laparoscopy at S1 level.  We entered the disk space, and we removed 2 large fragments of disk. Then a foraminotomy was accomplished.  At the end, we had a good decompression of the thecal  sac at the L5 and S1 level.  The wound was irrigated.  Valsalva maneuver was negative.  Fentanyl and Depo- Medrol were left in the epidural space and the wound was closed with Vicryl and Steri-Strips.  The patient was sent to PACU.  At the end of the procedure, I talked to his wife.          ______________________________ Hilda Lias, M.D.     EB/MEDQ  D:  01/03/2012  T:  01/04/2012  Job:  [id number]

## 2012-01-04 NOTE — Progress Notes (Signed)
Discussed importance of quitting smoking. SaO2 83% aftrer walking on room air. Used IS to deeply inhale. After 10 good deep breaths SaO2 increased to 94%. Enc. IS q 1 hr at home and no more smoking. Discussed s/sx COPD

## 2012-01-04 NOTE — Progress Notes (Signed)
Discharged to home. Transported to car via wheelchair.

## 2012-01-04 NOTE — Evaluation (Signed)
Physical Therapy Evaluation Patient Details Name: [name] [name] MRN: [id number] DOB: 03-Feb-1956 Today's Date: 01/04/2012  Problem List:  Patient Active Problem List  Diagnoses  . Unspecified hypothyroidism  . DIABETES MELLITUS, CONTROLLED, WITH COMPLICATIONS  . HYPERLIPIDEMIA  . GOUT, ACUTE  . OBESITY, NOS  . TOBACCO DEPENDENCE  . DEPRESSIVE DISORDER, NOS  . CORONARY, ARTERIOSCLEROSIS  . CHF - EJECTION FRACTION < 50%  . OSTEOARTHRITIS  . APNEA, SLEEP  . HYPERTENSION, BENIGN SYSTEMIC  . Renal insufficiency  . Radicular low back pain    Past Medical History:  Past Medical History  Diagnosis Date  . Renal insufficiency, mild   . OA (osteoarthritis) of knee     right; mod-severe  . DM (diabetes mellitus), type 2     mild mod sensory loss plantar feet  . OA (osteoarthritis)   . History of colonoscopy   . Peripheral autonomic neuropathy due to secondary diabetes   . Hypothyroidism     since thyroidectomy  . Anxiety     takes buspar  . Sleep apnea     on cpap  . Complication of anesthesia     pt. reports he "flat lined " during surgery in  1999, told that  he had to be revived .  Pt. reports that it was surg. to replace his knee & [facility] [place].  [facility] to [facility]- no record avail. on this pt.   Marland Kitchen HTN (hypertension)     labile, followed by Dr. Jens Som   Past Surgical History:  Past Surgical History  Procedure Date  . Total knee arthroplasty 2001    left  . Thyroidectomy, partial     remote  . Cardiolite--no cad ef 34% 01/01/2005  . Joint replacement 2000    left knee   . Lumbar laminectomy/decompression microdiscectomy 09/25/2011    Procedure: LUMBAR LAMINECTOMY/DECOMPRESSION MICRODISCECTOMY;  Surgeon: Karn Cassis;  Location: [facility];  Service: Neurosurgery;  Laterality: Right;  Right Lumbar Five-Sacral One Microdiscectomy  . Back surgery     11/12    PT Assessment/Plan/Recommendation PT Assessment Clinical  Impression Statement: 56 year old s/p lumbar laminectomy. Pt presents with great mobility however still needs supervision for stairs and for long distances. Educated wife on need for 3n1 over toilet, need for supervision initially. Wife verbalizing understanding. Post ambulation pt's SpO2 taken, found to be 80-83% on room air, RN called to room. Pt able to increase SPO2 with cues for breathing.  PT Recommendation/Assessment: Patent does not need any further PT services No Skilled PT: Patient will have necessary level of assist by caregiver at discharge PT Recommendation Follow Up Recommendations: No PT follow up;Supervision for mobility/OOB Equipment Recommended: None recommended by PT PT Goals   Signing off  PT Evaluation Precautions/Restrictions  Precautions Precautions: Back Precaution Booklet Issued: Yes (comment) (Given back handout) Required Braces or Orthoses: No Restrictions Weight Bearing Restrictions: No Prior Functioning  Home Living Lives With: Spouse Type of Home: House Home Layout: One level Home Access: Stairs to enter Entrance Stairs-Rails: None Entrance Stairs-Number of Steps: 2 Bathroom Shower/Tub: Engineer, manufacturing systems: Standard Home Adaptive Equipment: Bedside commode/3-in-1 Prior Function Level of Independence: Independent with basic ADLs;Independent with homemaking with ambulation;Independent with gait;Independent with transfers Driving: Yes Cognition  WFL Sensation/Coordination Sensation Light Touch: Appears Intact (bil. LEs) Coordination Gross Motor Movements are Fluid and Coordinated: Yes Fine Motor Movements are Fluid and Coordinated: Yes Extremity Assessment RLE Assessment RLE Assessment: Within Functional Limits LLE Assessment  LLE Assessment: Within Functional Limits Mobility (including Balance) Bed Mobility Bed Mobility: No (seated at start, verbally reviewed log roll technique) Transfers Transfers: Yes Sit to Stand: 6: Modified  independent (Device/Increase time);5: Supervision;From toilet;From chair/3-in-1 Sit to Stand Details (indicate cue type and reason): Supervision from toilet, requires rail from toilet. Discussed need to use 3n1 over toilet when at home.  Stand to Sit: 5: Supervision;6: Modified independent (Device/Increase time) Stand to Sit Details: Supervision to toilet, must use rail. Modified independent to chair. Verbal cues for adherence to back precautions during transfers Ambulation/Gait Ambulation/Gait: Yes Ambulation/Gait Assistance: 5: Supervision Ambulation/Gait Assistance Details (indicate cue type and reason): Supervision initially progressing to modified independent. No overt losses of balance.  Ambulation Distance (Feet): 130 Feet Assistive device: None Gait Pattern: Step-through pattern;Decreased stride length (decreased foot clearance.) Stairs: Yes Stairs Assistance: 5: Supervision Stairs Assistance Details (indicate cue type and reason): Close supervision for safety, pt advised to have wife with him at all times when doing steps Stair Management Technique: No rails Number of Stairs: 2   Posture/Postural Control Posture/Postural Control: No significant limitations Balance Balance Assessed: Yes High Level Balance High Level Balance Activites: Direction changes;Sudden stops;Head turns High Level Balance Comments: Pt able to self correct minor losses of balance (very minor) End of Session PT - End of Session Equipment Utilized During Treatment: Gait belt Activity Tolerance: Patient tolerated treatment well Patient left: in bed Nurse Communication: Mobility status for transfers;Mobility status for ambulation;Other (comment) (low SpO2 post ambulation) General Behavior During Session: [facility] for tasks performed Cognition: [facility] for tasks performed  Sherie Don 01/04/2012, 1:31 PM  Sherie Don) Carleene Mains PT, DPT Acute Rehabilitation [phone]

## 2012-01-09 ENCOUNTER — Encounter (HOSPITAL_COMMUNITY): Payer: Self-pay | Admitting: Neurosurgery

## 2012-01-20 ENCOUNTER — Telehealth: Payer: Self-pay | Admitting: *Deleted

## 2012-01-20 MED ORDER — HYDROCODONE-ACETAMINOPHEN 5-325 MG PO TABS: 2.0000 | ORAL_TABLET | Freq: Three times a day (TID) | ORAL | Status: AC | PRN

## 2012-01-20 NOTE — Telephone Encounter (Signed)
Ok to substitute this for his oxycodone--do not take both Thanks Please call in [name] [name]

## 2012-01-20 NOTE — Telephone Encounter (Signed)
Called in pt's Rx to [address] [name] [name]) rx to be ready in about 2-3 hours.Loralee Pacas [place] and spoke to pt's wife to tell pt that he cannot take these two meds at the same time.Loralee Pacas [place]

## 2012-01-20 NOTE — Telephone Encounter (Signed)
Pt called and is asking for an Rx for Vicodin to be sent to [facility] [address]. Pt stated that he had taken 2 of his wife's pills and is getting relief from these for now and he is not having the side effects of the oxycodone. i told him that I would forward this to Dr. Jennette Kettle for advise.Loralee Pacas [place]

## 2012-01-21 ENCOUNTER — Telehealth: Payer: Self-pay | Admitting: Family Medicine

## 2012-01-21 NOTE — Telephone Encounter (Signed)
Requesting rx refill for Metformin 1000 mg .

## 2012-01-22 MED ORDER — METFORMIN HCL 1000 MG PO TABS: 1000.0000 mg | ORAL_TABLET | Freq: Two times a day (BID) | ORAL | Status: DC

## 2012-01-29 ENCOUNTER — Other Ambulatory Visit: Payer: Self-pay | Admitting: Family Medicine

## 2012-01-29 MED ORDER — METFORMIN HCL 1000 MG PO TABS: 1000.0000 mg | ORAL_TABLET | Freq: Two times a day (BID) | ORAL | Status: DC

## 2012-02-13 ENCOUNTER — Other Ambulatory Visit: Payer: Self-pay | Admitting: Family Medicine

## 2012-02-13 MED ORDER — OXYCODONE HCL 30 MG PO TABS: 30.0000 mg | ORAL_TABLET | Freq: Three times a day (TID) | ORAL | Status: DC | PRN

## 2012-02-19 ENCOUNTER — Encounter: Payer: Self-pay | Admitting: Family Medicine

## 2012-02-19 ENCOUNTER — Ambulatory Visit (INDEPENDENT_AMBULATORY_CARE_PROVIDER_SITE_OTHER): Payer: Medicare Other | Admitting: Family Medicine

## 2012-02-19 VITALS — BP 140/92 | HR 68 | Temp 97.6°F | Ht 70.5 in | Wt 259.4 lb

## 2012-02-19 DIAGNOSIS — M5410 Radiculopathy, site unspecified: Secondary | ICD-10-CM

## 2012-02-19 DIAGNOSIS — IMO0002 Reserved for concepts with insufficient information to code with codable children: Secondary | ICD-10-CM

## 2012-02-19 DIAGNOSIS — E118 Type 2 diabetes mellitus with unspecified complications: Secondary | ICD-10-CM

## 2012-02-19 DIAGNOSIS — I10 Essential (primary) hypertension: Secondary | ICD-10-CM

## 2012-02-19 DIAGNOSIS — E785 Hyperlipidemia, unspecified: Secondary | ICD-10-CM

## 2012-02-19 LAB — COMPREHENSIVE METABOLIC PANEL
Albumin: 3.9 g/dL (ref 3.5–5.2)
Alkaline Phosphatase: 76 U/L (ref 39–117)
BUN: 14 mg/dL (ref 6–23)
Creat: 1.32 mg/dL (ref 0.50–1.35)
Glucose, Bld: 118 mg/dL — ABNORMAL HIGH (ref 70–99)
Total Bilirubin: 0.4 mg/dL (ref 0.3–1.2)

## 2012-02-19 MED ORDER — OXYCODONE HCL 30 MG PO TABS: ORAL_TABLET | ORAL | Status: DC

## 2012-02-19 MED ORDER — OXYCODONE HCL 30 MG PO TABS: 30.0000 mg | ORAL_TABLET | Freq: Three times a day (TID) | ORAL | Status: DC | PRN

## 2012-02-19 NOTE — Patient Instructions (Addendum)
STOP the meloxicam STOP the glipizide Let's plan on two more months of oxycodone---I think that will get you over the hump. I would like to see you in two months---PLEASE call me if there are issues or you have questions sooner Great to see you--let's hope this was your last SURGERY for a while.  The name of the web site for support hose that I think would be best for you is [employer].com.br. Order medium compression by shoe size.

## 2012-02-20 ENCOUNTER — Encounter: Payer: Self-pay | Admitting: Family Medicine

## 2012-02-21 ENCOUNTER — Telehealth: Payer: Self-pay | Admitting: Family Medicine

## 2012-02-21 ENCOUNTER — Encounter: Payer: Self-pay | Admitting: Family Medicine

## 2012-02-21 NOTE — Progress Notes (Signed)
  Subjective:    Patient ID: [name] [name], male    DOB: [birth year], 56 y.o.   MRN: [id number]  HPI  #1 pre-followup diabetes mellitus. His A1c is improved. His blood sugars at home are significantly improved as he has lost weight. He has had no episodes of low blood sugar. He denies any visual changes. He has not had increased frequency of urination and no increased thirst. #2. Hypertension: Taking all his medications regularly without problem. No chest pain.  #3. Followup back pain status post second surgery for herniated disc. He is doing better. Still using his pain medication fairly regularly as he and 23 times a day. He tried to get back to work in the next 2 weeks if at all possible. He feels hopeful about this.  Review of Systems    denies unusual weight change except for some intentional weight loss, denies fever, sweats, chills Objective:   Physical Exam  Vital signs reviewed. GENERAL: Well-developed, well-nourished, no acute distress. CARDIOVASCULAR: Regular rate and rhythm no murmur gallop or rub LUNGS: Clear to auscultation bilaterally, no rales or wheeze. ABDOMEN: Soft positive bowel sounds NEURO: No gross focal neurological deficits. MSK: Movement of extremity x 4.       Assessment & Plan:  #1. Diabetes mellitus. With improvement A1c will discontinue his safari rhea today. Recheck in 6-8 weeks #2 hypertension well-controlled continue current medications #3. Status post disc surgery x2. Much improved. We'll continue his chronic narcotic medication for now. I suspect he'll be able to taper off within the next 2 months. I'll see him back in 2 months.

## 2012-02-21 NOTE — Telephone Encounter (Signed)
Note is FYI.  Will route note to Dr. Jennette Kettle.  Senaida Ores, Maryjean Ka, RN

## 2012-02-21 NOTE — Telephone Encounter (Signed)
Did a UA on patient and his protein was 4+ Everything else was OK.  No need to call back.

## 2012-03-12 ENCOUNTER — Other Ambulatory Visit: Payer: Self-pay | Admitting: Cardiology

## 2012-03-12 MED ORDER — SIMVASTATIN 20 MG PO TABS: 20.0000 mg | ORAL_TABLET | Freq: Every day | ORAL | Status: DC

## 2012-03-12 MED ORDER — METOPROLOL TARTRATE 100 MG PO TABS: 100.0000 mg | ORAL_TABLET | Freq: Two times a day (BID) | ORAL | Status: DC

## 2012-03-12 NOTE — Telephone Encounter (Signed)
Refill   Verified preferred as [facility], patient can be reached at [phone] for additional info.

## 2012-03-13 ENCOUNTER — Other Ambulatory Visit: Payer: Self-pay | Admitting: Cardiology

## 2012-03-13 MED ORDER — METOPROLOL TARTRATE 100 MG PO TABS: 100.0000 mg | ORAL_TABLET | Freq: Two times a day (BID) | ORAL | Status: DC

## 2012-03-13 MED ORDER — SIMVASTATIN 20 MG PO TABS: 20.0000 mg | ORAL_TABLET | Freq: Every day | ORAL | Status: DC

## 2012-03-13 NOTE — Telephone Encounter (Signed)
Pt is out of pills and [facility] didn't receive request

## 2012-03-24 ENCOUNTER — Telehealth: Payer: Self-pay

## 2012-03-24 NOTE — Telephone Encounter (Signed)
Called refills in

## 2012-04-30 ENCOUNTER — Ambulatory Visit (INDEPENDENT_AMBULATORY_CARE_PROVIDER_SITE_OTHER): Payer: Medicare Other | Admitting: Cardiology

## 2012-04-30 ENCOUNTER — Encounter: Payer: Self-pay | Admitting: Cardiology

## 2012-04-30 VITALS — BP 147/93 | HR 67 | Ht 72.0 in | Wt 265.0 lb

## 2012-04-30 DIAGNOSIS — I10 Essential (primary) hypertension: Secondary | ICD-10-CM

## 2012-04-30 MED ORDER — AMLODIPINE BESYLATE 10 MG PO TABS: 10.0000 mg | ORAL_TABLET | ORAL | Status: DC

## 2012-04-30 MED ORDER — BENAZEPRIL HCL 40 MG PO TABS: 40.0000 mg | ORAL_TABLET | Freq: Every day | ORAL | Status: DC

## 2012-04-30 MED ORDER — SIMVASTATIN 20 MG PO TABS: 20.0000 mg | ORAL_TABLET | Freq: Every day | ORAL | Status: DC

## 2012-04-30 MED ORDER — FUROSEMIDE 80 MG PO TABS: 40.0000 mg | ORAL_TABLET | Freq: Every day | ORAL | Status: DC | PRN

## 2012-04-30 MED ORDER — METOPROLOL TARTRATE 100 MG PO TABS: 100.0000 mg | ORAL_TABLET | Freq: Two times a day (BID) | ORAL | Status: DC

## 2012-04-30 MED ORDER — DOXAZOSIN MESYLATE 8 MG PO TABS: 8.0000 mg | ORAL_TABLET | Freq: Every day | ORAL | Status: DC

## 2012-04-30 MED ORDER — HYDRALAZINE HCL 50 MG PO TABS: ORAL_TABLET | ORAL | Status: DC

## 2012-04-30 MED ORDER — POTASSIUM CHLORIDE ER 10 MEQ PO TBCR: 10.0000 meq | EXTENDED_RELEASE_TABLET | Freq: Every day | ORAL | Status: DC

## 2012-04-30 NOTE — Assessment & Plan Note (Signed)
Patient counseled on discontinuing. 

## 2012-04-30 NOTE — Assessment & Plan Note (Signed)
Patient's blood pressure remains mildly elevated. Increase hydralazine to 75 mg by mouth 3 times a day. Potassium and renal function is monitored by primary care.

## 2012-04-30 NOTE — Assessment & Plan Note (Signed)
Management per primary care. 

## 2012-04-30 NOTE — Patient Instructions (Addendum)
Your physician wants you to follow-up in: 6 MONTHS WITH DR Jens Som [place] You will receive a reminder letter in the mail two months in advance. If you don't receive a letter, please call our office to schedule the follow-up appointment.   INCREASE HYDRALAZINE TO 50 MG ONE AND ONE HALF TABLETS THREE TIMES DAILY

## 2012-04-30 NOTE — Progress Notes (Signed)
HPI: Pleasant male for Fu of hypertension. Cath in Feb 2002 revealed EF 50-55 and normal coronaries. CTA Jan 2006 showed no RAS. Last myoview 06/07/08 and revealed normal perfusion and EF 37 felt related to hypertensive heart disease. Echocardiogram in Nov 2012 showed EF 50-55, moderate to severe LVH and mild biatrial enlargement. Patient admitted in October 2012 with hypertensive urgency. Medications were resumed and his blood pressure improved. TSH was mildly elevated. Since I last saw him in Dec 2012, the patient denies any dyspnea on exertion, orthopnea, PND, pedal edema, palpitations, syncope or chest pain.   Current Outpatient Prescriptions  Medication Sig Dispense Refill  . allopurinol (ZYLOPRIM) 100 MG tablet Take 100 mg by mouth daily.      Marland Kitchen amLODipine (NORVASC) 10 MG tablet Take 10 mg by mouth every morning.       . benazepril (LOTENSIN) 40 MG tablet Take 40 mg by mouth daily.      Marland Kitchen doxazosin (CARDURA) 8 MG tablet Take 8 mg by mouth daily.      . furosemide (LASIX) 80 MG tablet Take 40-80 mg by mouth daily as needed. for lower extremity edema      . hydrALAZINE (APRESOLINE) 50 MG tablet Take 50 mg by mouth 3 (three) times daily.      . metFORMIN (GLUCOPHAGE) 1000 MG tablet Take 1 tablet (1,000 mg total) by mouth 2 (two) times daily.  180 tablet  3  . metoprolol (LOPRESSOR) 100 MG tablet Take 1 tablet (100 mg total) by mouth 2 (two) times daily.  180 tablet  3  . oxycodone (ROXICODONE) 30 MG immediate release tablet Take one by mouth every 8 hrs as needed for pain Do not fill before thirty days from the date of this prescription  90 tablet  0  . potassium chloride (K-DUR) 10 MEQ tablet Take 10 mEq by mouth daily.      . simvastatin (ZOCOR) 20 MG tablet Take 1 tablet (20 mg total) by mouth daily.  90 tablet  3     Past Medical History  Diagnosis Date  . Renal insufficiency, mild   . OA (osteoarthritis) of knee     right; mod-severe  . DM (diabetes mellitus), type 2     mild mod  sensory loss plantar feet  . OA (osteoarthritis)   . History of colonoscopy   . Peripheral autonomic neuropathy due to secondary diabetes   . Hypothyroidism     since thyroidectomy  . Anxiety     takes buspar  . Sleep apnea     on cpap  . Complication of anesthesia     pt. reports he "flat lined " during surgery in  1999, told that  he had to be revived .  Pt. reports that it was surg. to replace his knee & [facility] [place].  [facility]- no record avail. on this pt.   Marland Kitchen HTN (hypertension)     labile, followed by Dr. Jens Som    Past Surgical History  Procedure Date  . Total knee arthroplasty 2001    left  . Thyroidectomy, partial     remote  . Cardiolite--no cad ef 34% 01/01/2005  . Joint replacement 2000    left knee   . Lumbar laminectomy/decompression microdiscectomy 09/25/2011    Procedure: LUMBAR LAMINECTOMY/DECOMPRESSION MICRODISCECTOMY;  Surgeon: Karn Cassis;  Location: [facility];  Service: Neurosurgery;  Laterality: Right;  Right Lumbar Five-Sacral One Microdiscectomy  . Back surgery  11/12  . Lumbar laminectomy/decompression microdiscectomy 01/03/2012    Procedure: LUMBAR LAMINECTOMY/DECOMPRESSION MICRODISCECTOMY;  Surgeon: Karn Cassis, MD;  Location: [facility];  Service: Neurosurgery;  Laterality: Right;  Right L5-S1 Redo Diskectomy    History   Social History  . Marital Status: Married    Spouse Name: N/A    Number of Children: 2  . Years of Education: N/A   Occupational History  . Not on file.   Social History Main Topics  . Smoking status: Current Everyday Smoker -- 1.0 packs/day for 28 years    Types: Cigarettes  . Smokeless tobacco: Never Used   Comment: wants to get back pain under control then wants to discuss  . Alcohol Use: No  . Drug Use: No  . Sexually Active: Not on file   Other Topics Concern  . Not on file   Social History Narrative  . No narrative on file    ROS: significant  back and right lower extremity pain but no fevers or chills, productive cough, hemoptysis, dysphasia, odynophagia, melena, hematochezia, dysuria, hematuria, rash, seizure activity, orthopnea, PND, pedal edema, claudication. Remaining systems are negative.  Physical Exam: Well-developed well-nourished in no acute distress.  Skin is warm and dry.  HEENT is normal.  Neck is supple.  Chest is clear to auscultation with normal expansion.  Cardiovascular exam is regular rate and rhythm.  Abdominal exam nontender or distended. No masses palpated. Extremities show no edema. neuro grossly intact  ECG Sinus rhythm, first degree AV block, right axis deviation, nonspecific ST changes.

## 2012-05-06 ENCOUNTER — Ambulatory Visit (INDEPENDENT_AMBULATORY_CARE_PROVIDER_SITE_OTHER): Payer: Medicare Other | Admitting: Family Medicine

## 2012-05-06 ENCOUNTER — Encounter: Payer: Self-pay | Admitting: Family Medicine

## 2012-05-06 VITALS — BP 138/78 | HR 72 | Temp 97.7°F | Ht 72.0 in | Wt 261.0 lb

## 2012-05-06 DIAGNOSIS — Z79899 Other long term (current) drug therapy: Secondary | ICD-10-CM

## 2012-05-06 DIAGNOSIS — E118 Type 2 diabetes mellitus with unspecified complications: Secondary | ICD-10-CM

## 2012-05-06 DIAGNOSIS — IMO0002 Reserved for concepts with insufficient information to code with codable children: Secondary | ICD-10-CM

## 2012-05-06 DIAGNOSIS — I10 Essential (primary) hypertension: Secondary | ICD-10-CM

## 2012-05-06 DIAGNOSIS — M5410 Radiculopathy, site unspecified: Secondary | ICD-10-CM

## 2012-05-06 LAB — POCT GLYCOSYLATED HEMOGLOBIN (HGB A1C): Hemoglobin A1C: 7

## 2012-05-06 MED ORDER — OXYCODONE HCL 30 MG PO TABS: 30.0000 mg | ORAL_TABLET | Freq: Four times a day (QID) | ORAL | Status: AC | PRN

## 2012-05-08 ENCOUNTER — Encounter: Payer: Self-pay | Admitting: Family Medicine

## 2012-05-08 NOTE — Assessment & Plan Note (Signed)
Excellent control today with his A1c less than 7. I congratulated him on his extremely hard work. Followup in 3 months. He has followup in one month for his chronic low back pain.

## 2012-05-08 NOTE — Assessment & Plan Note (Signed)
Continue current medications. We discussed his slight amount of weight increase.

## 2012-05-08 NOTE — Assessment & Plan Note (Signed)
Status post 2 surgical procedures. Has easily more he's having increased pain. I really want to encourage his mobility so he can keep his weight down and get back to a more functional level. I know he would like to go back to work and financially they need him to go back to work. We'll try increasing his pain medicine regimen to one tablet every 6 hours and see how that does. He really does not want to be on narcotics long-term is feeling very depressed about that the we discussed at length. I suspect over the next 10-12 months he will continue to improve is currently getting his activity back up will work toward that end.

## 2012-05-08 NOTE — Progress Notes (Signed)
  Subjective:    Patient ID: [name] [name], male    DOB: Apr 06, 1956, 56 y.o.   MRN: [id number]  HPI  #1. Followup chronic low back and radicular pain down the right leg. He is better but his pain medicine is really only lasting 7 hours. He has been trying to get back into exercise because he started to at his weight back:. He has increased pain if he tries to walk on the treadmill for more than about 10 minutes. He is very frustrated. He felt like he was just beginning to make headway, improving his diabetic numbers, losing weight, had just gotten a job. #2. Diabetes mellitus. No episodes of low blood sugar. He should try to watch his diet. Exercise as above. #3. Coronary artery disease: Denies chest pain or shortness of breath on exertion. No lower stream any edema. He's taking all his medicines regularly and having no issues.  Review of Systems    denies unusual weight change, fever, sweats, chills. Please see history of present illness above for additional pertinent review of systems. Objective:   Physical Exam  Vital signs reviewed. GENERAL: Well-developed, well-nourished, no acute distress. CARDIOVASCULAR: Regular rate and rhythm no murmur gallop or rub LUNGS: Clear to auscultation bilaterally, no rales or wheeze. ABDOMEN: Soft positive bowel sounds NEURO: No gross focal neurological deficits. MSK: Movement of extremity x 4. BACK: Nontender to palpation. Still has limited flexion at the hips to about 45 or 50. Lower stream he strength is 5 out of 5. DTRs 2+ bilaterally symmetrical equal at the knee. Straight leg raise still positive.        Assessment & Plan:

## 2012-05-25 ENCOUNTER — Ambulatory Visit (INDEPENDENT_AMBULATORY_CARE_PROVIDER_SITE_OTHER): Payer: Medicare Other | Admitting: Home Health Services

## 2012-05-25 ENCOUNTER — Encounter: Payer: Self-pay | Admitting: Home Health Services

## 2012-05-25 VITALS — BP 137/94 | HR 72 | Temp 98.3°F | Ht 72.0 in | Wt 259.0 lb

## 2012-05-25 DIAGNOSIS — Z0000 Encounter for general adult medical examination without abnormal findings: Secondary | ICD-10-CM

## 2012-05-25 NOTE — Progress Notes (Signed)
Patient here for annual wellness visit, patient reports: Risk Factors/Conditions needing evaluation or treatment: Pt does not have any new risk factors that need evaluation. Home Safety: Pt lives with wife in 1 story home. Pt reports having smoke detectors. Other Information: Corrective lens: Pt wears daily corrective lens.  Try's to go to eye dr annually. Dentures: Pt does not have dentures.   Memory: Pt denies memory problems.  Patient's Mini Mental Score (recorded in doc. flowsheet): 30  Balance/Gait: Pt does not have any noticeable impairment. Balance Abnormal Patient value  Sitting balance    Sit to stand    Attempts to arise    Immediate standing balance    Standing balance    Nudge    Eyes closed- Romberg    Tandem stance    Back lean    Neck Rotation    360 degree turn    Sitting down     Gait Abnormal Patient value  Initiation of gait    Step length-left    Step length-right    Step height-left    Step height-right    Step symmetry    Step continuity    Path deviation    Trunk movement    Walking stance        Annual Wellness Visit Requirements Recorded Today In  Medical, family, social history Past Medical, Family, Social History Section  Current providers Care team  Current medications Medications  Wt, BP, Ht, BMI Vital signs  Hearing assessment (welcome visit) Hearing/vision  Tobacco, alcohol, illicit drug use History  ADL Nurse Assessment  Depression Screening Nurse Assessment  Cognitive impairment Nurse Assessment  Mini Mental Status Document Flowsheet  Fall Risk Nurse Assessment  Home Safety Progress Note  End of Life Planning (welcome visit) Social Documentation  Medicare preventative services Progress Note  Risk factors/conditions needing evaluation/treatment Progress Note  Personalized health advice Patient Instructions, goals, letter  Diet & Exercise Social Documentation  Emergency Contact Social Documentation  Seat Belts Social  Documentation  Sun exposure/protection Social Documentation    Medicare Prevention Plan:   Recommended Medicare Prevention Screenings Men over 65 Test For Frequency Date of Last- BOLD if needed  Colorectal Cancer 1-10 yrs Pt reported done in 2010.  Prostate Cancer Never or yearly   Aortic Aneurysm Once if 65-75 with hx of smoking NI-due to age  Cholesterol 5 yrs 2013  Diabetes yearly 2013  HIV yearly declined  Influenza Shot yearly 2012  Pneumonia Shot once NI-due to age  Zostavax Shot once NI-due to age

## 2012-05-25 NOTE — Patient Instructions (Signed)
1. Continue to work on weight loss. 2. Continue to go to [facility] 2-3 times a week. 3. Consider setting a quit smoking date. 4. Continue to take your blood pressure at home and keep a record.

## 2012-06-02 ENCOUNTER — Encounter: Payer: Self-pay | Admitting: Home Health Services

## 2012-06-02 NOTE — Progress Notes (Signed)
Patient ID: [name] [name], male   DOB: 07/11/1956, 56 y.o.   MRN: [id number]  I have reviewed chart including medications, problem list, allergies, past medical and surgical history, social history. I have discussed with Arlys John and agree with docoumentation. Denny Levy

## 2012-06-10 ENCOUNTER — Ambulatory Visit (INDEPENDENT_AMBULATORY_CARE_PROVIDER_SITE_OTHER): Payer: Medicare Other | Admitting: Family Medicine

## 2012-06-10 ENCOUNTER — Encounter: Payer: Self-pay | Admitting: Family Medicine

## 2012-06-10 VITALS — BP 139/93 | HR 70 | Temp 96.9°F | Ht 72.0 in | Wt 261.1 lb

## 2012-06-10 DIAGNOSIS — N390 Urinary tract infection, site not specified: Secondary | ICD-10-CM

## 2012-06-10 DIAGNOSIS — M5410 Radiculopathy, site unspecified: Secondary | ICD-10-CM

## 2012-06-10 DIAGNOSIS — IMO0002 Reserved for concepts with insufficient information to code with codable children: Secondary | ICD-10-CM

## 2012-06-10 LAB — POCT URINALYSIS DIPSTICK
Bilirubin, UA: NEGATIVE
Blood, UA: NEGATIVE
Glucose, UA: NEGATIVE
Nitrite, UA: NEGATIVE
Urobilinogen, UA: 0.2
pH, UA: 5.5

## 2012-06-10 LAB — POCT UA - MICROSCOPIC ONLY

## 2012-06-10 MED ORDER — CIPROFLOXACIN HCL 250 MG PO TABS: 500.0000 mg | ORAL_TABLET | Freq: Two times a day (BID) | ORAL | Status: DC

## 2012-06-10 MED ORDER — OXYCODONE HCL 30 MG PO TABS: 30.0000 mg | ORAL_TABLET | Freq: Four times a day (QID) | ORAL | Status: DC | PRN

## 2012-06-13 ENCOUNTER — Encounter: Payer: Self-pay | Admitting: Family Medicine

## 2012-06-13 NOTE — Progress Notes (Signed)
  Subjective:    Patient ID: [name] [name], male    DOB: 10/28/1956, 56 y.o.   MRN: [id number]  HPI F/u low back psin s/p surgery About the same Pain meds last 6 hrsonly since he is becoming more activity  Some urinary irritationlast week--burning   Review of Systems Pertinent review of systems: negative for fever or unusual weight change.     Objective:   Physical Exam Vital signs reviewed GENERALl: Well developed, well nourished, in no acute distress. NECK: Supple, FROM, without lymphadenopathy.  LUNGS: clear to auscultation bilaterally. No wheezes or rales. HEART: Regular rate and rhythm, no murmurs ABDOMEN: soft with positive bowel sounds MSK: MOE x 4 SKIN no rash NEURO: no focal deficits BACK heaed lumbar area scar; no ttp CVA       Assessment & Plan:  Ts for subacute uti Refill pain meds---increase frequency F/u 1 m

## 2012-06-24 NOTE — Discharge Summary (Signed)
See progress notes Brian Crenshaw  

## 2012-07-13 ENCOUNTER — Other Ambulatory Visit: Payer: Self-pay | Admitting: Family Medicine

## 2012-07-15 ENCOUNTER — Ambulatory Visit (INDEPENDENT_AMBULATORY_CARE_PROVIDER_SITE_OTHER): Payer: Medicare Other | Admitting: Family Medicine

## 2012-07-15 ENCOUNTER — Encounter: Payer: Self-pay | Admitting: Family Medicine

## 2012-07-15 VITALS — BP 140/85 | HR 74 | Temp 97.6°F | Ht 72.0 in | Wt 260.0 lb

## 2012-07-15 DIAGNOSIS — IMO0002 Reserved for concepts with insufficient information to code with codable children: Secondary | ICD-10-CM

## 2012-07-15 DIAGNOSIS — F17200 Nicotine dependence, unspecified, uncomplicated: Secondary | ICD-10-CM

## 2012-07-15 DIAGNOSIS — Z79899 Other long term (current) drug therapy: Secondary | ICD-10-CM

## 2012-07-15 DIAGNOSIS — I10 Essential (primary) hypertension: Secondary | ICD-10-CM

## 2012-07-15 DIAGNOSIS — M5410 Radiculopathy, site unspecified: Secondary | ICD-10-CM

## 2012-07-15 DIAGNOSIS — E118 Type 2 diabetes mellitus with unspecified complications: Secondary | ICD-10-CM

## 2012-07-15 LAB — COMPREHENSIVE METABOLIC PANEL
ALT: 11 U/L (ref 0–53)
CO2: 24 mEq/L (ref 19–32)
Calcium: 9.1 mg/dL (ref 8.4–10.5)
Chloride: 111 mEq/L (ref 96–112)
Glucose, Bld: 138 mg/dL — ABNORMAL HIGH (ref 70–99)
Sodium: 142 mEq/L (ref 135–145)
Total Protein: 6.6 g/dL (ref 6.0–8.3)

## 2012-07-15 LAB — POCT GLYCOSYLATED HEMOGLOBIN (HGB A1C): Hemoglobin A1C: 6.3

## 2012-07-15 MED ORDER — OXYCODONE HCL 30 MG PO TABS: 30.0000 mg | ORAL_TABLET | Freq: Four times a day (QID) | ORAL | Status: DC | PRN

## 2012-07-16 ENCOUNTER — Encounter: Payer: Self-pay | Admitting: Family Medicine

## 2012-07-16 NOTE — Progress Notes (Signed)
  Subjective:    Patient ID: [name] [name], male    DOB: February 04, 1956, 56 y.o.   MRN: [id number]  HPI Followup radicular back pain status post 2 recent surgeries for herniated disc x2. Continues on current medications would like to get off them. Does feel like he's made about 10% or 20% improvement since I saw him last. Still having radicular pain shooting down his leg however. No new symptoms. No incontinence. #2. Tobacco dependence. He would really like to stop smoking. Is unsure whether he should try to force himself to quit now or wait till things have settled down a little but better specifically his financial issues, his back pain. #3. Hypertension seems stable. He is not taking his medicine yet today. No problems with it. Denies chest pain, shortness of breath. #4. Diabetes mellitus. No episodes of low blood sugar. He's really trying to follow a good diet and do some exercise although he is limited by back pain.   Review of Systems See history of present illness. Additionally denies fever, sweats, chills, unusual weight change.    Objective:   Physical Exam  Vital signs reviewed GENERALl: Well developed, well nourished, in no acute distress. NECK: Supple, FROM, without lymphadenopathy.  THYROID: normal without nodularity CAROTID ARTERIES: without bruits LUNGS: clear to auscultation bilaterally. No wheezes or rales. HEART: Regular rate and rhythm, no murmurs ABDOMEN: soft with positive bowel sounds MSK: MOE x 4        Assessment & Plan:

## 2012-07-16 NOTE — Assessment & Plan Note (Signed)
He does see me making slow but steady improvement in his pain. Gave him refill today. We'll follow up one month.

## 2012-07-16 NOTE — Assessment & Plan Note (Signed)
Blood pressure pretty stable. He did not take his medicine yet this morning so to little unclear exactly how great control we have. No medication changes. We just did some blood work on him checking his kidney function so we don't need to do that for another few months.

## 2012-07-16 NOTE — Assessment & Plan Note (Signed)
Discussed smoking cessation. I feel like he's still in the contemplative state. I doubt that if he tried to force himself to quit right now would be successful so all in all would rather wait another couple months and then hopefully we can make a successful attempt at cessation.

## 2012-07-20 ENCOUNTER — Encounter: Payer: Self-pay | Admitting: Family Medicine

## 2012-08-12 ENCOUNTER — Other Ambulatory Visit: Payer: Self-pay | Admitting: Family Medicine

## 2012-08-12 MED ORDER — OXYCODONE HCL 30 MG PO TABS: 30.0000 mg | ORAL_TABLET | Freq: Four times a day (QID) | ORAL | Status: DC | PRN

## 2012-08-12 NOTE — Progress Notes (Signed)
Gave rx to his wife at her ov

## 2012-08-26 ENCOUNTER — Ambulatory Visit (INDEPENDENT_AMBULATORY_CARE_PROVIDER_SITE_OTHER): Payer: Medicare Other | Admitting: Family Medicine

## 2012-08-26 ENCOUNTER — Encounter: Payer: Self-pay | Admitting: Family Medicine

## 2012-08-26 VITALS — BP 152/95 | HR 74 | Temp 98.0°F | Wt 259.7 lb

## 2012-08-26 DIAGNOSIS — M5410 Radiculopathy, site unspecified: Secondary | ICD-10-CM

## 2012-08-26 DIAGNOSIS — E118 Type 2 diabetes mellitus with unspecified complications: Secondary | ICD-10-CM

## 2012-08-26 DIAGNOSIS — I10 Essential (primary) hypertension: Secondary | ICD-10-CM

## 2012-08-26 DIAGNOSIS — IMO0002 Reserved for concepts with insufficient information to code with codable children: Secondary | ICD-10-CM

## 2012-08-26 MED ORDER — OXYCODONE HCL 30 MG PO TABS: ORAL_TABLET | ORAL | Status: DC

## 2012-08-26 NOTE — Progress Notes (Signed)
  Subjective:    Patient ID: [name] [name], male    DOB: 30-Mar-1956, 56 y.o.   MRN: [id number]  HPI #1. Followup low back pain and radiculopathy status post 2 back surgeries. He continues to do well with some improvement since I saw him last. He is still using his pain medicine regularly and feels like he cannot do without it but he is being more active at home. He is having some stressors with his wife's diabetes mellitus #2. Diabetes mellitus. He has some questions about diet. He has had no episodes of low blood sugar. He is taking his medicines regularly #3. Hypertension well controlled without any problem. Taking his medications regularly.  Review of Systems Denies unusual weight change, fever, sweats, chills    Objective:   Physical Exam  Vital signs reviewed. GENERAL: Well-developed, well-nourished, no acute distress. CARDIOVASCULAR: Regular rate and rhythm no murmur gallop or rub LUNGS: Clear to auscultation bilaterally, no rales or wheeze. ABDOMEN: Soft positive bowel sounds NEURO: No gross focal neurological deficits. MSK: Movement of extremity x 4.        Assessment & Plan:  #1. Radicular low back pain which seems to be gradually improving. Are seem to continue to try to be more and more active. I refilled his pain medicines today I will see him back in 2 months #2. Diabetes mellitus. I discussed this with him him at some length. He seems to be doing fairly well. He gave him a lot of handouts and information as I think what the stressors is the fact that his wife's diabetes is not as well controlled. #3. Hypertension cardiovascular disease which currently seems stable. I'll see him back in 2 months

## 2012-10-13 ENCOUNTER — Telehealth: Payer: Self-pay | Admitting: *Deleted

## 2012-10-13 MED ORDER — OXYCODONE HCL 30 MG PO TABS: ORAL_TABLET | ORAL | Status: DC

## 2012-10-13 NOTE — Telephone Encounter (Signed)
Dear [facility] A[facility] Team Unfortunately I am out sick with laryngitis yesterday and today (and I had already scheduled PAL for tomorrow) so I will not be in office until Thursday. If he needs it before then, please see if Dr Leveda Anna or someone will write it I have written one below if they can just sign it off and give him a rx THANKS! Denny Levy

## 2012-10-13 NOTE — Telephone Encounter (Signed)
Would like rx for oxycodone please place up front when ready.Loralee Pacas [place]

## 2012-10-13 NOTE — Telephone Encounter (Signed)
rx placed up front for p/u.[name] [name] [place]  

## 2012-10-20 ENCOUNTER — Encounter: Payer: Self-pay | Admitting: *Deleted

## 2012-10-20 NOTE — Telephone Encounter (Signed)
This encounter was created in error - please disregard.

## 2012-10-27 ENCOUNTER — Ambulatory Visit (INDEPENDENT_AMBULATORY_CARE_PROVIDER_SITE_OTHER): Payer: Medicare Other | Admitting: Cardiology

## 2012-10-27 ENCOUNTER — Other Ambulatory Visit: Payer: Self-pay | Admitting: Cardiology

## 2012-10-27 ENCOUNTER — Encounter: Payer: Self-pay | Admitting: Cardiology

## 2012-10-27 VITALS — BP 160/108 | HR 71 | Ht 72.0 in | Wt 259.0 lb

## 2012-10-27 DIAGNOSIS — I10 Essential (primary) hypertension: Secondary | ICD-10-CM

## 2012-10-27 MED ORDER — HYDRALAZINE HCL 100 MG PO TABS: 100.0000 mg | ORAL_TABLET | Freq: Three times a day (TID) | ORAL | Status: DC

## 2012-10-27 NOTE — Patient Instructions (Addendum)
Your physician wants you to follow-up in: 1 year.  You will receive a reminder letter in the mail two months in advance. If you don't receive a letter, please call our office to schedule the follow-up appointment.  Your physician has recommended you make the following change in your medication: INCREASE your Hydralazine to 100 mg three times daily  Your physician has requested that you have an echocardiogram. Echocardiography is a painless test that uses sound waves to create images of your heart. It provides your doctor with information about the size and shape of your heart and how well your heart's chambers and valves are working. This procedure takes approximately one hour. There are no restrictions for this procedure.

## 2012-10-27 NOTE — Assessment & Plan Note (Signed)
Blood pressure elevated. He states it typically runs 130/100. I will increase his hydralazine to 100 mg by mouth 3 times a day. Given history of cardiomyopathy I will repeat his echocardiogram. He will track his blood pressure at home and we'll add additional medications as needed.

## 2012-10-27 NOTE — Assessment & Plan Note (Signed)
Management per primary care. 

## 2012-10-27 NOTE — Assessment & Plan Note (Signed)
Patient counseled on discontinuing. 

## 2012-10-27 NOTE — Progress Notes (Signed)
HPI: Pleasant male for Fu of hypertension. Cath in Feb 2002 revealed EF 50-55 and normal coronaries. CTA Jan 2006 showed no RAS. Last myoview 06/07/08 and revealed normal perfusion and EF 37 felt related to hypertensive heart disease. Echocardiogram in Nov 2012 showed EF 50-55, moderate to severe LVH and mild biatrial enlargement. Patient admitted in October 2012 with hypertensive urgency. TSH was mildly elevated. Since I last saw him in June of 2013, he denies dyspnea, chest pain, palpitations or syncope.   Current Outpatient Prescriptions  Medication Sig Dispense Refill  . allopurinol (ZYLOPRIM) 100 MG tablet Take 100 mg by mouth daily.      Marland Kitchen amLODipine (NORVASC) 10 MG tablet Take 1 tablet (10 mg total) by mouth every morning.  30 tablet  12  . benazepril (LOTENSIN) 40 MG tablet Take 1 tablet (40 mg total) by mouth daily.  30 tablet  12  . ciprofloxacin (CIPRO) 250 MG tablet TAKE 2 TABLETS (500 MG TOTAL) BY MOUTH 2 (TWO) TIMES DAILY.  14 tablet  0  . doxazosin (CARDURA) 8 MG tablet Take 1 tablet (8 mg total) by mouth daily.  30 tablet  12  . furosemide (LASIX) 80 MG tablet Take 0.5-1 tablets (40-80 mg total) by mouth daily as needed. for lower extremity edema  30 tablet  12  . hydrALAZINE (APRESOLINE) 50 MG tablet Take ONE AND ONE HALF TABLETS THREE TIMES DAILY  135 tablet  12  . metFORMIN (GLUCOPHAGE) 1000 MG tablet Take 1 tablet (1,000 mg total) by mouth 2 (two) times daily.  180 tablet  3  . metoprolol (LOPRESSOR) 100 MG tablet Take 1 tablet (100 mg total) by mouth 2 (two) times daily.  180 tablet  3  . oxycodone (ROXICODONE) 30 MG immediate release tablet Take one tablet every 6 hours as needed for back pain.  120 tablet  0  . potassium chloride (K-DUR) 10 MEQ tablet Take 1 tablet (10 mEq total) by mouth daily.  30 tablet  12  . simvastatin (ZOCOR) 20 MG tablet Take 1 tablet (20 mg total) by mouth daily.  90 tablet  3     Past Medical History  Diagnosis Date  . Renal insufficiency,  mild   . OA (osteoarthritis) of knee     right; mod-severe  . DM (diabetes mellitus), type 2     mild mod sensory loss plantar feet  . OA (osteoarthritis)   . History of colonoscopy   . Peripheral autonomic neuropathy due to secondary diabetes   . Hypothyroidism     since thyroidectomy  . Anxiety     takes buspar  . Sleep apnea     on cpap  . Complication of anesthesia     pt. reports he "flat lined " during surgery in  1999, told that  he had to be revived .  Pt. reports that it was surg. to replace his knee & [facility] [place].  [facility] to [facility]- no record avail. on this pt.   Marland Kitchen HTN (hypertension)     labile, followed by Dr. Jens Som    Past Surgical History  Procedure Date  . Total knee arthroplasty 2001    left  . Thyroidectomy, partial     remote  . Cardiolite--no cad ef 34% 01/01/2005  . Joint replacement 2000    left knee   . Lumbar laminectomy/decompression microdiscectomy 09/25/2011    Procedure: LUMBAR LAMINECTOMY/DECOMPRESSION MICRODISCECTOMY;  Surgeon: Karn Cassis;  Location: [facility];  Service: Neurosurgery;  Laterality: Right;  Right Lumbar Five-Sacral One Microdiscectomy  . Back surgery     11/12  . Lumbar laminectomy/decompression microdiscectomy 01/03/2012    Procedure: LUMBAR LAMINECTOMY/DECOMPRESSION MICRODISCECTOMY;  Surgeon: Karn Cassis, MD;  Location: [facility];  Service: Neurosurgery;  Laterality: Right;  Right L5-S1 Redo Diskectomy    History   Social History  . Marital Status: Married    Spouse Name: Towanna    Number of Children: 4  . Years of Education: some colle   Occupational History  . Retired-printing    Social History Main Topics  . Smoking status: Current Every Day Smoker -- 1.0 packs/day for 28 years    Types: Cigarettes  . Smokeless tobacco: Never Used     Comment: wants to get back pain under control then wants to discuss  . Alcohol Use: No  . Drug Use: No  . Sexually Active:  Not on file   Other Topics Concern  . Not on file   Social History Narrative   Health Care POA: Emergency Contact: wife, Cassell Clement [phone] of Life Plan: Who lives with you: wifeAny pets: noneDiet: Pt has a varied diet of protein, starch, and vegetables. Exercise: Pt reports exercising 1-3x a week at [facility].Seatbelts: Pt reports wearing seatbelt when in vehicles. Hobbies: fishing with cousin    ROS: chronic low back and right lower extremity pain but no fevers or chills, productive cough, hemoptysis, dysphasia, odynophagia, melena, hematochezia, dysuria, hematuria, rash, seizure activity, orthopnea, PND, pedal edema, claudication. Remaining systems are negative.  Physical Exam: Well-developed well-nourished in no acute distress.  Skin is warm and dry.  HEENT is normal.  Neck is supple.  Chest is clear to auscultation with normal expansion.  Cardiovascular exam is regular rate and rhythm.  Abdominal exam nontender or distended. No masses palpated. Extremities show no edema. neuro grossly intact  ECG Sinus with LPFB

## 2012-11-13 ENCOUNTER — Telehealth: Payer: Self-pay | Admitting: Family Medicine

## 2012-11-13 MED ORDER — OXYCODONE HCL 30 MG PO TABS: ORAL_TABLET | ORAL | Status: DC

## 2012-11-13 NOTE — Telephone Encounter (Signed)
Dear [name] [name] Team This rx is at front office for pick up. plz let him know THANKS! [name] [name]

## 2012-11-13 NOTE — Telephone Encounter (Signed)
Needs a refill on his oxycodone - he is now out

## 2012-11-16 IMAGING — CR DG CHEST 2V
2 series · 2 of 2 positions shown · non-contrast
Comparison: CT 12/21/2006.

CLINICAL DATA: 10.  Pain in the lateral aspect of the right side of
the chest.

CHEST - 2 VIEW

[w chest pa]
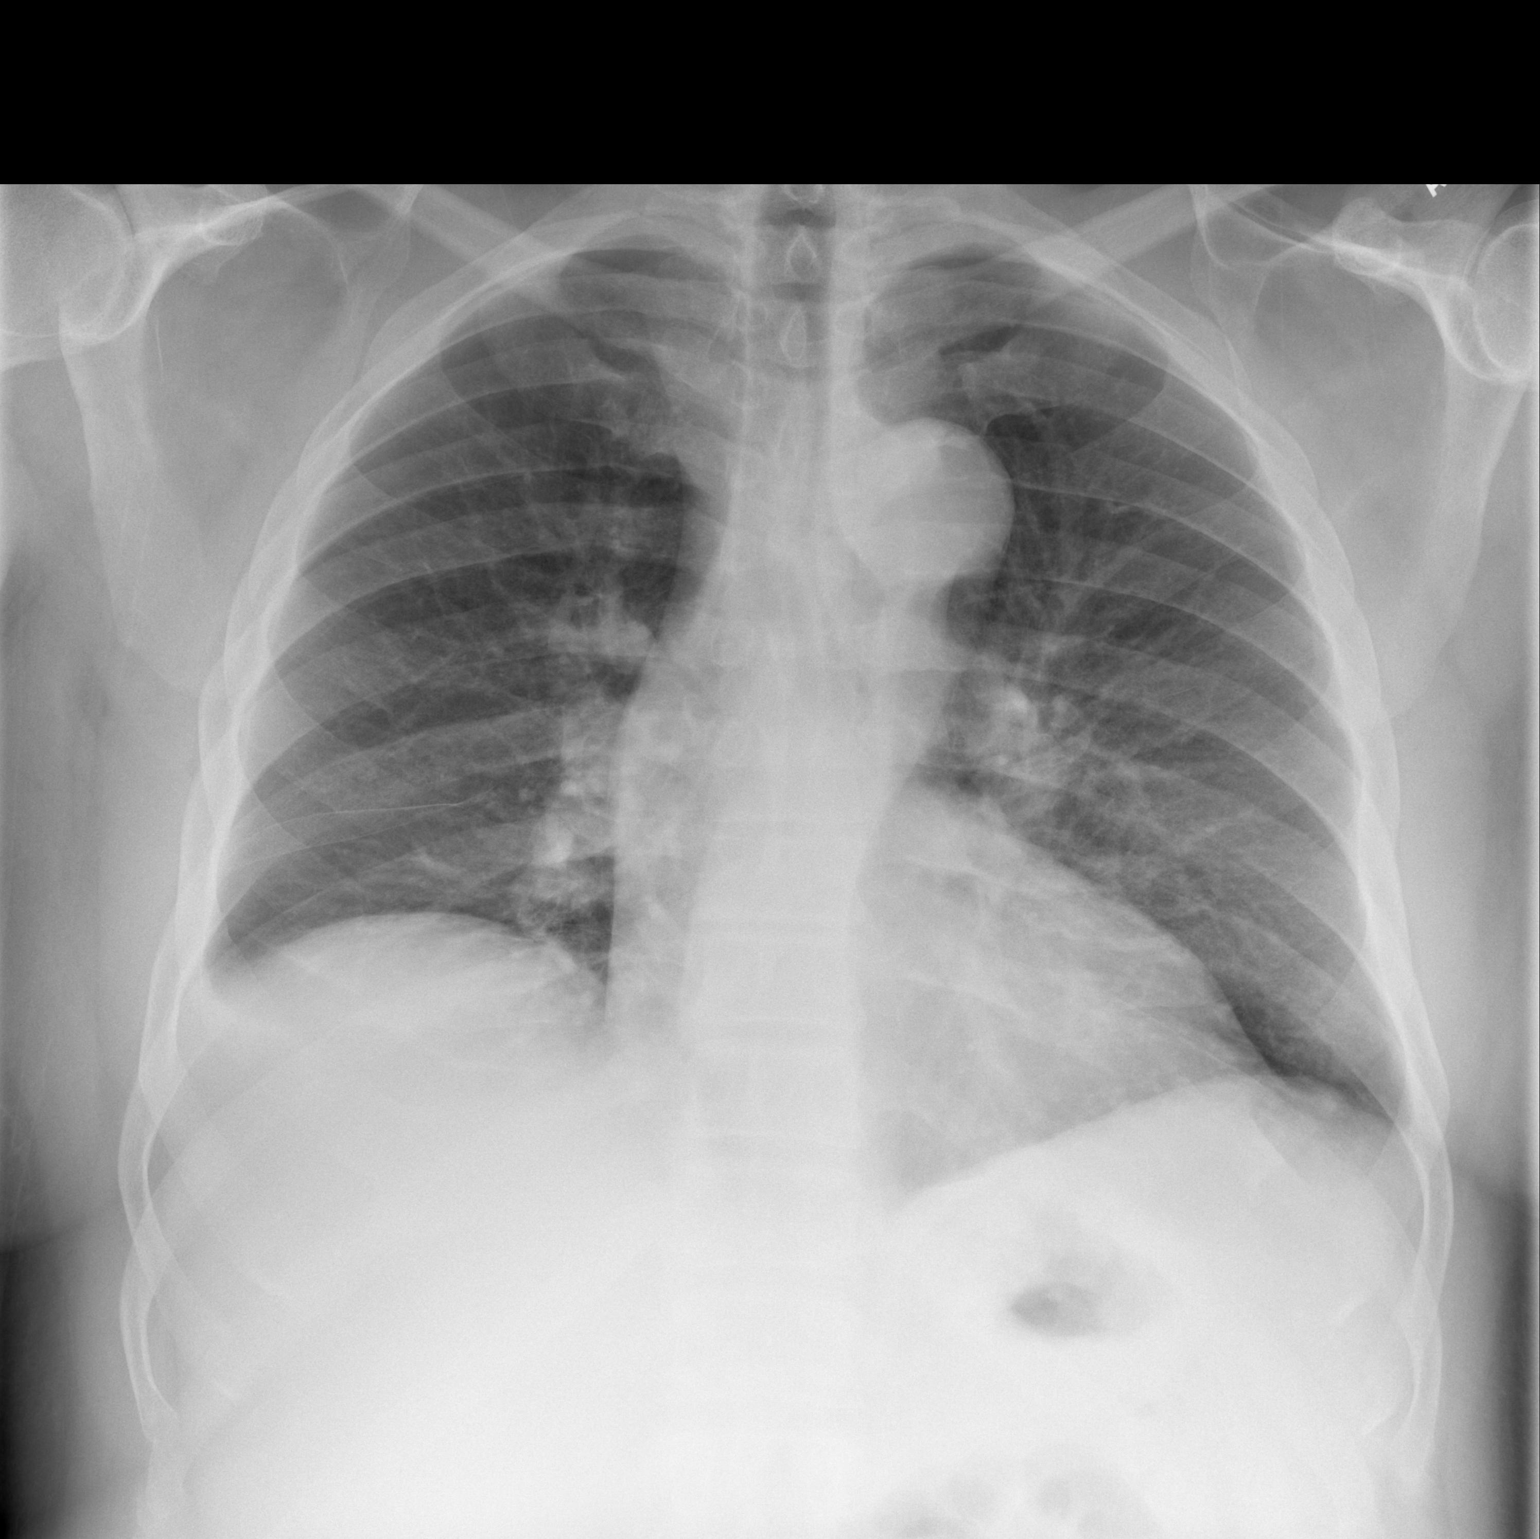

[w chest lat]
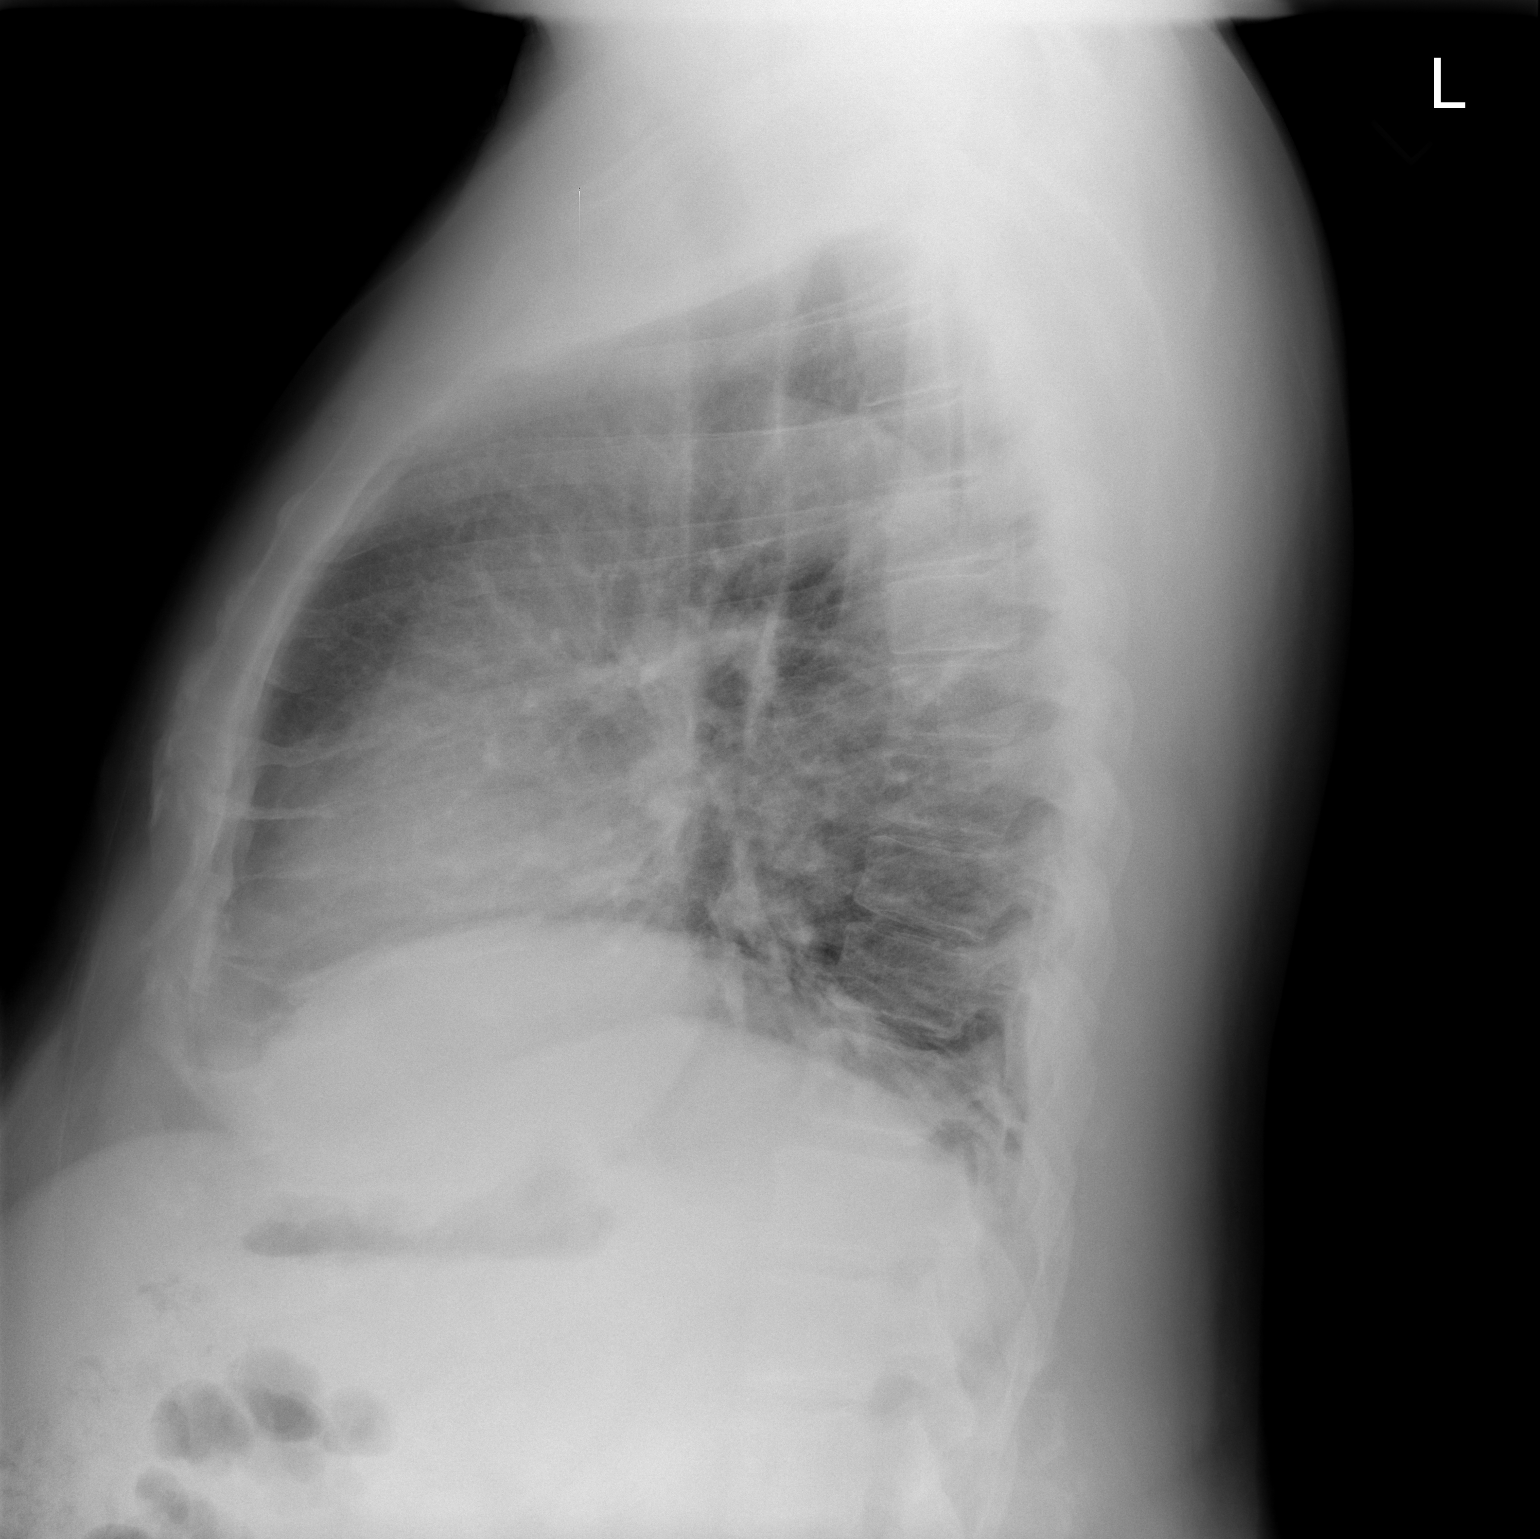

[2 of 2 positions shown; findings below may reference images not displayed]

FINDINGS: There is borderline enlargement cardiac silhouette.
There is chronic elevation of the right hemidiaphragm with minimal
right basilar atelectasis.  Ectasia and tortuosity of thoracic
aorta is seen with minimal calcifications.  There is minimal
blunting of the right costophrenic angle consistent with
noncalcific pleural thickening or trace amount of fluid in the
right lateral costophrenic angle.   Bones appear average for age.
IMPRESSION: Borderline enlargement of the cardiac silhouette.  Chronic
elevation of right hemidiaphragm with minimal right basilar
atelectasis. Pleural thickening versus a small amount of pleural
fluid in the right lateral costophrenic angle area.

## 2012-11-23 ENCOUNTER — Ambulatory Visit (HOSPITAL_COMMUNITY): Payer: Medicare Other | Attending: Cardiology | Admitting: Radiology

## 2012-11-23 DIAGNOSIS — E119 Type 2 diabetes mellitus without complications: Secondary | ICD-10-CM | POA: Insufficient documentation

## 2012-11-23 DIAGNOSIS — I369 Nonrheumatic tricuspid valve disorder, unspecified: Secondary | ICD-10-CM | POA: Insufficient documentation

## 2012-11-23 DIAGNOSIS — I428 Other cardiomyopathies: Secondary | ICD-10-CM

## 2012-11-23 DIAGNOSIS — I10 Essential (primary) hypertension: Secondary | ICD-10-CM | POA: Insufficient documentation

## 2012-11-23 DIAGNOSIS — I379 Nonrheumatic pulmonary valve disorder, unspecified: Secondary | ICD-10-CM | POA: Insufficient documentation

## 2012-11-23 DIAGNOSIS — F17200 Nicotine dependence, unspecified, uncomplicated: Secondary | ICD-10-CM | POA: Insufficient documentation

## 2012-11-23 DIAGNOSIS — I2510 Atherosclerotic heart disease of native coronary artery without angina pectoris: Secondary | ICD-10-CM | POA: Insufficient documentation

## 2012-11-23 DIAGNOSIS — I509 Heart failure, unspecified: Secondary | ICD-10-CM | POA: Insufficient documentation

## 2012-11-23 NOTE — Progress Notes (Signed)
Echocardiogram performed.  

## 2012-12-09 ENCOUNTER — Ambulatory Visit: Payer: Medicare Other | Admitting: Family Medicine

## 2012-12-16 ENCOUNTER — Ambulatory Visit (INDEPENDENT_AMBULATORY_CARE_PROVIDER_SITE_OTHER): Payer: Medicare Other | Admitting: Family Medicine

## 2012-12-16 ENCOUNTER — Encounter: Payer: Self-pay | Admitting: Family Medicine

## 2012-12-16 VITALS — BP 144/94 | HR 84 | Temp 98.7°F | Wt 261.8 lb

## 2012-12-16 DIAGNOSIS — I2510 Atherosclerotic heart disease of native coronary artery without angina pectoris: Secondary | ICD-10-CM

## 2012-12-16 DIAGNOSIS — F17200 Nicotine dependence, unspecified, uncomplicated: Secondary | ICD-10-CM

## 2012-12-16 DIAGNOSIS — IMO0002 Reserved for concepts with insufficient information to code with codable children: Secondary | ICD-10-CM

## 2012-12-16 DIAGNOSIS — E118 Type 2 diabetes mellitus with unspecified complications: Secondary | ICD-10-CM

## 2012-12-16 DIAGNOSIS — Z125 Encounter for screening for malignant neoplasm of prostate: Secondary | ICD-10-CM

## 2012-12-16 DIAGNOSIS — M5410 Radiculopathy, site unspecified: Secondary | ICD-10-CM

## 2012-12-16 LAB — COMPREHENSIVE METABOLIC PANEL
ALT: 8 U/L (ref 0–53)
AST: 12 U/L (ref 0–37)
Albumin: 3.7 g/dL (ref 3.5–5.2)
Alkaline Phosphatase: 82 U/L (ref 39–117)
BUN: 9 mg/dL (ref 6–23)
CO2: 24 mEq/L (ref 19–32)
Calcium: 9 mg/dL (ref 8.4–10.5)
Chloride: 111 mEq/L (ref 96–112)
Creat: 1.27 mg/dL (ref 0.50–1.35)
Glucose, Bld: 112 mg/dL — ABNORMAL HIGH (ref 70–99)
Potassium: 4.2 mEq/L (ref 3.5–5.3)
Sodium: 143 mEq/L (ref 135–145)
Total Bilirubin: 0.6 mg/dL (ref 0.3–1.2)
Total Protein: 6.5 g/dL (ref 6.0–8.3)

## 2012-12-16 MED ORDER — OXYCODONE HCL 30 MG PO TABS: ORAL_TABLET | ORAL | Status: DC

## 2012-12-16 MED ORDER — BUPROPION HCL ER (XL) 150 MG PO TB24: 150.0000 mg | ORAL_TABLET | Freq: Every day | ORAL | Status: DC

## 2012-12-16 NOTE — Patient Instructions (Addendum)
I am starting you on wellbutrin for stress and smoking. See me iin a month. GREAT to see you!

## 2012-12-17 NOTE — Progress Notes (Signed)
  Subjective:    Patient ID: [name] [name], male    DOB: 25-Mar-1956, 57 y.o.   MRN: [id number]  HPI  #1. Followup right leg radiculopathy. It's about 10% better compared to right after surgery. He continues to use his pain medicine every 6 hours and says he really needs it that often. He has been able to get up and do more however. He's trying to get back to the [facility] and do some stationary biking. The weather has been a little bit uncooperative but otherwise he's been doing this once or twice a week. #2. His cardiologist want me to investigate his fingernail changes. He thinks he was worried about something to do with lung disease. #3. Tobacco: He has decreased from 2 and half packs per day to one pack per day. He would really like to stop. Stress is his major reason for smoking. #4. Diabetes mellitus. He continues on metformin without any problems. He's gradually trying to add more exercise and being partly successful that. He is trying to do better with his diet. No problems with his medicine. No episodes of low blood sugar. #5. Hypertension and heart disease. Denies chest pains.  when he saw his cardiologist at last visit, he realized he was having more shortness of breath with exertion than in past years. This is partly what prompted the cardiologist with his fingernails. He is not having chest pains with exertion.  Review of Systems See history of present illness above. Additionally he denies fever, sweats, chills, unusual weight change.    Objective:   Physical Exam Vital signs are reviewed GENERAL: Overweight male no acute distress. BACK: Well-healed lumbar scars. Straight leg raise positive on the right side. 5 out of 5 lower extremity strength in hip flexion. He has just a tiny bit of foot drop with his gait. The Right. PROGRESS or: Regular Rate and Rhythm No Murmur LUNGS: Clear to Auscultation ABDOMEN: Soft Positive Bowel Sounds Nontender PSYCH: Alert and Oriented x4. Very Slightly  Tearful at Couple Points to Asks and Answers Questions Appropriately.       Assessment & Plan:

## 2012-12-17 NOTE — Assessment & Plan Note (Signed)
His resting pulse ox today was 97% on room air. I looked at his fingernails and he does have some thickening but I don't think this is true clubbing. I suspect this is what the cardiologist was worried about. We discussed at some length in that transition to a discussion about smoking cessation.

## 2012-12-17 NOTE — Assessment & Plan Note (Signed)
Status post 2 lumbar surgeries. Actually he is improved from right before his first surgery. He does continue to need chronic pain medication but he is being more active and getting back to much of his everyday life. Reassured him that I think we will eventually get him off pain medicine. Hopefully as he continues to feel better he can increase his activity. I do think weight loss would be a very significant factor in improving his lower extremity pain is to take some pressure off is Ardee damaged back. I did refill his pain medicines. He has had no problems with those, I have had no request for early medication he has not tried to increase his medication. In fact he tried to take less over time with some mild success.

## 2012-12-17 NOTE — Assessment & Plan Note (Signed)
Is actually a very good control of his diabetes. He has lost a little bit of weight. I encouraged him to continue to add exercise and to monitor diet.

## 2012-12-17 NOTE — Assessment & Plan Note (Signed)
He has successfully decreased from 2 and half packs per day to one pack per day. His resting pulse ox is 97%. Long discussion about tobacco cessation. He has tried the think cigarettes, the nicotine inhaler, the patch and gum. Is not tried Chantix or Wellbutrin. Given some baseline underlying depression) suspect) I think Wellbutrin would be a great choice and we decided to try that on seeing him back in 3-4 weeks to follow this

## 2012-12-18 ENCOUNTER — Encounter: Payer: Self-pay | Admitting: Family Medicine

## 2013-01-08 ENCOUNTER — Other Ambulatory Visit: Payer: Self-pay | Admitting: Cardiology

## 2013-01-13 ENCOUNTER — Ambulatory Visit (INDEPENDENT_AMBULATORY_CARE_PROVIDER_SITE_OTHER): Payer: Medicare Other | Admitting: Family Medicine

## 2013-01-13 ENCOUNTER — Encounter: Payer: Self-pay | Admitting: Family Medicine

## 2013-01-13 VITALS — BP 140/82 | HR 84 | Temp 99.3°F | Ht 72.0 in | Wt 261.0 lb

## 2013-01-13 DIAGNOSIS — IMO0002 Reserved for concepts with insufficient information to code with codable children: Secondary | ICD-10-CM

## 2013-01-13 MED ORDER — OXYCODONE HCL 30 MG PO TABS: ORAL_TABLET | ORAL | Status: DC

## 2013-01-13 MED ORDER — ALLOPURINOL 100 MG PO TABS: 100.0000 mg | ORAL_TABLET | Freq: Every day | ORAL | Status: DC

## 2013-01-13 NOTE — Progress Notes (Signed)
  Subjective:    Patient ID: [name] [name], male    DOB: 1956-02-21, 57 y.o.   MRN: [id number]  HPI  #1. Followup starting Chantix. Thinks it's really helping his smoking. He definitely sees a decrease in his desire #2. Right shoulder pain. Went to the stores billing to bring his stationary bike back inside and lifting it he felt a strain in his right shoulder. This was about 3 weeks ago. Since then he continues to ache, about 4/10 right now. Mostly in the anterior shoulder and with certain motions such as reaching forward or backward. Has noted no arm weakness. #3. Followup radiculopathy status post 2 back surgeries. He feels like he might be another 5% better but still is using his pain medication regularly. Says he can really tell when it is time to take the next pill.  Review of Systems See history of present illness above. Additionally he denies fever, sweats, chills. No weight loss that is unintentional. He is having some baseline shortness of breath with exertion but no chest pain.    Objective:   Physical Exam  Vital signs are reviewed GENERAL: Well-developed overweight male no acute distress SHOULDER: Right. Full range of motion. Pain with supraspinatus and subscapularis testing but rotator cuff strength is intact in all planes. Nontender to palpation at the a.c. joint. Distally NV intact. LUNGS: Clear to auscultation bilaterally CARDIOVASCULAR: Regular in rhythm without murmurs  EXTREMITIES: Moving of extremities x4. Please see shoulder exam above.  INJECTION: Patient was given informed consent, signed copy in the chart. Appropriate time out was taken. Area prepped and draped in usual sterile fashion. One cc of methylprednisolone 40 mg/ml plus  4 cc of 1% lidocaine without epinephrine was injected into the right subacromial bursa using a(n) posterior approach. The patient tolerated the procedure well. There were no complications. Post procedure instructions were given.        Assessment & Plan:  1. corticosteroid injection for subacromial bursitis. Will start home exercise program. #2. Smoking cessation. He really is getting nowhere he will also stop and I think Chantix is helping. #3. Radiculopathy status post 2 back surgeries. He is very gradually improving. I think as he continues to lose weight and exercise more he will have even more improvement. I suspect last 6-8 months from now he will no longer need a chronic pain medicines. See him back in 2-3 months. They gave him 3 months prescriptions today for his pain medication.

## 2013-01-27 ENCOUNTER — Other Ambulatory Visit: Payer: Self-pay | Admitting: Family Medicine

## 2013-02-10 ENCOUNTER — Telehealth: Payer: Self-pay | Admitting: Cardiology

## 2013-03-02 ENCOUNTER — Encounter (HOSPITAL_COMMUNITY): Payer: Self-pay | Admitting: *Deleted

## 2013-03-02 ENCOUNTER — Inpatient Hospital Stay (HOSPITAL_COMMUNITY)
Admission: EM | Admit: 2013-03-02 | Discharge: 2013-03-05 | DRG: 292 | Disposition: A | Discharge status: Home / Self Care | Payer: Medicare Other | Attending: Family Medicine | Admitting: Family Medicine

## 2013-03-02 ENCOUNTER — Other Ambulatory Visit: Payer: Self-pay

## 2013-03-02 ENCOUNTER — Ambulatory Visit (HOSPITAL_COMMUNITY)
Admission: RE | Admit: 2013-03-02 | Discharge: 2013-03-02 | Disposition: A | Discharge status: Home / Self Care | Payer: Medicare Other | Source: Ambulatory Visit | Attending: Family Medicine | Admitting: Family Medicine

## 2013-03-02 ENCOUNTER — Ambulatory Visit (INDEPENDENT_AMBULATORY_CARE_PROVIDER_SITE_OTHER): Payer: Medicare Other | Admitting: Family Medicine

## 2013-03-02 ENCOUNTER — Emergency Department (HOSPITAL_COMMUNITY): Payer: Medicare Other

## 2013-03-02 ENCOUNTER — Encounter: Payer: Self-pay | Admitting: Family Medicine

## 2013-03-02 VITALS — BP 156/102 | HR 94 | Temp 99.1°F | Wt 261.0 lb

## 2013-03-02 DIAGNOSIS — Z87891 Personal history of nicotine dependence: Secondary | ICD-10-CM

## 2013-03-02 DIAGNOSIS — R0902 Hypoxemia: Secondary | ICD-10-CM | POA: Diagnosis present

## 2013-03-02 DIAGNOSIS — Z79899 Other long term (current) drug therapy: Secondary | ICD-10-CM

## 2013-03-02 DIAGNOSIS — J432 Centrilobular emphysema: Secondary | ICD-10-CM | POA: Diagnosis present

## 2013-03-02 DIAGNOSIS — E039 Hypothyroidism, unspecified: Secondary | ICD-10-CM | POA: Diagnosis present

## 2013-03-02 DIAGNOSIS — M1710 Unilateral primary osteoarthritis, unspecified knee: Secondary | ICD-10-CM | POA: Diagnosis present

## 2013-03-02 DIAGNOSIS — E118 Type 2 diabetes mellitus with unspecified complications: Secondary | ICD-10-CM

## 2013-03-02 DIAGNOSIS — I2510 Atherosclerotic heart disease of native coronary artery without angina pectoris: Secondary | ICD-10-CM | POA: Diagnosis present

## 2013-03-02 DIAGNOSIS — G4730 Sleep apnea, unspecified: Secondary | ICD-10-CM

## 2013-03-02 DIAGNOSIS — R609 Edema, unspecified: Secondary | ICD-10-CM

## 2013-03-02 DIAGNOSIS — G4733 Obstructive sleep apnea (adult) (pediatric): Secondary | ICD-10-CM | POA: Diagnosis present

## 2013-03-02 DIAGNOSIS — E1149 Type 2 diabetes mellitus with other diabetic neurological complication: Secondary | ICD-10-CM | POA: Diagnosis present

## 2013-03-02 DIAGNOSIS — J438 Other emphysema: Secondary | ICD-10-CM | POA: Diagnosis present

## 2013-03-02 DIAGNOSIS — M109 Gout, unspecified: Secondary | ICD-10-CM | POA: Diagnosis present

## 2013-03-02 DIAGNOSIS — J811 Chronic pulmonary edema: Secondary | ICD-10-CM | POA: Diagnosis present

## 2013-03-02 DIAGNOSIS — F329 Major depressive disorder, single episode, unspecified: Secondary | ICD-10-CM | POA: Diagnosis present

## 2013-03-02 DIAGNOSIS — F411 Generalized anxiety disorder: Secondary | ICD-10-CM | POA: Diagnosis present

## 2013-03-02 DIAGNOSIS — I519 Heart disease, unspecified: Secondary | ICD-10-CM | POA: Diagnosis present

## 2013-03-02 DIAGNOSIS — Z9119 Patient's noncompliance with other medical treatment and regimen: Secondary | ICD-10-CM

## 2013-03-02 DIAGNOSIS — I509 Heart failure, unspecified: Principal | ICD-10-CM | POA: Diagnosis present

## 2013-03-02 DIAGNOSIS — R0600 Dyspnea, unspecified: Secondary | ICD-10-CM

## 2013-03-02 DIAGNOSIS — M5410 Radiculopathy, site unspecified: Secondary | ICD-10-CM | POA: Diagnosis present

## 2013-03-02 DIAGNOSIS — N289 Disorder of kidney and ureter, unspecified: Secondary | ICD-10-CM | POA: Diagnosis present

## 2013-03-02 DIAGNOSIS — E669 Obesity, unspecified: Secondary | ICD-10-CM | POA: Diagnosis present

## 2013-03-02 DIAGNOSIS — Z91199 Patient's noncompliance with other medical treatment and regimen due to unspecified reason: Secondary | ICD-10-CM

## 2013-03-02 DIAGNOSIS — I10 Essential (primary) hypertension: Secondary | ICD-10-CM | POA: Diagnosis present

## 2013-03-02 DIAGNOSIS — Z6835 Body mass index (BMI) 35.0-35.9, adult: Secondary | ICD-10-CM

## 2013-03-02 DIAGNOSIS — R600 Localized edema: Secondary | ICD-10-CM

## 2013-03-02 DIAGNOSIS — G909 Disorder of the autonomic nervous system, unspecified: Secondary | ICD-10-CM | POA: Diagnosis present

## 2013-03-02 DIAGNOSIS — F3289 Other specified depressive episodes: Secondary | ICD-10-CM | POA: Diagnosis present

## 2013-03-02 DIAGNOSIS — IMO0002 Reserved for concepts with insufficient information to code with codable children: Secondary | ICD-10-CM | POA: Diagnosis present

## 2013-03-02 DIAGNOSIS — I4460 Unspecified fascicular block: Secondary | ICD-10-CM | POA: Diagnosis present

## 2013-03-02 DIAGNOSIS — E785 Hyperlipidemia, unspecified: Secondary | ICD-10-CM | POA: Diagnosis present

## 2013-03-02 DIAGNOSIS — Z96659 Presence of unspecified artificial knee joint: Secondary | ICD-10-CM

## 2013-03-02 DIAGNOSIS — R0602 Shortness of breath: Secondary | ICD-10-CM

## 2013-03-02 HISTORY — DX: Other ill-defined heart diseases: I51.89

## 2013-03-02 HISTORY — DX: Disorder of kidney and ureter, unspecified: N28.9

## 2013-03-02 HISTORY — DX: Heart failure, unspecified: I50.9

## 2013-03-02 LAB — CBC WITH DIFFERENTIAL/PLATELET
Basophils Absolute: 0 10*3/uL (ref 0.0–0.1)
Basophils Relative: 1 % (ref 0–1)
Eosinophils Relative: 2 % (ref 0–5)
Lymphocytes Relative: 22 % (ref 12–46)
MCHC: 35.1 g/dL (ref 30.0–36.0)
MCV: 79.3 fL (ref 78.0–100.0)
Neutro Abs: 3.7 10*3/uL (ref 1.7–7.7)
Platelets: 130 10*3/uL — ABNORMAL LOW (ref 150–400)
RDW: 16.1 % — ABNORMAL HIGH (ref 11.5–15.5)
WBC: 5.4 10*3/uL (ref 4.0–10.5)

## 2013-03-02 LAB — PRO B NATRIURETIC PEPTIDE: Pro B Natriuretic peptide (BNP): 509.1 pg/mL — ABNORMAL HIGH (ref 0–125)

## 2013-03-02 LAB — BASIC METABOLIC PANEL
BUN: 14 mg/dL (ref 6–23)
Calcium: 8.7 mg/dL (ref 8.4–10.5)
Creatinine, Ser: 1.3 mg/dL (ref 0.50–1.35)
GFR calc Af Amer: 69 mL/min — ABNORMAL LOW (ref >90)

## 2013-03-02 LAB — GLUCOSE, CAPILLARY: Glucose-Capillary: 99 mg/dL (ref 70–99)

## 2013-03-02 LAB — TROPONIN I: Troponin I: <0.3 ng/mL (ref <0.30)

## 2013-03-02 MED ORDER — BUPROPION HCL ER (XL) 150 MG PO TB24
150.0000 mg | ORAL_TABLET | Freq: Every day | ORAL | Status: DC
  Administered 2013-03-03 – 2013-03-05 (×3): 150 mg via ORAL
  Filled 2013-03-02 (×3): qty 1

## 2013-03-02 MED ORDER — INSULIN ASPART 100 UNIT/ML ~~LOC~~ SOLN
0.0000 [IU] | Freq: Three times a day (TID) | SUBCUTANEOUS | Status: DC
  Administered 2013-03-03 – 2013-03-04 (×3): 1 [IU] via SUBCUTANEOUS
  Administered 2013-03-04 – 2013-03-05 (×2): 2 [IU] via SUBCUTANEOUS
  Administered 2013-03-05: 1 [IU] via SUBCUTANEOUS

## 2013-03-02 MED ORDER — ALBUTEROL SULFATE (5 MG/ML) 0.5% IN NEBU
5.0000 mg | INHALATION_SOLUTION | Freq: Once | RESPIRATORY_TRACT | Status: AC
  Administered 2013-03-02: 5 mg via RESPIRATORY_TRACT
  Filled 2013-03-02: qty 1

## 2013-03-02 MED ORDER — SODIUM CHLORIDE 0.9 % IV SOLN: 250.0000 mL | INTRAVENOUS | Status: DC | PRN

## 2013-03-02 MED ORDER — HEPARIN SODIUM (PORCINE) 5000 UNIT/ML IJ SOLN
5000.0000 [IU] | Freq: Three times a day (TID) | INTRAMUSCULAR | Status: DC
  Administered 2013-03-03 – 2013-03-05 (×7): 5000 [IU] via SUBCUTANEOUS
  Filled 2013-03-02 (×10): qty 1

## 2013-03-02 MED ORDER — BENAZEPRIL HCL 40 MG PO TABS
40.0000 mg | ORAL_TABLET | Freq: Every day | ORAL | Status: DC
  Administered 2013-03-03 – 2013-03-05 (×3): 40 mg via ORAL
  Filled 2013-03-02 (×4): qty 1

## 2013-03-02 MED ORDER — SODIUM CHLORIDE 0.9 % IJ SOLN: 3.0000 mL | INTRAMUSCULAR | Status: DC | PRN

## 2013-03-02 MED ORDER — METOPROLOL TARTRATE 100 MG PO TABS
100.0000 mg | ORAL_TABLET | Freq: Two times a day (BID) | ORAL | Status: DC
  Administered 2013-03-03 – 2013-03-05 (×6): 100 mg via ORAL
  Filled 2013-03-02 (×7): qty 1

## 2013-03-02 MED ORDER — ACETAMINOPHEN 325 MG PO TABS: 650.0000 mg | ORAL_TABLET | ORAL | Status: DC | PRN

## 2013-03-02 MED ORDER — IPRATROPIUM BROMIDE 0.02 % IN SOLN
0.5000 mg | Freq: Once | RESPIRATORY_TRACT | Status: AC
  Administered 2013-03-02: 0.5 mg via RESPIRATORY_TRACT
  Filled 2013-03-02: qty 2.5

## 2013-03-02 MED ORDER — DOXAZOSIN MESYLATE 8 MG PO TABS
8.0000 mg | ORAL_TABLET | Freq: Every day | ORAL | Status: DC
  Administered 2013-03-03 – 2013-03-05 (×3): 8 mg via ORAL
  Filled 2013-03-02 (×3): qty 1

## 2013-03-02 MED ORDER — POTASSIUM CHLORIDE CRYS ER 20 MEQ PO TBCR
20.0000 meq | EXTENDED_RELEASE_TABLET | Freq: Two times a day (BID) | ORAL | Status: AC
  Administered 2013-03-03 (×2): 20 meq via ORAL
  Filled 2013-03-02 (×2): qty 1

## 2013-03-02 MED ORDER — HYDRALAZINE HCL 50 MG PO TABS
100.0000 mg | ORAL_TABLET | Freq: Three times a day (TID) | ORAL | Status: DC
  Administered 2013-03-03 – 2013-03-05 (×8): 100 mg via ORAL
  Filled 2013-03-02 (×10): qty 2

## 2013-03-02 MED ORDER — OXYCODONE HCL 5 MG PO TABS
30.0000 mg | ORAL_TABLET | Freq: Four times a day (QID) | ORAL | Status: DC | PRN
  Administered 2013-03-03 – 2013-03-05 (×10): 30 mg via ORAL
  Filled 2013-03-02 (×10): qty 6

## 2013-03-02 MED ORDER — ALLOPURINOL 100 MG PO TABS
100.0000 mg | ORAL_TABLET | Freq: Every day | ORAL | Status: DC
  Administered 2013-03-03 – 2013-03-05 (×3): 100 mg via ORAL
  Filled 2013-03-02 (×3): qty 1

## 2013-03-02 MED ORDER — SODIUM CHLORIDE 0.9 % IJ SOLN
3.0000 mL | Freq: Two times a day (BID) | INTRAMUSCULAR | Status: DC
  Administered 2013-03-03 – 2013-03-05 (×5): 3 mL via INTRAVENOUS

## 2013-03-02 MED ORDER — ONDANSETRON HCL 4 MG/2ML IJ SOLN: 4.0000 mg | Freq: Four times a day (QID) | INTRAMUSCULAR | Status: DC | PRN

## 2013-03-02 MED ORDER — SIMVASTATIN 20 MG PO TABS
20.0000 mg | ORAL_TABLET | Freq: Every day | ORAL | Status: DC
Start: 2013-03-03 — End: 2013-03-05
  Administered 2013-03-03 – 2013-03-05 (×3): 20 mg via ORAL
  Filled 2013-03-02 (×3): qty 1

## 2013-03-02 MED ORDER — IOHEXOL 350 MG/ML SOLN
100.0000 mL | Freq: Once | INTRAVENOUS | Status: AC | PRN
  Administered 2013-03-02: 100 mL via INTRAVENOUS

## 2013-03-02 MED ORDER — AMLODIPINE BESYLATE 10 MG PO TABS
10.0000 mg | ORAL_TABLET | ORAL | Status: DC
  Administered 2013-03-03 – 2013-03-05 (×3): 10 mg via ORAL
  Filled 2013-03-02 (×4): qty 1

## 2013-03-02 MED ORDER — FUROSEMIDE 10 MG/ML IJ SOLN
60.0000 mg | Freq: Once | INTRAMUSCULAR | Status: AC
  Administered 2013-03-02: 60 mg via INTRAVENOUS
  Filled 2013-03-02: qty 6

## 2013-03-02 NOTE — Progress Notes (Addendum)
Subjective:     Patient ID: CRU KRITIKOS, male   DOB: 02-26-1956, 57 y.o.   MRN: [id number]  CC - Shortness of breath  HPI  [name] [name] is a 57 y.o. male with h/o CHF, DM, HLD, HTN, sleep apnea, tobacco dependence, and obesity here with shortness of breath. [name] [name] states that his wife came from the hospital 2 weeks ago and since then he has been caring less for himself and over exerting himself. He has had some shortness of breath since then but 2 days ago started feeling increasingly short of breath "all the time" with paroxysmal nocturnal dyspnea, having to sit straight up to sleep and not getting good sleep, feeling increased edema on his legs and abdomen, and reporting ~10 lb weight gain (249 lbs 2 weeks ago measured by [facility] with no clothes on, and 261lbs today with clothes on, so likely still 259lbs). Also reports mild cough, increased fatigue, and 6/10 back and leg pain (baseline back pain that feels worsened). He does not use oxygen at home but on arrival to clinic was very short of breath.  Denies chest pain, dizziness, syncope, fever, chills, headache.   Patient thinks his diet has been pretty steady avoiding high salt foods, but does report being unsure of furosemide dosing but taking less than prescribed to avoid having to use bathroom so much during the day, especially with recent demands on his time to go get medications for his wife. He thinks the recent stressor of caring for wife and the recent weather changes are worsening his symptoms. He also reports not using CPAP for 8 months and finding out recently when he tried to use it that it is broken.  Of note, recent echo with LVH and mild L atrial dilation but EF 55-60%. TSH 2012 ~2.8.  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   Review of Systems Per HPI  PMH: Past Medical History   Diagnosis Date  . Renal insufficiency, mild   . OA (osteoarthritis) of knee     right; mod-severe  . DM (diabetes mellitus), type 2     mild mod sensory loss plantar feet  . OA (osteoarthritis)   . History of colonoscopy   . Peripheral autonomic neuropathy due to secondary diabetes   . Hypothyroidism     since thyroidectomy  . Anxiety     takes buspar  . Sleep apnea     on cpap  . Complication of anesthesia     pt. reports he "flat lined " during surgery in  1999, told that  he had to be revived .  Pt. reports that it was surg. to replace his knee & [facility] [place].  [facility] to [facility]- no record avail. on this pt.   Marland Kitchen HTN (hypertension)     labile, followed by Dr. Jens Som   SH:  Denies alcohol or drug use Smokes <1ppd, used to smoke 2 ppd but working on cessation with PCP. Cares for wife who has recently been sick and hospitalized; helps with three children     Objective:   Physical Exam BP 156/102  Pulse 94  Temp(Src) 99.1 F (37.3 C) (Oral)  Wt 261 lb (118.389 kg)  BMI 35.39 kg/m2  SpO2 90%   Oxygen saturations: 86% on 2L O2 by [place] 90% on 3L O2 76%  upon ambulation with 3L O2 75% with 30 minute trial off O2.  CV: RRR, II/VI systolic murmur PULM: CTAB with no crackles or wheezes and normal effort on 3L O2, increased WOB on no oxygen, very fatigued appearing when walks ABD: Soft, mildly distended, NABS, no organomegaly palpated, obese, no pitting edema EXTR: 1-2+ pitting edema bilateral LE up to mid-shin; Bilateral fingernails darkened HEENT: Sclera mildly erythematous, EOMI NEURO: Awake, alert, no focal deficit    Assessment:     [name] [name] is a 57 y.o. male with h/o CHF, DM, HLD, HTN, sleep apnea, tobacco dependence, and obesity here with shortness of breath concerning for PE vs CHF exacerbation. Also possibly sleep apnea (?cor pulmonale) without using CPAP. No documented COPD history. Patient initially very much wanted to  go home. However, after trial off oxygen yielded patient having increased WOB after feeling "comfortable" on oxygen, along with fatigue and O2 sat dropping to 75%, ED evaluation seems most appropriate. This desaturation is a new problem for this patient, who at a recent previous office visit was satting 97% on room air.    Plan:     -  Plan for transport by [facility] to ED on O2. Workup up to ED physician, but consider:   - Check basic metabolic panel to evaluate renal function. Baseline within last year seems to be around 1.3.   - Check troponin to evaluate cardiac strain.   - Consider d-dimer vs LE duplex US vs CTA chest to evaluate risk for PE  - Chest x-ray to evaluate for fluid overload vs pneumonia (unlikely)  - Check BNP to evaluate for heart failure  - Consider repeat echo if any signs of cardiac insult. Otherwise, likely can refer to echo performed 11/2012.      I spent >50% of a 40 minute office visit on counseling and coordination of care.

## 2013-03-02 NOTE — Patient Instructions (Signed)
Pt being transported by [facility] to [facility] ED.

## 2013-03-02 NOTE — ED Provider Notes (Signed)
History     CSN: [id number]  Arrival date & time 03/02/13  1652   First MD Initiated Contact with Patient 03/02/13 1703      Chief Complaint  Patient presents with  . Shortness of Breath     HPI Pt was seen at 1710.   Per pt, c/o gradual onset and persistence of constant SOB for the past 2 weeks, worse over the past 2 days. SOB worsens with laying flat and ambulation.  Pt states he has been "sleeping sitting up" for the past several days.  Pt states he has not been taking his lasix for the past 9 days because he "doesn't want to go to the bathroom all the time." Pt was eval by his PMD today with Sats in 70's.  Pt was then sent to the ED for further eval/admit.  Pt states he has a CPAP at home to wear qhs for OSA, but states has not used it "in a long time." Pt denies CP/palpitations, no cough, no abd pain, no N/V/D, no back pain, no fevers.     Past Medical History  Diagnosis Date  . Renal insufficiency, mild   . OA (osteoarthritis) of knee     right; mod-severe  . DM (diabetes mellitus), type 2     mild mod sensory loss plantar feet  . OA (osteoarthritis)   . History of colonoscopy   . Peripheral autonomic neuropathy due to secondary diabetes   . Hypothyroidism     since thyroidectomy  . Anxiety     takes buspar  . Sleep apnea     on cpap  . Complication of anesthesia     pt. reports he "flat lined " during surgery in  1999, told that  he had to be revived .  Pt. reports that it was surg. to replace his knee & performed [facility] [place].  [facility] to [facility]- no record avail. on this pt.   Marland Kitchen HTN (hypertension)     labile, followed by Dr. Jens Som  . Diastolic dysfunction     Past Surgical History  Procedure Laterality Date  . Total knee arthroplasty  2001    left  . Thyroidectomy, partial      remote  . Cardiolite--no cad ef 34%  01/01/2005  . Joint replacement  2000    left knee   . Lumbar laminectomy/decompression microdiscectomy  09/25/2011     Procedure: LUMBAR LAMINECTOMY/DECOMPRESSION MICRODISCECTOMY;  Surgeon: Karn Cassis;  Location: [facility];  Service: Neurosurgery;  Laterality: Right;  Right Lumbar Five-Sacral One Microdiscectomy  . Back surgery      11/12  . Lumbar laminectomy/decompression microdiscectomy  01/03/2012    Procedure: LUMBAR LAMINECTOMY/DECOMPRESSION MICRODISCECTOMY;  Surgeon: Karn Cassis, MD;  Location: [facility];  Service: Neurosurgery;  Laterality: Right;  Right L5-S1 Redo Diskectomy    Family History  Problem Relation Age of Onset  . Diabetes Mother   . Hypertension Mother   . Aneurysm Mother   . Anesthesia problems Neg Hx   . Hypotension Neg Hx   . Malignant hyperthermia Neg Hx   . Pseudochol deficiency Neg Hx     History  Substance Use Topics  . Smoking status: Former Smoker -- 1.00 packs/day for 28 years    Types: Cigarettes    Quit date: 02/23/2013  . Smokeless tobacco: Never Used     Comment: wants to get back pain under control then wants to discuss  . Alcohol Use:  No      Review of Systems ROS: Statement: All systems negative except as marked or noted in the HPI; Constitutional: Negative for fever and chills. ; ; Eyes: Negative for eye pain, redness and discharge. ; ; ENMT: Negative for ear pain, hoarseness, nasal congestion, sinus pressure and sore throat. ; ; Cardiovascular: Negative for chest pain, palpitations, diaphoresis, and peripheral edema. ; ; Respiratory: +SOB. Negative for cough, wheezing and stridor. ; ; Gastrointestinal: Negative for nausea, vomiting, diarrhea, abdominal pain, blood in stool, hematemesis, jaundice and rectal bleeding. . ; ; Genitourinary: Negative for dysuria, flank pain and hematuria. ; ; Musculoskeletal: Negative for back pain and neck pain. Negative for swelling and trauma.; ; Skin: Negative for pruritus, rash, abrasions, blisters, bruising and skin lesion.; ; Neuro: Negative for headache, lightheadedness and neck stiffness. Negative for  weakness, altered level of consciousness , altered mental status, extremity weakness, paresthesias, involuntary movement, seizure and syncope.       Allergies  Shellfish allergy  Home Medications   Current Outpatient Rx  Name  Route  Sig  Dispense  Refill  . allopurinol (ZYLOPRIM) 100 MG tablet   Oral   Take 1 tablet (100 mg total) by mouth daily.   90 tablet   3   . amLODipine (NORVASC) 10 MG tablet   Oral   Take 1 tablet (10 mg total) by mouth every morning.   30 tablet   12   . benazepril (LOTENSIN) 40 MG tablet   Oral   Take 1 tablet (40 mg total) by mouth daily.   30 tablet   12   . buPROPion (WELLBUTRIN XL) 150 MG 24 hr tablet   Oral   Take 1 tablet (150 mg total) by mouth daily.   30 tablet   3   . doxazosin (CARDURA) 8 MG tablet   Oral   Take 1 tablet (8 mg total) by mouth daily.   30 tablet   12   . furosemide (LASIX) 80 MG tablet   Oral   Take 0.5-1 tablets (40-80 mg total) by mouth daily as needed. for lower extremity edema   30 tablet   12   . hydrALAZINE (APRESOLINE) 100 MG tablet   Oral   Take 100 mg by mouth 3 (three) times daily.         . metFORMIN (GLUCOPHAGE) 1000 MG tablet   Oral   Take 1,000 mg by mouth 2 (two) times daily with a meal.         . metoprolol (LOPRESSOR) 100 MG tablet   Oral   Take 1 tablet (100 mg total) by mouth 2 (two) times daily.   180 tablet   3   . oxycodone (ROXICODONE) 30 MG immediate release tablet      Take one tablet every 6 hours as needed for back pain. Do not fill before sixty days from the date of this prescription   120 tablet   0   . potassium chloride (K-DUR) 10 MEQ tablet   Oral   Take 1 tablet (10 mEq total) by mouth daily.   30 tablet   12   . simvastatin (ZOCOR) 20 MG tablet   Oral   Take 1 tablet (20 mg total) by mouth daily.   90 tablet   3     BP 152/91  Pulse 81  Temp(Src) 98.5 F (36.9 C) (Oral)  Resp 16  Ht 6' (1.829 m)  Wt 261 lb (118.389 kg)  BMI 35.39 kg/m2  SpO2 93%  Physical Exam 1715: Physical examination:  Nursing notes reviewed; Vital signs and O2 SAT reviewed;  Constitutional: Well developed, Well nourished, Well hydrated, In no acute distress; Head:  Normocephalic, atraumatic; Eyes: EOMI, PERRL, No scleral icterus; ENMT: Mouth and pharynx normal, Mucous membranes moist; Neck: Supple, Full range of motion, No lymphadenopathy; Cardiovascular: Regular rate and rhythm, No gallop; Respiratory: Breath sounds coarse & equal bilaterally, No wheezes.  Speaking full sentences, Normal respiratory effort/excursion; Chest: Nontender, Movement normal; Abdomen: Soft, Nontender, Nondistended, Normal bowel sounds; Genitourinary: No CVA tenderness; Extremities: Pulses normal, No tenderness, No edema, No calf edema or asymmetry.; Neuro: AA&Ox3, Major CN grossly intact.  Speech clear. No gross focal motor or sensory deficits in extremities.; Skin: Color normal, Warm, Dry.   ED Course  Procedures   MDM  MDM Reviewed: previous chart, nursing note and vitals Reviewed previous: labs and ECG Interpretation: labs, ECG and x-ray    Date: 03/02/2013  Rate: 80  Rhythm: normal sinus rhythm  QRS Axis: normal  Intervals: PR prolonged  ST/T Wave abnormalities: normal  Conduction Disutrbances:first-degree A-V block  and left posterior fascicular block  Narrative Interpretation:   Old EKG Reviewed: unchanged; no significant changes from previous EKG dated 10/27/2012.  Results for orders placed during the hospital encounter of 03/02/13  TROPONIN I      Result Value Range   Troponin I <0.30  <0.30 ng/mL  BASIC METABOLIC PANEL      Result Value Range   Sodium 142  135 - 145 mEq/L   Potassium 4.2  3.5 - 5.1 mEq/L   Chloride 109  96 - 112 mEq/L   CO2 24  19 - 32 mEq/L   Glucose, Bld 104 (*) 70 - 99 mg/dL   BUN 14  6 - 23 mg/dL   Creatinine, Ser 2.95  0.50 - 1.35 mg/dL   Calcium 8.7  8.4 - 62.1 mg/dL   GFR calc non Af Amer 60 (*) >90 mL/min   GFR calc Af Amer  69 (*) >90 mL/min  CBC WITH DIFFERENTIAL      Result Value Range   WBC 5.4  4.0 - 10.5 K/uL   RBC 5.56  4.22 - 5.81 MIL/uL   Hemoglobin 15.5  13.0 - 17.0 g/dL   HCT 30.8  65.7 - 84.6 %   MCV 79.3  78.0 - 100.0 fL   MCH 27.9  26.0 - 34.0 pg   MCHC 35.1  30.0 - 36.0 g/dL   RDW 96.2 (*) 95.2 - 84.1 %   Platelets 130 (*) 150 - 400 K/uL   Neutrophils Relative 67  43 - 77 %   Neutro Abs 3.7  1.7 - 7.7 K/uL   Lymphocytes Relative 22  12 - 46 %   Lymphs Abs 1.2  0.7 - 4.0 K/uL   Monocytes Relative 8  3 - 12 %   Monocytes Absolute 0.4  0.1 - 1.0 K/uL   Eosinophils Relative 2  0 - 5 %   Eosinophils Absolute 0.1  0.0 - 0.7 K/uL   Basophils Relative 1  0 - 1 %   Basophils Absolute 0.0  0.0 - 0.1 K/uL  PRO B NATRIURETIC PEPTIDE      Result Value Range   Pro B Natriuretic peptide (BNP) 509.1 (*) 0 - 125 pg/mL  D-DIMER, QUANTITATIVE      Result Value Range   D-Dimer, Quant 0.66 (*) 0.00 - 0.48 ug/mL-FEU   Dg Chest 2 View 03/02/2013  *RADIOLOGY REPORT*  Clinical Data: Shortness of breath.  CHEST - 2 VIEW  Comparison: 09/11/2011 03/27/2011 chest radiographs  Findings: The cardiomediastinal silhouette is unremarkable. Pulmonary vascular congestion is identified. Mild interstitial opacities are suspicious for interstitial edema. Mild bibasilar atelectasis is noted. Elevation of the right hemidiaphragm is again noted. There is no evidence of pneumothorax or large pleural effusion.  IMPRESSION: Pulmonary vascular congestion with probable mild interstitial edema.  Mild basilar atelectasis.   Original Report Authenticated By: Harmon Pier, M.D.    Results for CORDARREL, STIEFEL (MRN [id number]) as of 03/02/2013 19:53  Ref. Range 09/20/2011 10:35 09/25/2011 10:42 10/21/2011 11:21 01/02/2012 09:48 03/02/2013 17:49  Platelets Latest Range: 150-400 K/uL PLATELET CLUMPS NOTED ON SMEAR, COUNT APPEARS DECREASED 101 (L) 135 (L) 129 (L) 130 (L)     1925:  Neb given without change in assessment.  O2 Sats remain low 90's  on O2 3L N/C.  Platelet count per baseline. CXR with pulm vascular congestion but BNP only mildly elevated. Will need Echocardiogram as inpatient to clarify (previous Echo 11/2012 with EF 55%). T/C to [facility] Resident, case discussed, including:  HPI, pertinent PM/SHx, VS/PE, dx testing, ED course and treatment:  They are aware of pt and are agreeable to admit, requests they will come to ED for eval.  Aware CT-A chest pending.            Laray Anger, DO 03/04/13 1742

## 2013-03-02 NOTE — Progress Notes (Signed)
Patient came into office with wife.  C/o shortness of breath x 2 days.  Has CPAP, but states he "has not used it in a long time."  Denies any chest pain, but has back and right calf pain.  Hx back surgery x 2.  Checked O2 sat--80-81% on RA.  Stated O2--2L per [place] and O2 sat--86%.  Increased to 3L per [place] and O2 sat---90%.  Will work patient in with Dr. Benjamin Stain for evaluation.  Gaylene Brooks, RN

## 2013-03-02 NOTE — ED Notes (Signed)
Pt brought here by [facility] from [facility] for sob r/t chf.  Pt has not been taking lasix x 9 days b/c his wife has been in the hospital and he doesn't want to go to the bathroom all the time.  When [facility] arrived pt with sats in 70's.  Placed on 4 L and O2 increased to 86%.  Denies chest pain, nausea, just sob.

## 2013-03-02 NOTE — Assessment & Plan Note (Addendum)
Shortness of breath and hypoxemia are concerning for PE vs CHF exacerbation. Also possibly sleep apnea (?cor pulmonale) without using CPAP. No documented COPD history. Patient initially very much wanted to go home. However, after trial off oxygen yielded patient having increased WOB after feeling "comfortable" on oxygen, along with fatigue and O2 sat dropping to 75%, ED evaluation seems most appropriate. This desaturation is a new problem for this patient, who at a recent previous office visit was satting 97% on room air. - Plan for transport by [facility] to ED on O2. Workup up to ED physician, but consider:  - Check basic metabolic panel to evaluate renal function. Baseline within last year seems to be around 1.3.  - Check troponin to evaluate cardiac strain.  - Consider d-dimer vs LE duplex US vs CTA chest to evaluate risk for PE  - Chest x-ray to evaluate for fluid overload vs pneumonia (unlikely)  - Check BNP to evaluate for heart failure  - Consider repeat echo if any signs of cardiac insult. Otherwise, likely can refer to echo performed 11/2012.   I spent >50% of a 40 minute office visit on counseling and coordination of care.

## 2013-03-02 NOTE — H&P (Signed)
Family Medicine Teaching [name] Admission History and Physical  Patient name: [name] Medical record number: [id number] Date of birth: [birth year] Age: 57 y.o. Gender: male  Primary Care Provider: Denny Levy, MD  Chief Complaint: Dyspnea History of Present Illness: [name] [name] is a 57 y.o. year old male with DM, HLD, HTN, sleep apnea, tobacco dependence, and obesity here with worsening shortness of breath. Mr Deshmukh states that his wife came from the hospital 2 weeks ago and since then he has been caring less for himself and over exerting himself. He had been sleeping poorly, so he stopped taking furosemide to prevent having to wake up to urinate. It has been about 9 days since he stopped taking furosemide. He has had some shortness of breath since then but 2 days ago started feeling increasingly short of breath "all the time" with paroxysmal nocturnal dyspnea,orthopena, feeling increased edema on his legs and abdomen, and reporting ~10 lb weight gain (249 lbs 2 weeks ago measured by [name] [name] with no clothes on, and 261lbs today with clothes on, so likely still 259lbs). Also reports mild cough, increased fatigue, and 6/10 back and leg pain (baseline back pain that feels worsened).   He presented to clinic today and was found to be hypoxemic to 75% on room air while ambulating. Therefore, he was sent to the ED for further evaluation. He denies a history of CHF and recent echo in January 2014 showed EF of 55% with severe LVH. He has > 30 pack year history of smoking but stopped 3 weeks ago. He denies ever being  needing oxygen before today. He has sleep apnea, but was is not sure if his CPAP machine is working. He denies any history of blood blot. He denies chest pain, fever, chills, abdominal pain, nausea, vomiting, diarrhea or constipation.   Patient Active Problem List  Diagnosis  . Unspecified hypothyroidism  . DIABETES MELLITUS, CONTROLLED, WITH COMPLICATIONS  . HYPERLIPIDEMIA  .  GOUT, ACUTE  . OBESITY, NOS  . TOBACCO DEPENDENCE  . DEPRESSIVE DISORDER, NOS  . CORONARY, ARTERIOSCLEROSIS  . OSTEOARTHRITIS  . APNEA, SLEEP  . HYPERTENSION, BENIGN SYSTEMIC  . Renal insufficiency  . Radicular low back pain  . Hypoxemia   Past Medical History: Past Medical History  Diagnosis Date  . Renal insufficiency, mild   . OA (osteoarthritis) of knee     right; mod-severe  . DM (diabetes mellitus), type 2     mild mod sensory loss plantar feet  . OA (osteoarthritis)   . History of colonoscopy   . Peripheral autonomic neuropathy due to secondary diabetes   . Hypothyroidism     since thyroidectomy  . Anxiety     takes buspar  . Sleep apnea     on cpap  . Complication of anesthesia     pt. reports he "flat lined " during surgery in  1999, told that  he had to be revived .  Pt. reports that it was surg. to replace his knee & performed at [name]  on [place].  Call to [name]- no record avail. on this pt.   Marland Kitchen HTN (hypertension)     labile, followed by Dr. Jens Som  . Diastolic dysfunction   . Renal insufficiency     Past Surgical History: Past Surgical History  Procedure Laterality Date  . Total knee arthroplasty  2001    left  . Thyroidectomy, partial      remote  . Cardiolite--no cad ef 34%  01/01/2005  . Joint replacement  2000    left knee   . Lumbar laminectomy/decompression microdiscectomy  09/25/2011    Procedure: LUMBAR LAMINECTOMY/DECOMPRESSION MICRODISCECTOMY;  Surgeon: Karn Cassis;  Location: [facility];  Service: Neurosurgery;  Laterality: Right;  Right Lumbar Five-Sacral One Microdiscectomy  . Back surgery      11/12  . Lumbar laminectomy/decompression microdiscectomy  01/03/2012    Procedure: LUMBAR LAMINECTOMY/DECOMPRESSION MICRODISCECTOMY;  Surgeon: Karn Cassis, MD;  Location: [facility];  Service: Neurosurgery;  Laterality: Right;  Right L5-S1 Redo Diskectomy    Social History: History   Social History  .  Marital Status: Married    Spouse Name: [name] [name]    Number of Children: 4  . Years of Education: some colle   Occupational History  . Retired-printing    Social History Main Topics  . Smoking status: Former Smoker -- 1.00 packs/day for 28 years    Types: Cigarettes    Quit date: 02/23/2013  . Smokeless tobacco: Never Used     Comment: wants to get back pain under control then wants to discuss  . Alcohol Use: No  . Drug Use: No  . Sexually Active: None   Other Topics Concern  . None   Social History Narrative   Health Care POA:    Emergency Contact: wife, [name] [name] [phone]   End of Life Plan:    Who lives with you: wife   Any pets: none   Diet: Pt has a varied diet of protein, starch, and vegetables.    Exercise: Pt reports exercising 1-3x a week at [facility].   Seatbelts: Pt reports wearing seatbelt when in vehicles.    Hobbies: fishing with cousin    Family History: Family History  Problem Relation Age of Onset  . Diabetes Mother   . Hypertension Mother   . Aneurysm Mother   . Anesthesia problems Neg Hx   . Hypotension Neg Hx   . Malignant hyperthermia Neg Hx   . Pseudochol deficiency Neg Hx     Allergies: Allergies  Allergen Reactions  . Shellfish Allergy Other (See Comments)    Reaction unknown    No current facility-administered medications for this encounter.   Current Outpatient Prescriptions  Medication Sig Dispense Refill  . allopurinol (ZYLOPRIM) 100 MG tablet Take 1 tablet (100 mg total) by mouth daily.  90 tablet  3  . amLODipine (NORVASC) 10 MG tablet Take 1 tablet (10 mg total) by mouth every morning.  30 tablet  12  . benazepril (LOTENSIN) 40 MG tablet Take 1 tablet (40 mg total) by mouth daily.  30 tablet  12  . buPROPion (WELLBUTRIN XL) 150 MG 24 hr tablet Take 1 tablet (150 mg total) by mouth daily.  30 tablet  3  . doxazosin (CARDURA) 8 MG tablet Take 1 tablet (8 mg total) by mouth daily.  30 tablet  12  . furosemide (LASIX) 80 MG tablet  Take 0.5-1 tablets (40-80 mg total) by mouth daily as needed. for lower extremity edema  30 tablet  12  . hydrALAZINE (APRESOLINE) 100 MG tablet Take 100 mg by mouth 3 (three) times daily.      . metFORMIN (GLUCOPHAGE) 1000 MG tablet Take 1,000 mg by mouth 2 (two) times daily with a meal.      . metoprolol (LOPRESSOR) 100 MG tablet Take 1 tablet (100 mg total) by mouth 2 (two) times daily.  180 tablet  3  . oxycodone (ROXICODONE) 30 MG immediate release tablet  Take one tablet every 6 hours as needed for back pain. Do not fill before sixty days from the date of this prescription  120 tablet  0  . potassium chloride (K-DUR) 10 MEQ tablet Take 1 tablet (10 mEq total) by mouth daily.  30 tablet  12  . simvastatin (ZOCOR) 20 MG tablet Take 1 tablet (20 mg total) by mouth daily.  90 tablet  3   Review Of Systems: Per HPI with the following additions: none Otherwise 12 point review of systems was performed and was unremarkable.  Physical Exam: Temp:  [98.5 F (36.9 C)-99.1 F (37.3 C)] 98.5 F (36.9 C) (04/22 1856) Pulse Rate:  [75-94] 80 (04/22 2245) Resp:  [14-20] 15 (04/22 2245) BP: (148-163)/(91-104) 148/93 mmHg (04/22 2245) SpO2:  [80 %-97 %] 93 % (04/22 2245) Weight:  [261 lb (118.389 kg)] 261 lb (118.389 kg) (04/22 1721)   General: alert, cooperative and mild distress, chronically ill appearing, obese, pleasant and conversant HEENT: PERRLA, extra ocular movement intact, sclera clear, anicteric and oropharynx clear, no lesions Heart: S1, S2 normal, no murmur, rub or gallop, regular rate and rhythm Lungs: increased work of breathing, able to speak in full sentences, slight crackles in bases bilaterally but otherwise clear to auscultation Abdomen: abdomen is soft without significant tenderness, masses, organomegaly or guarding Extremities: edema 1+ to ankles bilaterally Skin:no rashes, no ecchymoses Neurology: normal without focal findings, mental status, speech normal, alert and oriented  x3, PERLA and reflexes normal and symmetric  Labs and Imaging:  Results for orders placed during the hospital encounter of 03/02/13 (from the past 24 hour(s))  TROPONIN I     Status: None   Collection Time    03/02/13  5:48 PM      Result Value Range   Troponin I <0.30  <0.30 ng/mL  PRO B NATRIURETIC PEPTIDE     Status: Abnormal   Collection Time    03/02/13  5:48 PM      Result Value Range   Pro B Natriuretic peptide (BNP) 509.1 (*) 0 - 125 pg/mL  BASIC METABOLIC PANEL     Status: Abnormal   Collection Time    03/02/13  5:49 PM      Result Value Range   Sodium 142  135 - 145 mEq/L   Potassium 4.2  3.5 - 5.1 mEq/L   Chloride 109  96 - 112 mEq/L   CO2 24  19 - 32 mEq/L   Glucose, Bld 104 (*) 70 - 99 mg/dL   BUN 14  6 - 23 mg/dL   Creatinine, Ser 7.84  0.50 - 1.35 mg/dL   Calcium 8.7  8.4 - 69.6 mg/dL   GFR calc non Af Amer 60 (*) >90 mL/min   GFR calc Af Amer 69 (*) >90 mL/min  CBC WITH DIFFERENTIAL     Status: Abnormal   Collection Time    03/02/13  5:49 PM      Result Value Range   WBC 5.4  4.0 - 10.5 K/uL   RBC 5.56  4.22 - 5.81 MIL/uL   Hemoglobin 15.5  13.0 - 17.0 g/dL   HCT 29.5  28.4 - 13.2 %   MCV 79.3  78.0 - 100.0 fL   MCH 27.9  26.0 - 34.0 pg   MCHC 35.1  30.0 - 36.0 g/dL   RDW 44.0 (*) 10.2 - 72.5 %   Platelets 130 (*) 150 - 400 K/uL   Neutrophils Relative 67  43 - 77 %  Neutro Abs 3.7  1.7 - 7.7 K/uL   Lymphocytes Relative 22  12 - 46 %   Lymphs Abs 1.2  0.7 - 4.0 K/uL   Monocytes Relative 8  3 - 12 %   Monocytes Absolute 0.4  0.1 - 1.0 K/uL   Eosinophils Relative 2  0 - 5 %   Eosinophils Absolute 0.1  0.0 - 0.7 K/uL   Basophils Relative 1  0 - 1 %   Basophils Absolute 0.0  0.0 - 0.1 K/uL  D-DIMER, QUANTITATIVE     Status: Abnormal   Collection Time    03/02/13  5:49 PM      Result Value Range   D-Dimer, Quant 0.66 (*) 0.00 - 0.48 ug/mL-FEU    Dg Chest 2 View  03/02/2013  *RADIOLOGY REPORT*  Clinical Data: Shortness of breath.  CHEST - 2 VIEW   Comparison: 09/11/2011 03/27/2011 chest radiographs  Findings: The cardiomediastinal silhouette is unremarkable. Pulmonary vascular congestion is identified. Mild interstitial opacities are suspicious for interstitial edema. Mild bibasilar atelectasis is noted. Elevation of the right hemidiaphragm is again noted. There is no evidence of pneumothorax or large pleural effusion.  IMPRESSION: Pulmonary vascular congestion with probable mild interstitial edema.  Mild basilar atelectasis.   Original Report Authenticated By: Harmon Pier, M.D.    Ct Angio Chest Pe W/cm &/or Wo Cm  03/02/2013  *RADIOLOGY REPORT*  Clinical Data: Shortness of breath.  Rule out pulmonary embolism.  CT ANGIOGRAPHY CHEST  Technique:  Multidetector CT imaging of the chest using the standard protocol during bolus administration of intravenous contrast. Multiplanar reconstructed images including MIPs were obtained and reviewed to evaluate the vascular anatomy.  Contrast: OMNIPAQUE IOHEXOL 350 MG/ML SOLN  Comparison: Plain film of earlier in the day.  Chest CT of 12/21/2006.  Findings: Lung windows demonstrate motion degradation throughout. Moderate centrilobular emphysema.  Bibasilar volume loss.  Soft tissue windows:  The quality of this exam for evaluation of pulmonary embolism is poor.  In addition to the above described motion, the bolus is suboptimally timed, with the majority of the contrast in the SVC.  No central pulmonary embolism.  No large lobar embolism.  Low density bilateral thyroid nodules which are nonspecific.  Mild ascending aortic dilatation at 4.3 cm compared to 4.1 cm on the prior.  Moderate cardiomegaly, without pericardial effusion.  Pulmonary artery enlargement, with the outflow tract measuring 4.3 cm.  Increased number of small mediastinal nodes.  Right paratracheal adenopathy 1.7 cm on image 32/series 4. Subcarinal adenopathy 1.9 cm on image 44/series 4.  Right hilar adenopathy at 2.9 cm.  Limited abdominal imaging  demonstrates no significant findings.  No acute osseous abnormality.  IMPRESSION:  1.  Severely degraded evaluation for pulmonary embolism.  No large embolism identified. 2.  Otherwise, motion degraded exam.  Centrilobular emphysema without definite pulmonary parenchymal process. 3.  Development of thoracic adenopathy.  Although this could be reactive, lymphoproliferative process cannot be excluded.  Consider non emergent PET.  If the patient has clinical congestive heart failure, short-term follow-up after appropriate diuresis could exclude adenopathy secondary to congestive failure. 4. Pulmonary artery enlargement suggests pulmonary arterial hypertension. 5.  Mild ascending aortic ectasia.  The patient could not be rescanned secondary to renal insufficiency.   Original Report Authenticated By: Jeronimo Greaves, M.D.       Assessment and Plan: RONDRICK BARREIRA is a 57 y.o. year old male presenting with hypoxemia and dyspnea concerning for a CHF exacerbation.   # Dyspea/Hypoxemia -  Most likely with CHF exacerbation given history of non compliance with Lasix and pulm edema on chest X-ray, but symptoms seem out of proportion of recent echo or rest of physical exam; Pulm embolism unlikely but still possible, also noted tohave emphysema on CTA, but unlikely to have such acute presentation  - Start CHF exacerbation work up with Lasix 80 mg IV, strict I/O - Cont home beta blocker and ACE-I - 2D Echo tomorrow - O2 as needed - Ischemia work up given recent worsening of symptoms  - Consult [facility] in AM since he is patient of Dr. Jens Som   # Hypertension - Cont home amlodipine, benazepril, cardura, hydralazine, and metoprolol  # OSA - Home CPAP  # Chronic MSK Pain - Cont home oxycodone 30 mg IR  # DM, Type 2 - Last A1C 7.0 in feb 2014 - Hold metformin given contrast dye, SSI  # Depression - Cont home bupropion  FENGI - Carb modified diet PPX - Heparin  DISPO - Admit to telemetry floor under  Family Med Teaching Service   Si Raider. Clinton Sawyer, MD, MBA 03/02/2013, 11:11 PM Family Medicine Resident, PGY-2 [phone] pager

## 2013-03-03 ENCOUNTER — Inpatient Hospital Stay (HOSPITAL_COMMUNITY): Payer: Medicare Other

## 2013-03-03 ENCOUNTER — Other Ambulatory Visit: Payer: Self-pay

## 2013-03-03 DIAGNOSIS — J432 Centrilobular emphysema: Secondary | ICD-10-CM | POA: Diagnosis present

## 2013-03-03 DIAGNOSIS — I517 Cardiomegaly: Secondary | ICD-10-CM

## 2013-03-03 DIAGNOSIS — R0989 Other specified symptoms and signs involving the circulatory and respiratory systems: Secondary | ICD-10-CM

## 2013-03-03 DIAGNOSIS — E118 Type 2 diabetes mellitus with unspecified complications: Secondary | ICD-10-CM

## 2013-03-03 DIAGNOSIS — R0902 Hypoxemia: Secondary | ICD-10-CM

## 2013-03-03 DIAGNOSIS — I10 Essential (primary) hypertension: Secondary | ICD-10-CM

## 2013-03-03 DIAGNOSIS — J438 Other emphysema: Secondary | ICD-10-CM

## 2013-03-03 DIAGNOSIS — J811 Chronic pulmonary edema: Secondary | ICD-10-CM

## 2013-03-03 LAB — CBC
HCT: 43.8 % (ref 39.0–52.0)
Hemoglobin: 15.5 g/dL (ref 13.0–17.0)
RDW: 15.9 % — ABNORMAL HIGH (ref 11.5–15.5)
WBC: 5.8 10*3/uL (ref 4.0–10.5)

## 2013-03-03 LAB — GLUCOSE, CAPILLARY
Glucose-Capillary: 113 mg/dL — ABNORMAL HIGH (ref 70–99)
Glucose-Capillary: 132 mg/dL — ABNORMAL HIGH (ref 70–99)
Glucose-Capillary: 140 mg/dL — ABNORMAL HIGH (ref 70–99)

## 2013-03-03 LAB — TROPONIN I
Troponin I: <0.3 ng/mL (ref <0.30)
Troponin I: <0.3 ng/mL (ref <0.30)

## 2013-03-03 LAB — BASIC METABOLIC PANEL
CO2: 25 mEq/L (ref 19–32)
Glucose, Bld: 100 mg/dL — ABNORMAL HIGH (ref 70–99)
Potassium: 3.8 mEq/L (ref 3.5–5.1)
Sodium: 142 mEq/L (ref 135–145)

## 2013-03-03 MED ORDER — FUROSEMIDE 10 MG/ML IJ SOLN
40.0000 mg | Freq: Once | INTRAMUSCULAR | Status: AC
  Administered 2013-03-03: 40 mg via INTRAVENOUS
  Filled 2013-03-03: qty 4

## 2013-03-03 MED ORDER — SODIUM CHLORIDE 0.9 % IV SOLN
INTRAVENOUS | Status: DC
  Administered 2013-03-02: 500 mL via INTRAVENOUS

## 2013-03-03 MED ORDER — SALINE SPRAY 0.65 % NA SOLN
1.0000 | NASAL | Status: DC | PRN
  Filled 2013-03-03: qty 44

## 2013-03-03 NOTE — Progress Notes (Signed)
I discussed with Dr Kuneff.  I agree with their plans documented in their progress note for today.  

## 2013-03-03 NOTE — H&P (Signed)
[facility] Attending Admission Note: [name] Levy MD [phone] pager office [phone] I  have seen and examined this patient, reviewed their chart. I have discussed this patient with the resident. I agree with the resident's findings, assessment and care plan.with the following changes: I doubt CHF as etiology given relatively normal ECHO, EF 55% (even in setting of combined diastolic and systolic dysfunction I do not think his clinical picture c/w CHF) no rales, non-significant weight gain. Some concern about his mild adenopathy--could this be underlying malignant process causing hypexemia? Most likely it is long standing noncompliance with his CPAP, poorly controlled HTN (until recently) and his smoking. The CTA was of poor quality so I discussed with Dr. McDiarmid how to further rule out PE. Perhaps pulmonary consult would be beneficial.

## 2013-03-03 NOTE — Progress Notes (Signed)
  Echocardiogram 2D Echocardiogram has been performed.  Darlina Mccaughey FRANCES 03/03/2013, 5:43 PM

## 2013-03-03 NOTE — Consult Note (Signed)
PULMONARY  / CRITICAL CARE MEDICINE  Name: [name] [name] MRN: [id number] DOB: Jul 29, 1956    ADMISSION DATE:  03/02/2013 CONSULTATION DATE:  03/03/13  REFERRING MD :  Dr. Jennette Kettle PRIMARY SERVICE:  FP TS  CHIEF COMPLAINT:  Dyspnea / Hypoxemia  BRIEF PATIENT DESCRIPTION: [name] with PMH of OSA (non-compliant as of last 6 mo), admitted with dyspnea / hypoxemia.  PCCM asked to evaluate 4/23.    SIGNIFICANT EVENTS / STUDIES:  4/22 - admit with dyspnea, hypoxemia  LINES / TUBES:   CULTURES:   ANTIBIOTICS:   HISTORY OF PRESENT ILLNESS: [name], former smoker (quit 02/16/13), DM, HLD, HTN, obesity, OSA who was admitted 4/22 per FP TS for a two week history of worsening shortness of breath.  He reports that in 12/2012 he was walking up to 5 miles per day on treadmill after a back surgery.  Most recently, he has had difficulty with exertion.  He quit using his CPAP machine approx 6 months ago because he felt it has not been working properly.  Also, he quit taking his home lasix dosing to keep from having to urinate at night and has noted increased lower extremity swelling, difficulty lying flat (2 pillows at baseline), increased abd swelling, and 10 lb weight gain.  He was seen in [facility] on 4/22 and noted to be hypoxic with sats of 75% with ambulation. ECHO in 11/2012 with EF of 55% and severe LVH.    Pt denies fevers, chills, feeling like an acute illness, n/v/d, cough with sputum production, chest pain, hemoptysis.    PAST MEDICAL HISTORY :  Past Medical History  Diagnosis Date  . Renal insufficiency, mild   . OA (osteoarthritis) of knee     right; mod-severe  . DM (diabetes mellitus), type 2     mild mod sensory loss plantar feet  . OA (osteoarthritis)   . History of colonoscopy   . Peripheral autonomic neuropathy due to secondary diabetes   . Hypothyroidism     since thyroidectomy  . Anxiety     takes buspar  . Sleep apnea     on cpap  . Complication of anesthesia     pt.  reports he "flat lined " during surgery in  1999, told that  he had to be revived .  Pt. reports that it was surg. to replace his knee & [facility] [place].  [facility] to [facility]- no record avail. on this pt.   Marland Kitchen HTN (hypertension)     labile, followed by Dr. Jens Som  . Diastolic dysfunction   . Renal insufficiency    Past Surgical History  Procedure Laterality Date  . Total knee arthroplasty  2001    left  . Thyroidectomy, partial      remote  . Cardiolite--no cad ef 34%  01/01/2005  . Joint replacement  2000    left knee   . Lumbar laminectomy/decompression microdiscectomy  09/25/2011    Procedure: LUMBAR LAMINECTOMY/DECOMPRESSION MICRODISCECTOMY;  Surgeon: Karn Cassis;  Location: [facility];  Service: Neurosurgery;  Laterality: Right;  Right Lumbar Five-Sacral One Microdiscectomy  . Back surgery      11/12  . Lumbar laminectomy/decompression microdiscectomy  01/03/2012    Procedure: LUMBAR LAMINECTOMY/DECOMPRESSION MICRODISCECTOMY;  Surgeon: Karn Cassis, MD;  Location: [facility];  Service: Neurosurgery;  Laterality: Right;  Right L5-S1 Redo Diskectomy   Prior to Admission medications   Medication Sig Start Date End Date  Taking? Authorizing Provider  allopurinol (ZYLOPRIM) 100 MG tablet Take 1 tablet (100 mg total) by mouth daily. 01/13/13  Yes Nestor Ramp, MD  amLODipine (NORVASC) 10 MG tablet Take 1 tablet (10 mg total) by mouth every morning. 04/30/12  Yes Lewayne Bunting, MD  benazepril (LOTENSIN) 40 MG tablet Take 1 tablet (40 mg total) by mouth daily. 04/30/12  Yes Lewayne Bunting, MD  buPROPion (WELLBUTRIN XL) 150 MG 24 hr tablet Take 1 tablet (150 mg total) by mouth daily. 12/16/12  Yes Nestor Ramp, MD  doxazosin (CARDURA) 8 MG tablet Take 1 tablet (8 mg total) by mouth daily. 04/30/12  Yes Lewayne Bunting, MD  furosemide (LASIX) 80 MG tablet Take 0.5-1 tablets (40-80 mg total) by mouth daily as needed. for lower extremity edema 04/30/12   Yes Lewayne Bunting, MD  hydrALAZINE (APRESOLINE) 100 MG tablet Take 100 mg by mouth 3 (three) times daily.   Yes Historical Provider, MD  metFORMIN (GLUCOPHAGE) 1000 MG tablet Take 1,000 mg by mouth 2 (two) times daily with a meal.   Yes Historical Provider, MD  metoprolol (LOPRESSOR) 100 MG tablet Take 1 tablet (100 mg total) by mouth 2 (two) times daily. 04/30/12  Yes Lewayne Bunting, MD  oxycodone (ROXICODONE) 30 MG immediate release tablet Take one tablet every 6 hours as needed for back pain. Do not fill before sixty days from the date of this prescription 01/13/13  Yes Nestor Ramp, MD  potassium chloride (K-DUR) 10 MEQ tablet Take 1 tablet (10 mEq total) by mouth daily. 04/30/12  Yes Lewayne Bunting, MD  simvastatin (ZOCOR) 20 MG tablet Take 1 tablet (20 mg total) by mouth daily. 04/30/12  Yes Lewayne Bunting, MD   Allergies  Allergen Reactions  . Shellfish Allergy Other (See Comments)    Reaction unknown    FAMILY HISTORY:  Family History  Problem Relation Age of Onset  . Diabetes Mother   . Hypertension Mother   . Aneurysm Mother   . Anesthesia problems Neg Hx   . Hypotension Neg Hx   . Malignant hyperthermia Neg Hx   . Pseudochol deficiency Neg Hx    SOCIAL HISTORY:  reports that he quit smoking 8 days ago. His smoking use included Cigarettes. He has a 28 pack-year smoking history. He has never used smokeless tobacco. He reports that he does not drink alcohol or use illicit drugs.  REVIEW OF SYSTEMS:   See HPI for pertinent positives.  All systems reviewed and otherwise negative.   SUBJECTIVE: Pt currently denies shortness of breath / sputum production / fevers / chills.   VITAL SIGNS: Temp:  [98.1 F (36.7 C)-99.1 F (37.3 C)] 98.1 F (36.7 C) (04/23 0618) Pulse Rate:  [74-94] 74 (04/23 1035) Resp:  [14-20] 19 (04/23 0618) BP: (146-166)/(91-104) 147/100 mmHg (04/23 1035) SpO2:  [80 %-97 %] 93 % (04/23 0618) FiO2 (%):  [35 %] 35 % (04/22 2333) Weight:  [258 lb 2.5  oz (117.1 kg)-261 lb (118.389 kg)] 258 lb 2.5 oz (117.1 kg) (04/22 2343)  PHYSICAL EXAMINATION: General:  wdwn adult male in NAD Neuro:  AAOx4, speech clear, MAE HEENT:  Mm pink/moist, dentures seem ill fitting in conversation  Cardiovascular:  s1s2 rrr, no m/r/g Lungs:  resp's even/non-labored, lungs bilaterally distant but clear Abdomen:  Obese/soft, bsx4 active Musculoskeletal:  No acute deformities Skin:  Warm/dry, 1+ LE edema   Recent Labs Lab 03/02/13 1749 03/03/13 0625  NA 142 142  K 4.2  3.8  CL 109 107  CO2 24 25  BUN 14 14  CREATININE 1.30 1.35  GLUCOSE 104* 100*    Recent Labs Lab 03/02/13 1749 03/03/13 0625  HGB 15.5 15.5  HCT 44.1 43.8  WBC 5.4 5.8  PLT 130* 122*   Dg Chest 2 View  03/02/2013  *RADIOLOGY REPORT*  Clinical Data: Shortness of breath.  CHEST - 2 VIEW  Comparison: 09/11/2011 03/27/2011 chest radiographs  Findings: The cardiomediastinal silhouette is unremarkable. Pulmonary vascular congestion is identified. Mild interstitial opacities are suspicious for interstitial edema. Mild bibasilar atelectasis is noted. Elevation of the right hemidiaphragm is again noted. There is no evidence of pneumothorax or large pleural effusion.  IMPRESSION: Pulmonary vascular congestion with probable mild interstitial edema.  Mild basilar atelectasis.   Original Report Authenticated By: Harmon Pier, M.D.    Ct Angio Chest Pe W/cm &/or Wo Cm  03/02/2013  *RADIOLOGY REPORT*  Clinical Data: Shortness of breath.  Rule out pulmonary embolism.  CT ANGIOGRAPHY CHEST  Technique:  Multidetector CT imaging of the chest using the standard protocol during bolus administration of intravenous contrast. Multiplanar reconstructed images including MIPs were obtained and reviewed to evaluate the vascular anatomy.  Contrast: OMNIPAQUE IOHEXOL 350 MG/ML SOLN  Comparison: Plain film of earlier in the day.  Chest CT of 12/21/2006.  Findings: Lung windows demonstrate motion degradation  throughout. Moderate centrilobular emphysema.  Bibasilar volume loss.  Soft tissue windows:  The quality of this exam for evaluation of pulmonary embolism is poor.  In addition to the above described motion, the bolus is suboptimally timed, with the majority of the contrast in the SVC.  No central pulmonary embolism.  No large lobar embolism.  Low density bilateral thyroid nodules which are nonspecific.  Mild ascending aortic dilatation at 4.3 cm compared to 4.1 cm on the prior.  Moderate cardiomegaly, without pericardial effusion.  Pulmonary artery enlargement, with the outflow tract measuring 4.3 cm.  Increased number of small mediastinal nodes.  Right paratracheal adenopathy 1.7 cm on image 32/series 4. Subcarinal adenopathy 1.9 cm on image 44/series 4.  Right hilar adenopathy at 2.9 cm.  Limited abdominal imaging demonstrates no significant findings.  No acute osseous abnormality.  IMPRESSION:  1.  Severely degraded evaluation for pulmonary embolism.  No large embolism identified. 2.  Otherwise, motion degraded exam.  Centrilobular emphysema without definite pulmonary parenchymal process. 3.  Development of thoracic adenopathy.  Although this could be reactive, lymphoproliferative process cannot be excluded.  Consider non emergent PET.  If the patient has clinical congestive heart failure, short-term follow-up after appropriate diuresis could exclude adenopathy secondary to congestive failure. 4. Pulmonary artery enlargement suggests pulmonary arterial hypertension. 5.  Mild ascending aortic ectasia.  The patient could not be rescanned secondary to renal insufficiency.   Original Report Authenticated By: Jeronimo Greaves, M.D.    Dg Chest Port 1 View  03/03/2013  *RADIOLOGY REPORT*  Clinical Data: Shortness of breath, pulmonary congestion.  PORTABLE CHEST - 1 VIEW  Comparison: CT chest and chest radiograph 03/02/2013.  Findings: Trachea is midline.  Heart size stable.  Lungs are low in volume with diffuse mixed  interstitial and air space disease, possibly minimally worsened from 03/02/2013.  No definite pleural fluid.  IMPRESSION: Probable pulmonary edema with slight worsening from 03/02/2013.   Original Report Authenticated By: Leanna Battles, M.D.     ASSESSMENT / PLAN:  Dyspnea  Hypoxemia OSA   Patient has had recent medical non-compliance with CPAP & diuretic regimen. No large  PE identified on CTA (note motion on exam).  PAH noted on CTA, previous ECHO with EF of 55-60%, severe LVH.  Most likely his medical non-compliance is contributing factor for current dyspnea / hypoxemia.   Plan: -repeat ECHO this admit to eval for PAH -aggressive diuresis -initiate auto-set cpap while inpatient.  Believe he was on 14 cm of H20 at home.  -will need [facility] to review machine set up  -will need assessment for home O2 prior to discharge -follow up with sleep MD as outpatient   Canary Brim, NP-C [facility] Pgr: [phone] or [phone]  03/03/2013, 11:35 AM  Patient seen and examined, agree with above note.  I dictated the care and orders written for this patient under my direction.  Alyson Reedy, MD [phone]

## 2013-03-03 NOTE — Progress Notes (Signed)
Rt Note: Placed pt on Cpap with full face and nasal mask on auto titrate with 4L O2 bleed in. Pt did not tolerate eother mask and wants to just wear the 4L [place]. I told him to call if he changes his mind. RT will continue to monitor

## 2013-03-03 NOTE — Progress Notes (Signed)
PT refused his CPAP and asked if he could just wear his [place]. I told him to call if he changed his mind

## 2013-03-03 NOTE — Addendum Note (Signed)
Addended by: Altamese Dilling A on: 03/03/2013 02:43 PM   Modules accepted: Orders

## 2013-03-03 NOTE — Progress Notes (Signed)
Patient ID: [name] [name], male   DOB: 1956/05/12, 58 y.o.   MRN: [id number] Family Medicine Teaching Service Daily Progress Note Service Page: [phone]   Subjective:  [name] [name] feels he is breathing a little better today than when he came into the hospital. He denies chest pain, abdominal pain, nausea or vomit. He admits to stopping his night time diuretic because he was unable to get sleep. His wife has been recently discharged from the hospital and that has changed his daily schedule, which included walking on treadmill and healthier cooking to attend to her medical needs. He tolerated his diet this morning. Of note: He reports he does not wear his CPAP at home and needs to have it checked out because it is so old. Last BM two days ago, which is his normal.   Objective: Temp:  [98.1 F (36.7 C)-99.1 F (37.3 C)] 98.1 F (36.7 C) (04/23 0618) Pulse Rate:  [74-94] 74 (04/23 0618) Cardiac Rhythm:  [-] Normal sinus rhythm (04/23 0135) Resp:  [14-20] 19 (04/23 0618) BP: (146-166)/(91-104) 146/91 mmHg (04/23 0618) SpO2:  [80 %-97 %] 93 % (04/23 0618) FiO2 (%):  [35 %] 35 % (04/22 2333) Weight:  [258 lb 2.5 oz (117.1 kg)-261 lb (118.389 kg)] 258 lb 2.5 oz (117.1 kg) (04/22 2343)  Intake/Output Summary (Last 24 hours) at 03/03/13 0707 Last data filed at 03/03/13 0626  Gross per 24 hour  Intake      0 ml  Output   1200 ml  Net  -1200 ml    Exam: Gen: Winded. Without the ability to speak full sentences without stopping for breath. Currently on 4LnC. HEENT: NCAT, EOMI CV: RRR. No murmur Resp: CTAB, labored, good air movement. No wheezing, crackles or rhonchi.  Abd: Soft. NTND. BS present Ext: No erythema, trace edema. +2/4 pulses bilaterally.  Skin: No rashes noted. Well perfused. Neuro: Alert and oriented, No gross deficits  I have reviewed the patient's medications, labs, imaging, and diagnostic testing.  Notable results are summarized below. CBC    Component Value Date/Time   WBC 5.4 03/02/2013 1749   RBC 5.56 03/02/2013 1749   HGB 15.5 03/02/2013 1749   HCT 44.1 03/02/2013 1749   PLT 130* 03/02/2013 1749   MCV 79.3 03/02/2013 1749   MCH 27.9 03/02/2013 1749   MCHC 35.1 03/02/2013 1749   RDW 16.1* 03/02/2013 1749   LYMPHSABS 1.2 03/02/2013 1749   MONOABS 0.4 03/02/2013 1749   EOSABS 0.1 03/02/2013 1749   BASOSABS 0.0 03/02/2013 1749     CMP     Component Value Date/Time   NA 142 03/02/2013 1749   K 4.2 03/02/2013 1749   CL 109 03/02/2013 1749   CO2 24 03/02/2013 1749   GLUCOSE 104* 03/02/2013 1749   BUN 14 03/02/2013 1749   CREATININE 1.30 03/02/2013 1749   CREATININE 1.27 12/16/2012 0951   CALCIUM 8.7 03/02/2013 1749   PROT 6.5 12/16/2012 0951   ALBUMIN 3.7 12/16/2012 0951   AST 12 12/16/2012 0951   ALT 8 12/16/2012 0951   ALKPHOS 82 12/16/2012 0951   BILITOT 0.6 12/16/2012 0951   GFRNONAA 60* 03/02/2013 1749   GFRAA 69* 03/02/2013 1749    03/02/2013   CXR IMPRESSION: Pulmonary vascular congestion with probable mild interstitial edema.  Mild basilar atelectasis.   Original Report Authenticated By: Harmon Pier, M.D.    Ct Angio Chest Pe W/cm &/or Wo Cm  03/02/2013    IMPRESSION:  1.  Severely degraded evaluation for pulmonary  embolism.  No large embolism identified. 2.  Otherwise, motion degraded exam.  Centrilobular emphysema without definite pulmonary parenchymal process. 3.  Development of thoracic adenopathy.  Although this could be reactive, lymphoproliferative process cannot be excluded.  Consider non emergent PET.  If the patient has clinical congestive heart failure, short-term follow-up after appropriate diuresis could exclude adenopathy secondary to congestive failure. 4. Pulmonary artery enlargement suggests pulmonary arterial hypertension. 5.  Mild ascending aortic ectasia.  The patient could not be rescanned secondary to renal insufficiency.   Original Report Authenticated By: Jeronimo Greaves, M.D.    Assessment/Plan: [name] [name] is a 57 y.o. year old male presenting  with hypoxemia and dyspnea concerning for a CHF exacerbation vs pulmonary pathology.   Dyspea/Hypoxemia - Possibly CHF exacerbation given history of non compliance with Lasix and pulm edema on chest X-ray, but symptoms seem out of proportion of recent echo or rest of physical exam; Pulm embolism unlikely but still possible, also noted to have emphysema on CTA, but unlikely to have such acute presentation.  Concern for the development of thoracic adenopathy with patients smoking history. - Start CHF exacerbation work up with Lasix 80 mg IV, strict I/O --> 1.2L output over night - Cont home beta blocker and ACE-I  - 2D Echo today (last echo 55% with severe LVH)  - O2 as needed --> currently 4lNC - Ischemia work up given recent worsening of symptoms  - Consult [facility] today  Hypertension - Cont home amlodipine, benazepril, cardura, hydralazine, and metoprolol  OSA - Home CPAP--> will need to care management to see about getting upgraded machine or supplies.   Chronic [facility] Pain - Cont home oxycodone 30 mg IR   DM, Type 2 - Last A1C 7.0 in feb 2014  - Hold metformin given contrast dye, SSI   Depression - Cont home bupropion  FENGI - Carb modified diet  PPX - Heparin  DISPO - Admit to telemetry floor under Family Med Teaching Service, dispo pending clinical improvement and pulmonology and cards recommendations.

## 2013-03-03 NOTE — Progress Notes (Signed)
Utilization Review Completed.[name] [name] T4/23/2014

## 2013-03-03 NOTE — Progress Notes (Signed)
Nutrition Brief Note  Patient identified on the Malnutrition Screening Tool (MST) Report for recent weight loss without trying (patient unsure).  Per weight records, patient weight has been fluctuating more than likely due to fluid, medical hx (ie CHF).  Wt Readings from Last 10 Encounters:  03/02/13 258 lb 2.5 oz (117.1 kg)  03/02/13 261 lb (118.389 kg)  01/13/13 261 lb (118.389 kg)  12/16/12 261 lb 12.8 oz (118.752 kg)  10/27/12 259 lb (117.482 kg)  08/26/12 259 lb 11.2 oz (117.799 kg)  07/15/12 260 lb (117.935 kg)  06/10/12 261 lb 1.6 oz (118.434 kg)  05/25/12 259 lb (117.482 kg)  05/06/12 261 lb (118.389 kg)    Body mass index is 35 kg/(m^2). Patient meets criteria for Obesity Class II based on current BMI.   Current diet order is Heart Healthy, patient is consuming approximately 100% of meals at this time. Labs and medications reviewed.   No nutrition interventions warranted at this time. If nutrition issues arise, please consult RD.   Maureen Chatters, RD, LDN Pager #: [phone] After-Hours Pager #: [phone]

## 2013-03-03 NOTE — Discharge Summary (Signed)
Physician Discharge Summary  Patient ID: [name] [name] MRN: [id number] DOB/AGE: 19-Nov-1955 57 y.o.  Admit date: 03/02/2013 Discharge date: 03/05/2013 Admission Diagnoses: Hypoxemia in office  Discharge Diagnoses:  Principal Problem:   Hypoxemia Active Problems:   DIABETES MELLITUS, CONTROLLED, WITH COMPLICATIONS   DEPRESSIVE DISORDER, NOS   APNEA, SLEEP   HYPERTENSION, BENIGN SYSTEMIC   Radicular low back pain   Centrilobular emphysema   Chronic pulmonary edema   CHF exacerbation   Discharged Condition: good  Hospital Course:  [name] [name] is a 57 y.o.  year old male that presented with hypoxemia and dyspnea, with oxygen saturations in the 70's in the office with ambulation.    Dyspea/Hypoxemia - Patient was admitted to telemetry unit and required 4LNC overnight. CXR was obtained and resulted in pulmonary vascular congestion, with mild interstitial edema.  Patient was given lasix IV  For diuresis. CT resulted in thoracic adenopathy and there was some concern for lung pathology vs. CHF etiology.  Pulmonology was consulted and are not concerned for lung pathology and recommended aggressive diuresis. Patient was continued on IV lasix with an ~5 pound water weight loss.  ECHO: 40-45% with moderate LVH and calcified mitral valve, mildly dilated right atrium (last echo 55% with severe LVH). Troponins were obtained to rule out ischemia and were negative x3. PT ambulated with the patient and he experienced desaturations into the 80's without supplemental oxygen. CPAP was ordered nightly, however patient was having difficulty with the in house mask was the type that breaks out his face. On the last night of admission he was able to wear his at home mask and tolerated the machine well.  On day of discharge he was able to ambulate on RA and maintain >95% oxygen saturations.  Hypertension - Cont home amlodipine, benazepril, cardura, hydralazine, and metoprolol  OSA - Home CPAP.  Care management  working with the patient,  to see about getting upgraded machine or supplies.  Chronic MSK Pain - Continued home oxycodone 30 mg IR  DM, Type 2 - Last A1C 7.0 in feb 2014. Held metformin given contrast dye, placed on  SSI. CBG 142-164 CBG (last 3)  Depression - Continued home bupropion    Consults: None  Significant Diagnostic Studies:  Dg Chest 2 View  03/02/2013  Findings: The cardiomediastinal silhouette is unremarkable. Pulmonary vascular congestion is identified. Mild interstitial opacities are suspicious for interstitial edema. Mild bibasilar atelectasis is noted. Elevation of the right hemidiaphragm is again noted. There is no evidence of pneumothorax or large pleural effusion.  IMPRESSION: Pulmonary vascular congestion with probable mild interstitial edema.  Mild basilar atelectasis.   Original Report Authenticated By: Harmon Pier, M.D.    Ct Angio Chest Pe W/cm &/or Wo Cm  03/02/2013   IMPRESSION:  1.  Severely degraded evaluation for pulmonary embolism.  No large embolism identified. 2.  Otherwise, motion degraded exam.  Centrilobular emphysema without definite pulmonary parenchymal process. 3.  Development of thoracic adenopathy.  Although this could be reactive, lymphoproliferative process cannot be excluded.  Consider non emergent PET.  If the patient has clinical congestive heart failure, short-term follow-up after appropriate diuresis could exclude adenopathy secondary to congestive failure. 4. Pulmonary artery enlargement suggests pulmonary arterial hypertension. 5.  Mild ascending aortic ectasia.  The patient could not be rescanned secondary to renal insufficiency.   Original Report Authenticated By: Jeronimo Greaves, M.D.    Dg Chest Port 1 View  03/03/2013  Findings: Trachea is midline.  Heart size stable.  Lungs are  low in volume with diffuse mixed interstitial and air space disease, possibly minimally worsened from 03/02/2013.  No definite pleural fluid.  IMPRESSION: Probable pulmonary  edema with slight worsening from 03/02/2013.   Original Report Authenticated By: Leanna Battles, M.D.     Treatments: IV hydration and cardiac meds: furosemide, PT  Discharge Exam: Blood pressure 132/87, pulse 74, temperature 98.3 F (36.8 C), temperature source Oral, resp. rate 16, height 6' (1.829 m), weight 256 lb 11.2 oz (116.438 kg), SpO2 91.00%. Gen: Currently on room air. Breathing well. Pleasant, reading paper.  HEENT: NCAT, EOMI  CV: RRR. No murmur  Resp: CTAB, good air movement. No wheezing, crackles or rhonchi. Not labored today.  Abd: Soft. NTND. BS present  Ext: No erythema, trace edema right, +1 edema left (hanging off of bed). +2/4 pulses bilaterally.  Skin: No rashes noted. Well perfused.  Neuro: Alert and oriented, No gross deficits   Disposition: 01-Home or Self Care      Discharge Orders   Future Appointments Provider Department Dept Phone   03/31/2013 11:00 AM Waymon Budge, MD [facility] [phone]   Future Orders Complete By Expires     Diet - low sodium heart healthy  As directed     Discharge instructions  As directed     Comments:      Per his walk this afternoon hedoes not need home oxygen. He must wear his CPAP nightly and take his daily lasix.    Increase activity slowly  As directed         Medication List    TAKE these medications       allopurinol 100 MG tablet  Commonly known as:  ZYLOPRIM  Take 1 tablet (100 mg total) by mouth daily.     amLODipine 10 MG tablet  Commonly known as:  NORVASC  Take 1 tablet (10 mg total) by mouth every morning.     benazepril 40 MG tablet  Commonly known as:  LOTENSIN  Take 1 tablet (40 mg total) by mouth daily.     buPROPion 150 MG 24 hr tablet  Commonly known as:  WELLBUTRIN XL  Take 1 tablet (150 mg total) by mouth daily.     doxazosin 8 MG tablet  Commonly known as:  CARDURA  Take 1 tablet (8 mg total) by mouth daily.     furosemide 80 MG tablet  Commonly known as:  LASIX   Take 0.5-1 tablets (40-80 mg total) by mouth daily as needed. for lower extremity edema     hydrALAZINE 100 MG tablet  Commonly known as:  APRESOLINE  Take 100 mg by mouth 3 (three) times daily.     metFORMIN 1000 MG tablet  Commonly known as:  GLUCOPHAGE  Take 1,000 mg by mouth 2 (two) times daily with a meal.     metoprolol 100 MG tablet  Commonly known as:  LOPRESSOR  Take 1 tablet (100 mg total) by mouth 2 (two) times daily.     oxycodone 30 MG immediate release tablet  Commonly known as:  ROXICODONE  Take one tablet every 6 hours as needed for back pain.  Do not fill before sixty days from the date of this prescription     potassium chloride 10 MEQ tablet  Commonly known as:  K-DUR  Take 1 tablet (10 mEq total) by mouth daily.     simvastatin 20 MG tablet  Commonly known as:  ZOCOR  Take 1 tablet (20 mg total) by mouth  daily.       Follow-up Information   Follow up with Waymon Budge, MD On 03/31/2013. (10:45 am)    Contact information:   [address]  [facility] [place] [address] [phone]       Schedule an appointment as soon as possible for a visit with Denny Levy, MD.   Contact information:   [address] [place] [address] [phone]     Outpatient recommendations:  - CT on admission resulted with new development of thoracic adenopathy, could have been reactive and related to CHF process, but lymphoproliferate process could not be ruled out; May need follow up  - Care management was consulted on attempting to update his machine and supplies. Patient was encourage to wear his CPAP every night.  - Prior to discharge he was noncompliant with his lasix medication; encouraged to take prescription as prescribed.  - No  Supplemental oxygen requirement was needed on discharge.  SignedFelix Pacini 03/07/2013, 10:36 PM

## 2013-03-04 DIAGNOSIS — G4730 Sleep apnea, unspecified: Secondary | ICD-10-CM

## 2013-03-04 DIAGNOSIS — R0602 Shortness of breath: Secondary | ICD-10-CM

## 2013-03-04 LAB — GLUCOSE, CAPILLARY: Glucose-Capillary: 159 mg/dL — ABNORMAL HIGH (ref 70–99)

## 2013-03-04 LAB — BASIC METABOLIC PANEL
CO2: 26 mEq/L (ref 19–32)
Calcium: 9.2 mg/dL (ref 8.4–10.5)
Creatinine, Ser: 1.53 mg/dL — ABNORMAL HIGH (ref 0.50–1.35)
GFR calc Af Amer: 57 mL/min — ABNORMAL LOW (ref >90)
GFR calc non Af Amer: 49 mL/min — ABNORMAL LOW (ref >90)
Sodium: 143 mEq/L (ref 135–145)

## 2013-03-04 MED ORDER — FUROSEMIDE 10 MG/ML IJ SOLN
80.0000 mg | Freq: Once | INTRAMUSCULAR | Status: AC
  Administered 2013-03-04: 80 mg via INTRAVENOUS
  Filled 2013-03-04: qty 8

## 2013-03-04 NOTE — Progress Notes (Signed)
Pt states he is not refusing to use hospital CPAP.  The mask apparatus causes him to break out around his nose and mouth and is unable to use it for that reason.  He is very willing to wear one from hospital that will not cause this problem.  He states the mask he has from home does not correctly fit our hospital machine.  He is waiting to get an updated machine/mask through Dr. Maple Hudson after discharge. I will notify resp therapy of issue to be resolved immediately

## 2013-03-04 NOTE — Progress Notes (Signed)
I discussed with Dr Kuneff.  I agree with their plans documented in their progress note for today.  

## 2013-03-04 NOTE — Progress Notes (Signed)
O2 down to 2L [place]; will cont to wean O2 as tolerated.

## 2013-03-04 NOTE — Evaluation (Signed)
Physical Therapy Evaluation Patient Details Name: [name] [name] MRN: [id number] DOB: 12/19/1955 Today's Date: 03/04/2013 Time: 1340-1406 PT Time Calculation (min): 26 min  PT Assessment / Plan / Recommendation Clinical Impression  [name] with PMH of OSA (non-compliant as of last 6 mo), admitted with dyspnea / hypoxemia. At this time patient demonstrates good functional mobility. Only concern for patinet would be O2 saturation/ activity tolerance. Patient ambulated on 4 liters desaturated to 89%; rebounded with rest. Spoke with patient at length regarding techniques for energy conservation at home and possible use of O2 if needed. Encouraged pt to continue ambulation with nsg on supplemental oxygen. At this time patient demonstrates no acute PT needs. PT will sign off.     PT Assessment  Patent does not need any further PT services    Follow Up Recommendations   No PT Follow up          Equipment Recommendations  None recommended by PT          Precautions / Restrictions Restrictions Weight Bearing Restrictions: No   Pertinent Vitals/Pain No pain.  SpO2 on 4 liters with activity ranged (89% to 96%)      Mobility  Bed Mobility Bed Mobility: Supine to Sit;Sitting - Scoot to Edge of Bed Supine to Sit: 7: Independent Sitting - Scoot to [place] of Bed: 7: Independent Details for Bed Mobility Assistance: No assist or cues needed Transfers Transfers: Sit to Stand;Stand to Sit Sit to Stand: 7: Independent Stand to Sit: 7: Independent Details for Transfer Assistance: No assist needed Ambulation/Gait Ambulation/Gait Assistance: 7: Independent Ambulation Distance (Feet): 300 Feet Assistive device: None Ambulation/Gait Assistance Details: steady with ambulation; O2 monitored desaturated to 89% on 4 liters with ambulation; on 6 liters >95% Gait Pattern: Within Functional Limits Gait velocity: WFL for community ambulation General Gait Details: steady    Exercises General  Exercises - Lower Extremity Ankle Circles/Pumps: AROM;Both Quad Sets: AROM;Both Long Arc Quad: AROM;Both Heel Slides: AROM;Both (for nerve mobility (R LE))     Visit Information  Last PT Received On: 03/04/13 Assistance Needed: +1    Subjective Data  Subjective: I have been helping my wife when she got out of the hospital Patient Stated Goal: to go home   Prior Functioning  Home Living Lives With: Spouse Available Help at Discharge: Family;Available PRN/intermittently (wife to be having a kidney transplant) Type of Home: House Home Access: Level entry Home Layout: One level Bathroom Shower/Tub: Engineer, manufacturing systems: Standard Home Adaptive Equipment: Bedside commode/3-in-1 Prior Function Level of Independence: Independent Able to Take Stairs?: Yes Driving: Yes Vocation: On disability Dominant Hand: Right    Cognition  Cognition Arousal/Alertness: Awake/alert Behavior During Therapy: WFL for tasks assessed/performed Overall Cognitive Status: Within Functional Limits for tasks assessed    Extremity/Trunk Assessment Right Upper Extremity Assessment RUE ROM/Strength/Tone: WFL for tasks assessed (something wrong with my "rotary cup") Left Upper Extremity Assessment LUE ROM/Strength/Tone: WFL for tasks assessed Right Lower Extremity Assessment RLE ROM/Strength/Tone: [facility] for tasks assessed Left Lower Extremity Assessment LLE ROM/Strength/Tone: WFL for tasks assessed   Balance Balance Balance Assessed: Yes High Level Balance High Level Balance Activites: Side stepping;Backward walking;Direction changes;Turns;Sudden stops;Head turns High Level Balance Comments: steady with all aspects of ambulation and balance  End of Session PT - End of Session Equipment Utilized During Treatment: Gait belt;Oxygen Activity Tolerance: Patient tolerated treatment well Patient left: in bed;with call bell/phone within reach;with family/visitor present Nurse Communication:  Mobility status;Other (comment) (Rec home O2)  GP  [name] [name] 03/04/2013, 2:52 PM [name] [name], PT DPT  [phone]

## 2013-03-04 NOTE — Care Management Note (Signed)
    Page 1 of 2   03/05/2013     3:57:29 PM   CARE MANAGEMENT NOTE 03/05/2013  Patient:  [name] [name], [name] [name]   Account Number:  [id number]  Date Initiated:  03/04/2013  Documentation initiated by:  Jsoeph Podesta  Subjective/Objective Assessment:   PT ADM WITH CHF EXACERBATION.  PTA, PT RESIDES AT HOME WITH WIFE AND IS INDEPENDENT.  HE HAS A CPAP MACHINE.     Action/Plan:   PT NEEDS NEW CPAP MACHINE, POSS HOME O2.   Anticipated DC Date:  03/05/2013   Anticipated DC Plan:  HOME W HOME HEALTH SERVICES      DC Planning Services  CM consult      Choice offered to / List presented to:     DME arranged  CPAP      DME agency  [facility]        Status of service:  Completed, signed off Medicare Important Message given?   (If response is "NO", the following Medicare IM given date fields will be blank) Date Medicare IM given:   Date Additional Medicare IM given:    Discharge Disposition:  HOME/SELF CARE  Per UR Regulation:  Reviewed for med. necessity/level of care/duration of stay  If discussed at Long Length of Stay Meetings, dates discussed:    Comments:  03/05/13 Rosalita Chessman [phone] PT FOR DC HOME TODAY. [facility] TO PROVIDE CPAP AT HOME.  PT TO FOLLOW UP WITH DR C. YOUNG FOR NEEDED SLEEP STUDY.  03/04/13 Ilynn Stauffer,RN,BSN [phone] PT NEEDS NEW CPAP MACHINE, AS HE STATES HIS IS "OLD AND NOT WORKING WELL." REFERRAL TO [facility] FOR DME NEEDS.  PT STATES HAD SLEEP STUDY AT [facility] ABOUT A YEAR AGO.  WILL COORDINATE WITH [facility] IN GETTING PT NEW MACHINE, IF POSSIBLE. MAY NEED HOME O2, AS NOTED PT DESATURATES WITH AMBULATION.

## 2013-03-04 NOTE — Progress Notes (Deleted)
Patient ID: [name] [name], male   DOB: 10/15/1956, 57 y.o.   MRN: [id number] Family Medicine Teaching Service Daily Progress Note Service Page: [phone]   Subjective:  Patient reports he is feeling a little better today, breathing a little easier. He has not been wearing his CPAP at night because the mask we have in hospital breaks out his skin. He is aware now, of the need to wear CPAP at night and will have his wife get his mask from home. He was told my RT that other masks are not available here. He feels like he is starting to get "cabin fever" and would really like to walk. No BM since admission.   Objective: Temp:  [98.2 F (36.8 C)-98.4 F (36.9 C)] 98.2 F (36.8 C) (04/24 0454) Pulse Rate:  [72-86] 73 (04/24 0454) Cardiac Rhythm:  [-] Heart block (04/24 0755) Resp:  [18-20] 20 (04/24 0454) BP: (131-149)/(93-100) 144/100 mmHg (04/24 0454) SpO2:  [92 %-96 %] 92 % (04/24 0454) Weight:  [255 lb 1.2 oz (115.7 kg)] 255 lb 1.2 oz (115.7 kg) (04/24 0454)  Intake/Output Summary (Last 24 hours) at 03/04/13 0937 Last data filed at 03/04/13 0456  Gross per 24 hour  Intake    480 ml  Output   1725 ml  Net  -1245 ml   Weight change: -5 lb 14.8 oz (-2.689 kg)  Exam: Gen: Looks more tired today.  Without the ability to speak full sentences without stopping for breath, slightly improved. Currently on 3LNC. HEENT: NCAT, EOMI CV: RRR. No murmur Resp: CTAB, labored, good air movement. No wheezing or rhonchi. Mild crackles RLL.  Abd: Soft. NTND. BS present Ext: No erythema, trace edema. +2/4 pulses bilaterally.  Skin: No rashes noted. Well perfused. Neuro: Alert and oriented, No gross deficits  I have reviewed the patient's medications, labs, imaging, and diagnostic testing.  Notable results are summarized below.  CMP     Component Value Date/Time   NA 143 03/04/2013 0430   K 4.6 03/04/2013 0430   CL 106 03/04/2013 0430   CO2 26 03/04/2013 0430   GLUCOSE 102* 03/04/2013 0430   BUN 19  03/04/2013 0430   CREATININE 1.53* 03/04/2013 0430   CREATININE 1.27 12/16/2012 0951   CALCIUM 9.2 03/04/2013 0430   PROT 6.5 12/16/2012 0951   ALBUMIN 3.7 12/16/2012 0951   AST 12 12/16/2012 0951   ALT 8 12/16/2012 0951   ALKPHOS 82 12/16/2012 0951   BILITOT 0.6 12/16/2012 0951   GFRNONAA 49* 03/04/2013 0430   GFRAA 57* 03/04/2013 0430    03/02/2013   CXR IMPRESSION: Pulmonary vascular congestion with probable mild interstitial edema.  Mild basilar atelectasis.   Original Report Authenticated By: Harmon Pier, M.D.    Ct Angio Chest Pe W/cm &/or Wo Cm  03/02/2013    IMPRESSION:  1.  Severely degraded evaluation for pulmonary embolism.  No large embolism identified. 2.  Otherwise, motion degraded exam.  Centrilobular emphysema without definite pulmonary parenchymal process. 3.  Development of thoracic adenopathy.  Although this could be reactive, lymphoproliferative process cannot be excluded.  Consider non emergent PET.  If the patient has clinical congestive heart failure, short-term follow-up after appropriate diuresis could exclude adenopathy secondary to congestive failure. 4. Pulmonary artery enlargement suggests pulmonary arterial hypertension. 5.  Mild ascending aortic ectasia.  The patient could not be rescanned secondary to renal insufficiency.   Original Report Authenticated By: Jeronimo Greaves, M.D.    Assessment/Plan: [name] [name] is a 57 y.o. year  old male presenting with hypoxemia and dyspnea concerning for a CHF exacerbation vs pulmonary pathology. Pulmonology was consulted yesterday and feels it is CHF exacerbation and advised for "aggrresive diuresis." We thank them for their recommendations. There has been no major improvement overnight, still requiring oxygen supplementation of 3LNC, he is not on home oxygen. He is diuresing with weight loss >5lbs and an additional 1.25L over 24 hours.   Dyspea/Hypoxemia - Pulmonology consulted and are not concerned for lung pathology and have recommenced  aggressive diuresis. We thank them for their recommendations.  - Lasix IV continued today at 80mg  IV until oxygen requirement is not needed and he is back to baseline.  - Cont home beta blocker and ACE-I  - 4/24 ECHO: 40-45% with moderate LVH and calcified mitral valve, mildly dilated right atrium (last echo 55% with severe LVH)  - O2 as needed --> currently 3lNC - trop - x3 - Consider consult to [employer] cards today, patient established with Dr. Jens Som  Hypertension - Cont home amlodipine, benazepril, cardura, hydralazine, and metoprolol  OSA - Home CPAP--> will need to care management to see about getting upgraded machine or supplies.   Chronic MSK Pain - Cont home oxycodone 30 mg IR  DM, Type 2 - Last A1C 7.0 in feb 2014  - Hold metformin given contrast dye, SSI  Depression - Cont home bupropion  FENGI - Carb modified diet  PPX - Heparin  DISPO - Admit to telemetry floor under Family Med Teaching Service, dispo pending clinical improvement and pulmonology and cards recommendations.

## 2013-03-04 NOTE — Progress Notes (Signed)
PULMONARY  / CRITICAL CARE MEDICINE  Name: [name] [name] MRN: [id number] DOB: 01/14/1956    ADMISSION DATE:  03/02/2013 CONSULTATION DATE:  03/03/13  REFERRING MD :  Dr. Jennette Kettle PRIMARY SERVICE:  FP TS  CHIEF COMPLAINT:  Dyspnea / Hypoxemia  BRIEF PATIENT DESCRIPTION: [name] with PMH of OSA (non-compliant as of last 6 mo), admitted with dyspnea / hypoxemia.  PCCM asked to evaluate 4/23.    SIGNIFICANT EVENTS / STUDIES:  4/22 - admit with dyspnea, hypoxemia  LINES / TUBES:   CULTURES:   ANTIBIOTICS:   BRIEF [name], former smoker (quit 02/16/13), DM, HLD, HTN, obesity, OSA  (sees DDr Maple Hudson but did not follow) who was admitted 4/22 per FP TS for a two week history of worsening shortness of breath.  He reports that in 12/2012 he was walking up to 5 miles per day on treadmill after a back surgery.  Most recently, he has had difficulty with exertion.  He quit using his CPAP machine approx 6 months ago because he felt it has not been working properly.  Also, he quit taking his home lasix dosing to keep from having to urinate at night and has noted increased lower extremity swelling, difficulty lying flat (2 pillows at baseline), increased abd swelling, and 10 lb weight gain.  He was seen in [facility] on 4/22 and noted to be hypoxic with sats of 75% with ambulation. ECHO in 11/2012 with EF of 55% and severe LVH.    Pt denies fevers, chills, feeling like an acute illness, n/v/d, cough with sputum production, chest pain, hemoptysis.      EVENTS 4/23 - Pt currently denies shortness of breath / sputum production / fevers / chills.     SUBJECTIVE/OVERNIGHT/INTERVAL HX 03/04/13 - feeling better. Agrees he needs to re-establish with Dr  Maple Hudson. Edema and wheeze better  VITAL SIGNS: Temp:  [98.2 F (36.8 C)-98.4 F (36.9 C)] 98.2 F (36.8 C) (04/24 0454) Pulse Rate:  [72-86] 73 (04/24 0454) Resp:  [18-20] 20 (04/24 0454) BP: (131-149)/(93-100) 144/100 mmHg (04/24 0454) SpO2:  [92 %-96 %]  92 % (04/24 0454) Weight:  [115.7 kg (255 lb 1.2 oz)] 115.7 kg (255 lb 1.2 oz) (04/24 0454)  PHYSICAL EXAMINATION: General:  wdwn adult male in NAD Neuro:  AAOx4, speech clear, MAE HEENT:  Mm pink/moist, dentures seem ill fitting in conversation  Cardiovascular:  s1s2 rrr, no m/r/g Lungs:  resp's even/non-labored, lungs bilaterally distant but clear thouh occ wheeze + Abdomen:  Obese/soft, bsx4 active Musculoskeletal:  No acute deformities Skin:  Warm/dry, 1+ LE edema   Recent Labs Lab 03/02/13 1749 03/03/13 0625 03/04/13 0430  NA 142 142 143  K 4.2 3.8 4.6  CL 109 107 106  CO2 24 25 26   BUN 14 14 19   CREATININE 1.30 1.35 1.53*  GLUCOSE 104* 100* 102*    Recent Labs Lab 03/02/13 1749 03/03/13 0625  HGB 15.5 15.5  HCT 44.1 43.8  WBC 5.4 5.8  PLT 130* 122*   Dg Chest 2 View  03/02/2013  *RADIOLOGY REPORT*  Clinical Data: Shortness of breath.  CHEST - 2 VIEW  Comparison: 09/11/2011 03/27/2011 chest radiographs  Findings: The cardiomediastinal silhouette is unremarkable. Pulmonary vascular congestion is identified. Mild interstitial opacities are suspicious for interstitial edema. Mild bibasilar atelectasis is noted. Elevation of the right hemidiaphragm is again noted. There is no evidence of pneumothorax or large pleural effusion.  IMPRESSION: Pulmonary vascular congestion with probable mild interstitial edema.  Mild  basilar atelectasis.   Original Report Authenticated By: Harmon Pier, M.D.    Ct Angio Chest Pe W/cm &/or Wo Cm  03/02/2013  *RADIOLOGY REPORT*  Clinical Data: Shortness of breath.  Rule out pulmonary embolism.  CT ANGIOGRAPHY CHEST  Technique:  Multidetector CT imaging of the chest using the standard protocol during bolus administration of intravenous contrast. Multiplanar reconstructed images including MIPs were obtained and reviewed to evaluate the vascular anatomy.  Contrast: OMNIPAQUE IOHEXOL 350 MG/ML SOLN  Comparison: Plain film of earlier in the day.   Chest CT of 12/21/2006.  Findings: Lung windows demonstrate motion degradation throughout. Moderate centrilobular emphysema.  Bibasilar volume loss.  Soft tissue windows:  The quality of this exam for evaluation of pulmonary embolism is poor.  In addition to the above described motion, the bolus is suboptimally timed, with the majority of the contrast in the SVC.  No central pulmonary embolism.  No large lobar embolism.  Low density bilateral thyroid nodules which are nonspecific.  Mild ascending aortic dilatation at 4.3 cm compared to 4.1 cm on the prior.  Moderate cardiomegaly, without pericardial effusion.  Pulmonary artery enlargement, with the outflow tract measuring 4.3 cm.  Increased number of small mediastinal nodes.  Right paratracheal adenopathy 1.7 cm on image 32/series 4. Subcarinal adenopathy 1.9 cm on image 44/series 4.  Right hilar adenopathy at 2.9 cm.  Limited abdominal imaging demonstrates no significant findings.  No acute osseous abnormality.  IMPRESSION:  1.  Severely degraded evaluation for pulmonary embolism.  No large embolism identified. 2.  Otherwise, motion degraded exam.  Centrilobular emphysema without definite pulmonary parenchymal process. 3.  Development of thoracic adenopathy.  Although this could be reactive, lymphoproliferative process cannot be excluded.  Consider non emergent PET.  If the patient has clinical congestive heart failure, short-term follow-up after appropriate diuresis could exclude adenopathy secondary to congestive failure. 4. Pulmonary artery enlargement suggests pulmonary arterial hypertension. 5.  Mild ascending aortic ectasia.  The patient could not be rescanned secondary to renal insufficiency.   Original Report Authenticated By: Jeronimo Greaves, M.D.    Dg Chest Port 1 View  03/03/2013  *RADIOLOGY REPORT*  Clinical Data: Shortness of breath, pulmonary congestion.  PORTABLE CHEST - 1 VIEW  Comparison: CT chest and chest radiograph 03/02/2013.  Findings: Trachea  is midline.  Heart size stable.  Lungs are low in volume with diffuse mixed interstitial and air space disease, possibly minimally worsened from 03/02/2013.  No definite pleural fluid.  IMPRESSION: Probable pulmonary edema with slight worsening from 03/02/2013.   Original Report Authenticated By: Leanna Battles, M.D.     No results found for this basename: PHART, PCO2, PCO2ART, PO2, PO2ART, HCO3, TCO2, O2SAT,  in the last 168 hours   ASSESSMENT / PLAN:  Dyspnea  Hypoxemia OSA   Patient has had recent medical non-compliance with CPAP & diuretic regimen. No large PE identified on CTA (note motion on exam).  PAH noted on CTA, previous ECHO with EF of 55-60%, severe LVH.  Most likely his medical non-compliance is contributing factor for current dyspnea / hypoxemia.  03/04/13 - agree with above thesis. Doing better. Needs opd  OSA re-estabishment with Dr Maple Hudson  Plan: - await repeat ECHO this admit to eval for PAH -aggressive diuresis -initiate auto-set cpap while inpatient.  Believe he was on 14 cm of H20 at home.  -will need [facility] to review machine set up  -will need assessment for home O2 prior to discharge -follow up with sleep MD  as outpatient - Dr Maple Hudson    Dr. Kalman Shan, M.D., [facility].C.P Pulmonary and Critical Care Medicine Staff Physician [facility] Pager: [phone], If no answer or between  15:00h - 7:00h: call [phone]  03/04/2013 10:27 AM

## 2013-03-04 NOTE — Progress Notes (Signed)
Physical Therapy Discharge Patient Details Name: [name] [name] MRN: [id number] DOB: 07/10/1956 Today's Date: 03/04/2013 Time: 1340-1406 PT Time Calculation (min): 26 min  Patient discharged from PT services secondary to goals met and no further PT needs identified.  Please see latest therapy progress note for current level of functioning and progress toward goals.    Progress and discharge plan discussed with patient and/or caregiver: Patient/Caregiver agrees with plan  GP     Fabio Asa 03/04/2013, 2:57 PM

## 2013-03-04 NOTE — Progress Notes (Signed)
SATURATION QUALIFICATIONS: (This note is used to comply with regulatory documentation for home oxygen)  Patient Saturations on Room Air at Rest = 88%  Patient Saturations on Room Air while Ambulating = 84%  Patient Saturations on 4 Liters of oxygen while Ambulating = 89 to 93%  Please briefly explain why patient needs home oxygen:  Patient desaturates with activity. Ambulated patient on 4 liters O2; desaturated to 89%; rebounded with rest break to 93% on 4 liters.  Charlotte Crumb, PT DPT  [phone]

## 2013-03-04 NOTE — Progress Notes (Signed)
*  Preliminary Results* Bilateral lower extremity venous duplex completed. Bilateral lower extremities are negative for deep vein thrombosis. There is no evidence of Baker's cyst bilaterally.  03/04/2013 9:23 AM Gertie Fey, RDMS, RDCS

## 2013-03-05 ENCOUNTER — Encounter (HOSPITAL_COMMUNITY): Payer: Self-pay | Admitting: Family Medicine

## 2013-03-05 DIAGNOSIS — I509 Heart failure, unspecified: Secondary | ICD-10-CM | POA: Diagnosis present

## 2013-03-05 HISTORY — DX: Heart failure, unspecified: I50.9

## 2013-03-05 LAB — GLUCOSE, CAPILLARY
Glucose-Capillary: 104 mg/dL — ABNORMAL HIGH (ref 70–99)
Glucose-Capillary: 142 mg/dL — ABNORMAL HIGH (ref 70–99)
Glucose-Capillary: 163 mg/dL — ABNORMAL HIGH (ref 70–99)

## 2013-03-05 LAB — BASIC METABOLIC PANEL
CO2: 23 mEq/L (ref 19–32)
Chloride: 104 mEq/L (ref 96–112)
Creatinine, Ser: 1.57 mg/dL — ABNORMAL HIGH (ref 0.50–1.35)
GFR calc Af Amer: 55 mL/min — ABNORMAL LOW (ref >90)
Potassium: 3.4 mEq/L — ABNORMAL LOW (ref 3.5–5.1)
Sodium: 142 mEq/L (ref 135–145)

## 2013-03-05 MED ORDER — FUROSEMIDE 40 MG PO TABS
40.0000 mg | ORAL_TABLET | Freq: Two times a day (BID) | ORAL | Status: DC
  Administered 2013-03-05: 40 mg via ORAL
  Filled 2013-03-05 (×3): qty 1

## 2013-03-05 NOTE — Progress Notes (Addendum)
Patient ID: [name] [name], male   DOB: 06-22-1956, 57 y.o.   MRN: [id number] Family Medicine Teaching Service Daily Progress Note Service Page: [phone]   Subjective:  Patient reports he is feeling a little better today, breathing a little easier. He has not been wearing his CPAP at night because the mask we have in hospital breaks out his skin. He is aware now, of the need to wear CPAP at night and will have his wife get his mask from home. He was told my RT that other masks are not available here. He feels like he is starting to get "cabin fever" and would really like to walk. No BM since admission.   Objective: Temp:  [98.1 F (36.7 C)-98.7 F (37.1 C)] 98.7 F (37.1 C) (04/25 0506) Pulse Rate:  [73-83] 83 (04/25 0506) Cardiac Rhythm:  [-] Heart block (04/24 1908) Resp:  [18-20] 20 (04/25 0506) BP: (132-146)/(92-98) 143/98 mmHg (04/25 0506) SpO2:  [90 %-100 %] 94 % (04/25 0506) Weight:  [256 lb 11.2 oz (116.438 kg)] 256 lb 11.2 oz (116.438 kg) (04/25 0506)  Intake/Output Summary (Last 24 hours) at 03/05/13 0759 Last data filed at 03/05/13 0520  Gross per 24 hour  Intake    480 ml  Output   2000 ml  Net  -1520 ml   Weight change: 1 lb 10 oz (0.738 kg)  Exam: Gen: Looks more tired today.  Without the ability to speak full sentences without stopping for breath, slightly improved. Currently on 3LNC. HEENT: NCAT, EOMI CV: RRR. No murmur Resp: CTAB, labored, good air movement. No wheezing or rhonchi. Mild crackles RLL.  Abd: Soft. NTND. BS present Ext: No erythema, trace edema. +2/4 pulses bilaterally.  Skin: No rashes noted. Well perfused. Neuro: Alert and oriented, No gross deficits  I have reviewed the patient's medications, labs, imaging, and diagnostic testing.  Notable results are summarized below.  CMP     Component Value Date/Time   NA 142 03/05/2013 0445   K 3.4* 03/05/2013 0445   CL 104 03/05/2013 0445   CO2 23 03/05/2013 0445   GLUCOSE 119* 03/05/2013 0445   BUN  25* 03/05/2013 0445   CREATININE 1.57* 03/05/2013 0445   CREATININE 1.27 12/16/2012 0951   CALCIUM 8.7 03/05/2013 0445   PROT 6.5 12/16/2012 0951   ALBUMIN 3.7 12/16/2012 0951   AST 12 12/16/2012 0951   ALT 8 12/16/2012 0951   ALKPHOS 82 12/16/2012 0951   BILITOT 0.6 12/16/2012 0951   GFRNONAA 48* 03/05/2013 0445   GFRAA 55* 03/05/2013 0445    03/02/2013   CXR IMPRESSION: Pulmonary vascular congestion with probable mild interstitial edema.  Mild basilar atelectasis.   Original Report Authenticated By: Harmon Pier, M.D.    Ct Angio Chest Pe W/cm &/or Wo Cm  03/02/2013    IMPRESSION:  1.  Severely degraded evaluation for pulmonary embolism.  No large embolism identified. 2.  Otherwise, motion degraded exam.  Centrilobular emphysema without definite pulmonary parenchymal process. 3.  Development of thoracic adenopathy.  Although this could be reactive, lymphoproliferative process cannot be excluded.  Consider non emergent PET.  If the patient has clinical congestive heart failure, short-term follow-up after appropriate diuresis could exclude adenopathy secondary to congestive failure. 4. Pulmonary artery enlargement suggests pulmonary arterial hypertension. 5.  Mild ascending aortic ectasia.  The patient could not be rescanned secondary to renal insufficiency.   Original Report Authenticated By: Jeronimo Greaves, M.D.    Assessment/Plan: [name] [name] is a 57 y.o. year  old male presenting with hypoxemia and dyspnea concerning for a CHF exacerbation vs pulmonary pathology. Pulmonology was consulted yesterday and feels it is CHF exacerbation and advised for "aggrresive diuresis." We thank them for their recommendations. There has been no major improvement overnight, still requiring oxygen supplementation of 3LNC, he is not on home oxygen. He is diuresing with weight loss >5lbs and an additional 1.25L over 24 hours.   Dyspea/Hypoxemia - Pulmonology consulted and are not concerned for lung pathology and have recommenced  aggressive diuresis. We thank them for their recommendations.  - Lasix IV continued today at 80mg  IV until oxygen requirement is not needed and he is back to baseline.  - Cont home beta blocker and ACE-I  - 4/24 ECHO: 40-45% with moderate LVH and calcified mitral valve, mildly dilated right atrium (last echo 55% with severe LVH)  - O2 as needed --> currently 3lNC - trop - x3 - Consider consult to [employer] cards today, patient established with Dr. Jens Som  Hypertension - Cont home amlodipine, benazepril, cardura, hydralazine, and metoprolol  OSA - Home CPAP--> will need to care management to see about getting upgraded machine or supplies.   Chronic MSK Pain - Cont home oxycodone 30 mg IR  DM, Type 2 - Last A1C 7.0 in feb 2014  - Hold metformin given contrast dye, SSI  Depression - Cont home bupropion  FENGI - Carb modified diet  PPX - Heparin  DISPO - Admit to telemetry floor under Family Med Teaching Service, dispo pending clinical improvement and pulmonology and cards recommendations.   Note was accidentally deleted, by error. This is the progress not from 03/04/2013.

## 2013-03-05 NOTE — Progress Notes (Signed)
I have seen and examined this patient. I have discussed with Dr Claiborne Billings.  I agree with their findings and plans as documented in their discharge note.

## 2013-03-05 NOTE — Progress Notes (Signed)
PULMONARY  / CRITICAL CARE MEDICINE  Name: [name] [name] MRN: [id number] DOB: 12-18-1955    ADMISSION DATE:  03/02/2013 CONSULTATION DATE:  03/03/13  REFERRING MD :  Dr. Jennette Kettle PRIMARY SERVICE:  FP TS  CHIEF COMPLAINT:  Dyspnea / Hypoxemia  BRIEF PATIENT DESCRIPTION: [name] with PMH of OSA (non-compliant as of last 6 mo), admitted with dyspnea / hypoxemia.  PCCM asked to evaluate 4/23.    SIGNIFICANT EVENTS / STUDIES:  4/22 - admit with dyspnea, hypoxemia  LINES / TUBES:   CULTURES:   ANTIBIOTICS:   BRIEF [name], former smoker (quit 02/16/13), DM, HLD, HTN, obesity, OSA  (sees DDr Maple Hudson but did not follow) who was admitted 4/22 per FP TS for a two week history of worsening shortness of breath.  He reports that in 12/2012 he was walking up to 5 miles per day on treadmill after a back surgery.  Most recently, he has had difficulty with exertion.  He quit using his CPAP machine approx 6 months ago because he felt it has not been working properly.  Also, he quit taking his home lasix dosing to keep from having to urinate at night and has noted increased lower extremity swelling, difficulty lying flat (2 pillows at baseline), increased abd swelling, and 10 lb weight gain.  He was seen in [facility] on 4/22 and noted to be hypoxic with sats of 75% with ambulation. ECHO in 11/2012 with EF of 55% and severe LVH.    Pt denies fevers, chills, feeling like an acute illness, n/v/d, cough with sputum production, chest pain, hemoptysis.      EVENTS     SUBJECTIVE/OVERNIGHT/INTERVAL HX 03/05/13 - feeling better. Agrees he needs to re-establish with Dr  Maple Hudson. Edema and wheeze better  VITAL SIGNS: Temp:  [98.1 F (36.7 C)-98.7 F (37.1 C)] 98.7 F (37.1 C) (04/25 0506) Pulse Rate:  [73-83] 83 (04/25 0506) Resp:  [18-20] 20 (04/25 0506) BP: (132-146)/(92-98) 143/98 mmHg (04/25 0506) SpO2:  [90 %-100 %] 96 % (04/25 0812) Weight:  [116.438 kg (256 lb 11.2 oz)] 116.438 kg (256 lb 11.2 oz)  (04/25 0506)  PHYSICAL EXAMINATION: General:  wdwn adult male in NAD Neuro:  AAOx4, speech clear, MAE HEENT:  Mm pink/moist, dentures seem ill fitting in conversation  Cardiovascular:  s1s2 rrr, no m/r/g Lungs:  resp's even/non-labored, lungs bilaterally distant but clear thouh occ wheeze + Abdomen:  Obese/soft, bsx4 active Musculoskeletal:  No acute deformities Skin:  Warm/dry, 1+ LE edema   Recent Labs Lab 03/03/13 0625 03/04/13 0430 03/05/13 0445  NA 142 143 142  K 3.8 4.6 3.4*  CL 107 106 104  CO2 25 26 23   BUN 14 19 25*  CREATININE 1.35 1.53* 1.57*  GLUCOSE 100* 102* 119*    Recent Labs Lab 03/02/13 1749 03/03/13 0625  HGB 15.5 15.5  HCT 44.1 43.8  WBC 5.4 5.8  PLT 130* 122*   No results found.  No results found for this basename: PHART, PCO2, PCO2ART, PO2, PO2ART, HCO3, TCO2, O2SAT,  in the last 168 hours Study Conclusions 2d 4-24  - Left ventricle: The cavity size was normal. Wall thickness was increased in a pattern of moderate LVH. Systolic function was normal. The estimated ejection fraction was in the range of 55% to 60%. - Mitral valve: Calcified annulus. Mildly thickened leaflets . - Left atrium: The atrium was mildly dilated. - Atrial septum: No defect or patent foramen ovale was identified. Echocardiography. M-mode, complete 2D, spectral  Doppler, and color Doppler. Height: Height: 182.9cm. Height: 72in. Weight: Weight: 121.1kg. Weight: 266.4lb. Body mass index: BMI: 36.2kg/m^2. Body surface area: BSA: 2.1m^2. Blood pressure: 159/93. Patient status: Outpatient. Location: [place] [facility]     ASSESSMENT / PLAN:  Dyspnea  Hypoxemia OSA   Patient has had recent medical non-compliance with CPAP & diuretic regimen. No large PE identified on CTA (note motion on exam).  PAH noted on CTA, previous ECHO with EF of 55-60%, severe LVH.  Most likely his medical non-compliance is contributing factor for current dyspnea / hypoxemia.  Plan: - await  repeat ECHO this admit to eval for PAH. Neg per report -aggressive diuresis -initiate auto-set cpap while inpatient.  Believe he was on 14 cm of H20 at home.  -will need [facility] to review machine set up  -will need assessment for home O2 prior to discharge -follow up with sleep MD as outpatient - Dr Maple Hudson -Appointment made for may 21st 1045 am with Dr. Maple Hudson.on 4-25 @10 :45 am. (sm) place in dc appointment print out. PCCM will be available PRN, will sign off.   [name] ACNP [facility] PCCM Pager [phone] till 3 pm If no answer page [phone] 03/05/2013, 9:39 AM  I have interviewed and examined the patient and reviewed the database. I have formulated the assessment and plan as reflected in the note above with amendments made by me.    Billy Fischer, MD;  PCCM service; Mobile [phone]

## 2013-03-05 NOTE — Progress Notes (Signed)
SATURATION QUALIFICATIONS: (This note is used to comply with regulatory documentation for home oxygen)  Patient Saturations on Room Air at Rest = 96%  Patient Saturations on Room Air while Ambulating = 95%  Patient Saturations on 0 Liters of oxygen while Ambulating = %  Please briefly explain why patient needs home oxygen:  Pt tolerated ambulation well on RA; no O2 needs at this time; will cont. To monitor.

## 2013-03-05 NOTE — Progress Notes (Signed)
Pt to d/c home with wife; IV's and tele monitor d/c at this time; pt given d/c instructions; pt verbalized understanding; will cont. To monitor; pt to have outpt sleep study done in order to receive new CPAP machine; pt encouraged to call and make appointment this afternoon.

## 2013-03-05 NOTE — Progress Notes (Signed)
Patient ID: [name] [name], male   DOB: 10-21-1956, 57 y.o.   MRN: [id number] Family Medicine Teaching Service Daily Progress Note Service Page: [phone]   Subjective:  [name] [name] states he is doing "really good" this morning. He is feeling much better than he did at home and now realizes how bad he was breathing. He wore his CPAP last night, with his mask from home and tolerated it well. He walked with PT yesterday.  Objective: Temp:  [98.1 F (36.7 C)-98.7 F (37.1 C)] 98.7 F (37.1 C) (04/25 0506) Pulse Rate:  [73-83] 83 (04/25 0506) Cardiac Rhythm:  [-] Heart block (04/24 1908) Resp:  [18-20] 20 (04/25 0506) BP: (132-146)/(92-98) 143/98 mmHg (04/25 0506) SpO2:  [90 %-100 %] 94 % (04/25 0506) Weight:  [256 lb 11.2 oz (116.438 kg)] 256 lb 11.2 oz (116.438 kg) (04/25 0506)  Intake/Output Summary (Last 24 hours) at 03/05/13 0758 Last data filed at 03/05/13 0520  Gross per 24 hour  Intake    480 ml  Output   2000 ml  Net  -1520 ml   Weight change: 1 lb 10 oz (0.738 kg) elevated today   Exam: Gen: Currently on room air. Breathing well. Pleasant, reading paper. HEENT: NCAT, EOMI CV: RRR. No murmur Resp: CTAB,  good air movement. No wheezing, crackles or rhonchi. Not labored today. Abd: Soft. NTND. BS present Ext: No erythema, trace edema right, +1 edema left (hanging off of bed). +2/4 pulses bilaterally.  Skin: No rashes noted. Well perfused. Neuro: Alert and oriented, No gross deficits  I have reviewed the patient's medications, labs, imaging, and diagnostic testing.  Notable results are summarized below.   CMP     Component Value Date/Time   NA 142 03/05/2013 0445   K 3.4* 03/05/2013 0445   CL 104 03/05/2013 0445   CO2 23 03/05/2013 0445   GLUCOSE 119* 03/05/2013 0445   BUN 25* 03/05/2013 0445   CREATININE 1.57* 03/05/2013 0445   CREATININE 1.27 12/16/2012 0951   CALCIUM 8.7 03/05/2013 0445   PROT 6.5 12/16/2012 0951   ALBUMIN 3.7 12/16/2012 0951   AST 12 12/16/2012 0951   ALT 8 12/16/2012 0951   ALKPHOS 82 12/16/2012 0951   BILITOT 0.6 12/16/2012 0951   GFRNONAA 48* 03/05/2013 0445   GFRAA 55* 03/05/2013 0445     03/02/2013   CXR IMPRESSION: Pulmonary vascular congestion with probable mild interstitial edema.  Mild basilar atelectasis.   Original Report Authenticated By: Harmon Pier, M.D.    Ct Angio Chest Pe W/cm &/or Wo Cm  03/02/2013    IMPRESSION:  1.  Severely degraded evaluation for pulmonary embolism.  No large embolism identified. 2.  Otherwise, motion degraded exam.  Centrilobular emphysema without definite pulmonary parenchymal process. 3.  Development of thoracic adenopathy.  Although this could be reactive, lymphoproliferative process cannot be excluded.  Consider non emergent PET.  If the patient has clinical congestive heart failure, short-term follow-up after appropriate diuresis could exclude adenopathy secondary to congestive failure. 4. Pulmonary artery enlargement suggests pulmonary arterial hypertension. 5.  Mild ascending aortic ectasia.  The patient could not be rescanned secondary to renal insufficiency.   Original Report Authenticated By: Jeronimo Greaves, M.D.    Assessment/Plan: [name] [name] is a 57 y.o. year old male presenting with hypoxemia and dyspnea concerning for a CHF exacerbation vs pulmonary pathology.   Dyspea/Hypoxemia - Possibly CHF exacerbation given history of non compliance with Lasix and pulm edema on chest X-ray, but symptoms seem out of  proportion of recent echo or rest of physical exam; Pulm embolism unlikely but still possible, also noted to have emphysema on CTA, but unlikely to have such acute presentation.  Concern for the development of thoracic adenopathy with patients smoking history. Weight on admission was 261, weight decreased with diuresis initially but gained a pound overnight. -  Lasix 80 mg IV x2 yesterday, strict I/O --> 1.5L output over night - Cont home beta blocker and ACE-I  - 2D Echo 40- 45% (last echo 55% with  severe LVH)  - O2 as needed --> currently 2L CPAP;  - Courtesy call to  [facility] today--> patient of Dr. Jens Som - PT has signed off, we thank them for their recommendations. No outpatient PT. Patient ambulated, desaturations of 84%, on RA he desaturated to 88% at rest. If no improvement will contact case management for home O2 set up assistance.  - Replete ing potassium this morning.   Hypertension - Cont home amlodipine, benazepril, cardura, hydralazine, and metoprolol  OSA - Home CPAP--> will need to care management to see about getting upgraded machine or supplies.   Chronic MSK Pain - Cont home oxycodone 30 mg IR   DM, Type 2 - Last A1C 7.0 in feb 2014  - Hold metformin given contrast dye, SSI  Depression - Cont home bupropion   FENGI - Carb modified diet  PPX - Heparin  DISPO - Possible discharge this afternoon or tomorrow morning. Patient is ambulating with nursing this morning, with oximetry, probable need for home oxygen to be arranged. PT documented desaturations with ambulation yesterday.  F/U: Will need to follow up with Dr. Maple Hudson for OSA and Primary for Thoracic adenopathy.

## 2013-03-05 NOTE — Clinical Documentation Improvement (Signed)
CHF DOCUMENTATION CLARIFICATION QUERY  THIS DOCUMENT IS NOT A PERMANENT PART OF THE MEDICAL RECORD  TO RESPOND TO THE THIS QUERY, FOLLOW THE INSTRUCTIONS BELOW:  1. If needed, update documentation for the patient's encounter via the notes activity.  2. Access this query again and click edit on the In [employer].  3. After updating, or not, click F2 to complete all highlighted (required) fields concerning your review. Select "additional documentation in the medical record" OR "no additional documentation provided".  4. Click Sign note button.  5. The deficiency will fall out of your In Basket *Please let us know if you are not able to complete this workflow by phone or e-mail (listed below).  Please update your documentation within the medical record to reflect your response to this query.                                                                                    03/05/13  Dear Dr. Jennette Kettle / Associates,  In a better effort to capture your patient's severity of illness, reflect appropriate length of stay and utilization of resources, a review of the patient medical record has revealed the following indicators the diagnosis of Heart Failure.    Based on your clinical judgment, please clarify and document in a progress note and/or discharge summary the clinical condition associated with the following supporting information:  In responding to this query please exercise your independent judgment.  The fact that a query is asked, does not imply that any particular answer is desired or expected.   Possible Clinical Conditions?   Acute Systolic Congestive Heart Failure  Acute Diastolic Congestive Heart Failure  Acute Systolic & Diastolic Congestive Heart Failure  Acute on Chronic Systolic Congestive Heart Failure  Acute on Chronic Diastolic Congestive Heart Failure  Acute on Chronic Systolic & Diastolic  Congestive Heart Failure  Other Condition  Cannot Clinically  Determine     Supporting Information:  Risk Factors: Possible CHF exacerbation noted per 4/25 progress notes.  Diagnostics: 4/22:   ProBNP: 509.1  Treatment: 4/24:  Lasix 80mg  IV x2 4/25:  Lasix 40mg  tab 2x daily. He does NOT have known heart failure.  This was an assumption by the admitting resident and it was incorrect. Ndiagnosis of diastolic or systolic heart failure. THANKS!   Reviewed:  no additional documentation provided  Thank You,  Marciano Sequin,  Clinical Documentation Specialist:  Phone: [phone]  Health Information Management [place]

## 2013-03-08 NOTE — Discharge Summary (Signed)
I have seen and examined this patient. I have discussed with Dr Kuneff.  I agree with their findings and plans as documented in their discharge note.    

## 2013-03-12 ENCOUNTER — Other Ambulatory Visit: Payer: Self-pay | Admitting: Cardiology

## 2013-03-17 ENCOUNTER — Encounter: Payer: Self-pay | Admitting: Family Medicine

## 2013-03-17 ENCOUNTER — Ambulatory Visit (INDEPENDENT_AMBULATORY_CARE_PROVIDER_SITE_OTHER): Payer: Medicare Other | Admitting: Family Medicine

## 2013-03-17 VITALS — BP 130/80 | HR 72 | Temp 98.3°F | Ht 72.0 in | Wt 262.4 lb

## 2013-03-17 DIAGNOSIS — I2510 Atherosclerotic heart disease of native coronary artery without angina pectoris: Secondary | ICD-10-CM

## 2013-03-17 NOTE — Patient Instructions (Addendum)
You look MUCH IMPROVED! I want you to continue to take care of YOURSELF as well as your family. Eat right, walk for exercise and take your medicines!

## 2013-03-17 NOTE — Progress Notes (Signed)
  Subjective:    Patient ID: [name] [name], male    DOB: [birth year], 57 y.o.   MRN: [id number]  HPI  Followup recent hospitalization for dyspnea that was likely diagnosed as worsening hypoxemia secondary to pulmonary condition. He had initially been admitted with diagnosis of congestive heart failure but it was determined he did not really have to.  Since returning home he felt much better. His blood pressures been better controlled and in many years. He has gotten his CPAP machine set up and that's working well. He feels essentially great! He has followup appointment with the pulmonologist specialist in and sleep disorders. He has lost a little bit of weight.  He states he always medicines without problem. He continues to have some back and leg pain from his recent surgical procedures but even that seems to be improving.  Review of Systems See history of present illness.    Objective:   Physical Exam  Vital signs are reviewed GENERAL: Well-developed overweight male no acute distress CARDIOVASCULAR: Regular rate and rhythm without murmur LUNGS: Clear to auscultation bilaterally ABDOMEN: Soft positive bowel sounds nontender nondistended EXTREMITY: No edema      Assessment & Plan:  Hospital followup. He seems to be doing really well. He has followup with the pulmonary sleep specialist and he has gotten the CPAP set up at home. His blood pressure is better controlled. We talked about continuing to take care of himself, exercise, eating right, following appropriate diet I will see him back in 2-3 months, sooner with problems.

## 2013-03-19 ENCOUNTER — Telehealth: Payer: Self-pay | Admitting: Cardiology

## 2013-03-19 NOTE — Telephone Encounter (Signed)
New problem    Needing pts latest EF percent

## 2013-03-19 NOTE — Telephone Encounter (Signed)
Returned call from [facility]. Advised EF% from 03/03/13 = 40-45%.

## 2013-03-31 ENCOUNTER — Encounter: Payer: Self-pay | Admitting: Internal Medicine

## 2013-03-31 ENCOUNTER — Ambulatory Visit (INDEPENDENT_AMBULATORY_CARE_PROVIDER_SITE_OTHER): Payer: Medicare Other | Admitting: Internal Medicine

## 2013-03-31 VITALS — BP 122/82 | HR 80 | Ht 71.0 in | Wt 265.0 lb

## 2013-03-31 DIAGNOSIS — J432 Centrilobular emphysema: Secondary | ICD-10-CM

## 2013-03-31 DIAGNOSIS — F17200 Nicotine dependence, unspecified, uncomplicated: Secondary | ICD-10-CM

## 2013-03-31 DIAGNOSIS — R599 Enlarged lymph nodes, unspecified: Secondary | ICD-10-CM

## 2013-03-31 DIAGNOSIS — G4733 Obstructive sleep apnea (adult) (pediatric): Secondary | ICD-10-CM

## 2013-03-31 DIAGNOSIS — R590 Localized enlarged lymph nodes: Secondary | ICD-10-CM

## 2013-03-31 DIAGNOSIS — J438 Other emphysema: Secondary | ICD-10-CM

## 2013-03-31 NOTE — Progress Notes (Signed)
03/31/13- 57 yoM smoker Concerned about dyspnea. Self referral-former [place] chest patient-currently uses CPAP; Pt's O2 level was 78% RA upon entering Exam room; placed on 2L/M O2 continous recked-sat of 94%   Wife here. Recent hospitalization for CHF after stopping diuretic. Was discharged on room air. Has become more short of breath, interfering with sleep. History of obstructive sleep apnea on CPAP 10/Advanced with nasal mask. Bedtime 11:50 PM, 30 minute sleep latency, waking 4 times before up at 6:30. Has lost 20 pounds in the last 2 years. Echocardiogram 03/03/2013 showed EF 40-45%. Still smoking 1 pack per day. Disability. Family history of heart disease. CT chest 4/ 22  /14 IMPRESSION:  1. Severely degraded evaluation for pulmonary embolism. No large  embolism identified.  2. Otherwise, motion degraded exam. Centrilobular emphysema  without definite pulmonary parenchymal process.  3. Development of thoracic adenopathy. Although this could be  reactive, lymphoproliferative process cannot be excluded. Consider  non emergent PET. If the patient has clinical congestive heart  failure, short-term follow-up after appropriate diuresis could  exclude adenopathy secondary to congestive failure.  4. Pulmonary artery enlargement suggests pulmonary arterial  hypertension.  5. Mild ascending aortic ectasia.  The patient could not be rescanned secondary to renal  insufficiency.  Original Report Authenticated By: Jeronimo Greaves, M.D.  Prior to Admission medications   Medication Sig Start Date End Date Taking? Authorizing Provider  allopurinol (ZYLOPRIM) 100 MG tablet Take 1 tablet (100 mg total) by mouth daily. 01/13/13  Yes Nestor Ramp, MD  amLODipine (NORVASC) 10 MG tablet Take 1 tablet (10 mg total) by mouth every morning. 04/30/12  Yes Lewayne Bunting, MD  benazepril (LOTENSIN) 40 MG tablet Take 1 tablet (40 mg total) by mouth daily. 04/30/12  Yes Lewayne Bunting, MD  buPROPion (WELLBUTRIN XL) 150  MG 24 hr tablet Take 1 tablet (150 mg total) by mouth daily. 12/16/12  Yes Nestor Ramp, MD  doxazosin (CARDURA) 8 MG tablet Take 1 tablet (8 mg total) by mouth daily. 04/30/12  Yes Lewayne Bunting, MD  furosemide (LASIX) 80 MG tablet Take 0.5-1 tablets (40-80 mg total) by mouth daily as needed. for lower extremity edema 04/30/12  Yes Lewayne Bunting, MD  hydrALAZINE (APRESOLINE) 100 MG tablet Take 100 mg by mouth 3 (three) times daily.   Yes Historical Provider, MD  hydrALAZINE (APRESOLINE) 100 MG tablet TAKE ONE TABLET BY MOUTH THREE TIMES DAILY 03/12/13  Yes Lewayne Bunting, MD  metFORMIN (GLUCOPHAGE) 1000 MG tablet Take 1,000 mg by mouth 2 (two) times daily with a meal.   Yes Historical Provider, MD  metoprolol (LOPRESSOR) 100 MG tablet Take 1 tablet (100 mg total) by mouth 2 (two) times daily. 04/30/12  Yes Lewayne Bunting, MD  oxycodone (ROXICODONE) 30 MG immediate release tablet Take one tablet every 6 hours as needed for back pain. Do not fill before sixty days from the date of this prescription 01/13/13  Yes Nestor Ramp, MD  potassium chloride (K-DUR) 10 MEQ tablet Take 1 tablet (10 mEq total) by mouth daily. 04/30/12  Yes Lewayne Bunting, MD  simvastatin (ZOCOR) 20 MG tablet Take 1 tablet (20 mg total) by mouth daily. 04/30/12  Yes Lewayne Bunting, MD   Past Medical History  Diagnosis Date  . Renal insufficiency, mild   . OA (osteoarthritis) of knee     right; mod-severe  . DM (diabetes mellitus), type 2     mild mod sensory loss plantar feet  .  OA (osteoarthritis)   . History of colonoscopy   . Peripheral autonomic neuropathy due to secondary diabetes   . Hypothyroidism     since thyroidectomy  . Anxiety     takes buspar  . Sleep apnea     on cpap  . Complication of anesthesia     pt. reports he "flat lined " during surgery in  1999, told that  he had to be revived .  Pt. reports that it was surg. to replace his knee & [facility] [place].  Call to [facility]- no record avail. on this pt.   Marland Kitchen HTN (hypertension)     labile, followed by Dr. Jens Som  . Diastolic dysfunction   . Renal insufficiency   . CHF exacerbation 03/05/2013   Past Surgical History  Procedure Laterality Date  . Total knee arthroplasty  2001    left  . Thyroidectomy, partial      remote  . Cardiolite--no cad ef 34%  01/01/2005  . Joint replacement  2000    left knee   . Lumbar laminectomy/decompression microdiscectomy  09/25/2011    Procedure: LUMBAR LAMINECTOMY/DECOMPRESSION MICRODISCECTOMY;  Surgeon: Karn Cassis;  Location: [facility];  Service: Neurosurgery;  Laterality: Right;  Right Lumbar Five-Sacral One Microdiscectomy  . Back surgery      11/12  . Lumbar laminectomy/decompression microdiscectomy  01/03/2012    Procedure: LUMBAR LAMINECTOMY/DECOMPRESSION MICRODISCECTOMY;  Surgeon: Karn Cassis, MD;  Location: [facility];  Service: Neurosurgery;  Laterality: Right;  Right L5-S1 Redo Diskectomy   Family History  Problem Relation Age of Onset  . Diabetes Mother   . Hypertension Mother   . Aneurysm Mother   . Anesthesia problems Neg Hx   . Hypotension Neg Hx   . Malignant hyperthermia Neg Hx   . Pseudochol deficiency Neg Hx   ' History   Social History  . Marital Status: Married    Spouse Name: Towanna    Number of Children: 4  . Years of Education: some colle   Occupational History  . Retired-printing    Social History Main Topics  . Smoking status: Current Some Day Smoker -- 1.00 packs/day for 28 years    Types: Cigarettes    Last Attempt to Quit: 02/23/2013  . Smokeless tobacco: Never Used     Comment: wants to get back pain under control then wants to discuss  . Alcohol Use: No  . Drug Use: No  . Sexually Active: Not on file   Other Topics Concern  . Not on file   Social History Narrative   Health Care POA:    Emergency Contact: wife, Cassell Clement [phone]   End of Life Plan:    Who lives with you: wife   Any pets: none    Diet: Pt has a varied diet of protein, starch, and vegetables.    Exercise: Pt reports exercising 1-3x a week at [facility].   Seatbelts: Pt reports wearing seatbelt when in vehicles.    Hobbies: fishing with cousin   ROS-see HPI Constitutional:   No-   weight loss, night sweats, fevers, chills, fatigue, lassitude. HEENT:   No-  headaches, difficulty swallowing, tooth/dental problems, sore throat,       No-  sneezing, itching, ear ache, +nasal congestion, post nasal drip,  CV:  No-   chest pain, orthopnea, PND, +swelling in lower extremities, anasarca,  dizziness, palpitations Resp: +  shortness of breath with exertion or at rest.              No-   productive cough,  No non-productive cough,  No- coughing up of blood.              No-   change in color of mucus.  No- wheezing.   Skin: No-   rash or lesions. GI:  No-   heartburn, indigestion, abdominal pain, nausea, vomiting, diarrhea,                 change in bowel habits, loss of appetite GU: No-   dysuria, change in color of urine, no urgency or frequency.  No- flank pain. MS:  No-   joint pain or swelling.  No- decreased range of motion.  No- back pain. Neuro-     nothing unusual Psych:  No- change in mood or affect. No depression, +anxiety.  No memory loss.  OBJ- Physical Exam General- Alert, Oriented, Affect-appropriate, Distress- none acute. + dark nailbeds may falsely           reduce oximetry scores. Skin- rash-none, lesions- none, excoriation- none Lymphadenopathy- none Head- atraumatic            Eyes- Gross vision intact, PERRLA, conjunctivae and secretions clear            Ears- Hearing, canals-normal            Nose- Clear, no-Septal dev, mucus, polyps, erosion, perforation             Throat- Mallampati II , mucosa clear , drainage- none, tonsils- atrophic Neck- flexible , trachea midline, no stridor , thyroid nl, carotid no bruit Chest - symmetrical excursion , unlabored            Heart/CV- RRR , no murmur , no gallop  , no rub, nl s1 s2                           - JVD- none , edema- none, stasis changes- none, varices- none           Lung- clear to P&A, wheeze- none, cough- none , dullness-none, rub- none           Chest wall-  Abd- tender-no, distended-no, bowel sounds-present, HSM- no Br/ Gen/ Rectal- Not done, not indicated Extrem- cyanosis- none, clubbing, none, atrophy- none, strength- nl. + very darkly pigmented      fingernails seem to affect oximetry. Neuro- grossly intact to observation

## 2013-03-31 NOTE — Patient Instructions (Addendum)
Recheck room air resting oximetry on ear lobe  Order- DME Advanced autotitrate CPAP  5-15 cwp  For pressure recommendation    Dx OSA

## 2013-04-11 DIAGNOSIS — R590 Localized enlarged lymph nodes: Secondary | ICD-10-CM | POA: Insufficient documentation

## 2013-04-11 NOTE — Assessment & Plan Note (Signed)
We need to find diagnostic sleep study for our records. He has been on 10 CWP. Plan-DME Advanced auto titration for pressure recommendation

## 2013-04-11 NOTE — Assessment & Plan Note (Signed)
We have begun a discussion of smoking cessation, which is critical for him.

## 2013-04-11 NOTE — Assessment & Plan Note (Signed)
Smoking history certainly sleep consistent with COPD. Plan-PFT and 6 minute walk test. We may need to judge oximetry from in the ear lobe, or get ABG because dark nailbeds give misleading scores.

## 2013-04-12 ENCOUNTER — Telehealth: Payer: Self-pay | Admitting: Family Medicine

## 2013-04-12 MED ORDER — OXYCODONE HCL 30 MG PO TABS: ORAL_TABLET | ORAL | Status: DC

## 2013-04-12 NOTE — Telephone Encounter (Signed)
Pt notified to pick up at [facility] per Dr. Jennette Kettle.  Emilie Rutter, Darlyne Russian, CMA

## 2013-04-12 NOTE — Telephone Encounter (Signed)
Dear [name] [name] Team He can pick this up this PM if he wants to [facility] to [facility]---itis at front desk [facility] plz let him know THANKS! Denny Levy

## 2013-04-12 NOTE — Telephone Encounter (Signed)
Will fwd to MD.  Leena Tiede L, CMA  

## 2013-04-12 NOTE — Telephone Encounter (Signed)
Patient is calling to speak to the nurse about his Rx for Oxycodone.  He needs a new Rx.  He is in a lot of pain.  He just needs to be called when it is ready to be picked up.

## 2013-04-14 ENCOUNTER — Encounter: Payer: Self-pay | Admitting: Internal Medicine

## 2013-04-14 ENCOUNTER — Other Ambulatory Visit (INDEPENDENT_AMBULATORY_CARE_PROVIDER_SITE_OTHER): Payer: Medicare Other

## 2013-04-14 ENCOUNTER — Ambulatory Visit (INDEPENDENT_AMBULATORY_CARE_PROVIDER_SITE_OTHER)
Admission: RE | Admit: 2013-04-14 | Discharge: 2013-04-14 | Disposition: A | Discharge status: Home / Self Care | Payer: Medicare Other | Source: Ambulatory Visit | Attending: Internal Medicine | Admitting: Internal Medicine

## 2013-04-14 ENCOUNTER — Telehealth: Payer: Self-pay | Admitting: *Deleted

## 2013-04-14 ENCOUNTER — Ambulatory Visit (INDEPENDENT_AMBULATORY_CARE_PROVIDER_SITE_OTHER): Payer: Medicare Other | Admitting: Internal Medicine

## 2013-04-14 VITALS — BP 162/100 | HR 84 | Ht 71.0 in | Wt 270.4 lb

## 2013-04-14 DIAGNOSIS — I509 Heart failure, unspecified: Secondary | ICD-10-CM

## 2013-04-14 DIAGNOSIS — G4733 Obstructive sleep apnea (adult) (pediatric): Secondary | ICD-10-CM

## 2013-04-14 DIAGNOSIS — R0600 Dyspnea, unspecified: Secondary | ICD-10-CM

## 2013-04-14 DIAGNOSIS — R0609 Other forms of dyspnea: Secondary | ICD-10-CM

## 2013-04-14 DIAGNOSIS — I2510 Atherosclerotic heart disease of native coronary artery without angina pectoris: Secondary | ICD-10-CM

## 2013-04-14 DIAGNOSIS — R0902 Hypoxemia: Secondary | ICD-10-CM

## 2013-04-14 DIAGNOSIS — F17200 Nicotine dependence, unspecified, uncomplicated: Secondary | ICD-10-CM

## 2013-04-14 LAB — BASIC METABOLIC PANEL
BUN: 15 mg/dL (ref 6–23)
Calcium: 8.9 mg/dL (ref 8.4–10.5)
Creatinine, Ser: 1.5 mg/dL (ref 0.4–1.5)
GFR: 64.17 mL/min (ref >60.00)
Glucose, Bld: 184 mg/dL — ABNORMAL HIGH (ref 70–99)

## 2013-04-14 LAB — D-DIMER, QUANTITATIVE: D-Dimer, Quant: 0.32 ug/mL-FEU (ref 0.00–0.48)

## 2013-04-14 NOTE — Patient Instructions (Addendum)
Order- lab    D-dimer, BNP BMET     CHF, Dyspnea  Order- CXR    CHF, mediastinal adenopnopathy on CT  Order- DME     Change CPAP to 10 cwp                   Dx OSA                           ONOX on room air with CPAP 10

## 2013-04-14 NOTE — Progress Notes (Signed)
03/31/13- 57 yoM smoker Concerned about dyspnea. Self referral-former [place] chest patient-currently uses CPAP; Pt's O2 level was 78% RA upon entering Exam room; placed on 2L/M O2 continous recked-sat of 94%   Wife here. Recent hospitalization for CHF after stopping diuretic. Was discharged on room air. Has become more short of breath, interfering with sleep. History of obstructive sleep apnea on CPAP 10/Advanced with nasal mask. Bedtime 11:50 PM, 30 minute sleep latency, waking 4 times before up at 6:30. Has lost 20 pounds in the last 2 years. Echocardiogram 03/03/2013 showed EF 40-45%. Still smoking 1 pack per day. Disability. Family history of heart disease. CT chest 4/ 22 /14 IMPRESSION:  1. Severely degraded evaluation for pulmonary embolism. No large  embolism identified.  2. Otherwise, motion degraded exam. Centrilobular emphysema  without definite pulmonary parenchymal process.  3. Development of thoracic adenopathy. Although this could be  reactive, lymphoproliferative process cannot be excluded. Consider  non emergent PET. If the patient has clinical congestive heart  failure, short-term follow-up after appropriate diuresis could  exclude adenopathy secondary to congestive failure.  4. Pulmonary artery enlargement suggests pulmonary arterial  hypertension.  5. Mild ascending aortic ectasia.  The patient could not be rescanned secondary to renal  insufficiency.  Original Report Authenticated By: Jeronimo Greaves, M.D.  04/14/13- 22 yoM smoker followed for COPD, abnl CT/adenopathy FOLLOW UP per CY-review CXR and CT scan done in April by another MD. Hospitalized April, 2014 for congestive heart failure. EF 40-45% on 03/03/2013. Continues CPAP/Advanced. He had been comfortable at 10; autotitration was too high. He has never had oxygen.  ROS-see HPI Constitutional:   No-   weight loss, night sweats, fevers, chills, fatigue, lassitude. HEENT:   No-  headaches, difficulty swallowing,  tooth/dental problems, sore throat,       No-  sneezing, itching, ear ache, +nasal congestion, post nasal drip,  CV:  No-   chest pain, orthopnea, PND, +swelling in lower extremities, anasarca,                                  dizziness, palpitations Resp: +  shortness of breath with exertion or at rest.              No-   productive cough,  No non-productive cough,  No- coughing up of blood.              No-   change in color of mucus.  No- wheezing.   Skin: No-   rash or lesions. GI:  No-   heartburn, indigestion, abdominal pain, nausea, vomiting,  GU:  MS:  No-   joint pain or swelling.   Neuro-     nothing unusual Psych:  No- change in mood or affect. No depression, +anxiety.  No memory loss.  OBJ- Physical Exam General- Alert, Oriented, Affect-appropriate, Distress- none acute. + dark nailbeds may falsely           reduce oximetry scores. Skin- rash-none, lesions- none, excoriation- none Lymphadenopathy- none Head- atraumatic            Eyes- Gross vision intact, PERRLA, conjunctivae and secretions clear            Ears- Hearing, canals-normal            Nose- Clear, no-Septal dev, mucus, polyps, erosion, perforation             Throat- Mallampati II , mucosa clear ,  drainage- none, tonsils- atrophic, +upper dentures Neck- flexible , trachea midline, no stridor , thyroid nl, carotid no bruit Chest - symmetrical excursion , unlabored           Heart/CV- RRR , no murmur , no gallop  , no rub, nl s1 s2                           - JVD- none , edema+1, stasis changes- none, varices- none           Lung- clear/ coarse breath sounds, wheeze- none, cough- none , dullness-none, rub- none           Chest wall-  Abd-  Br/ Gen/ Rectal- Not done, not indicated Extrem- cyanosis- none, clubbing, none, atrophy- none, strength- nl. + very darkly pigmented      fingernails seem to affect oximetry. Neuro- grossly intact to observation

## 2013-04-14 NOTE — Telephone Encounter (Signed)
Called and spoke with pt about his cxr results and pt asked about his cpap that he was to be set up for.  Looks like the order was given to [place] on 5/21.  Pt stated that he has not heard from anyone to get this set up.  i called and spoke with [facility] and she will research this and get back to [place].

## 2013-04-14 NOTE — Telephone Encounter (Signed)
[name] from [facility] returning call can be reached at [phone].[name] [name]

## 2013-04-14 NOTE — Telephone Encounter (Signed)
lmomtcb x1 for melissa 

## 2013-04-21 NOTE — Progress Notes (Signed)
Quick Note:  Pt aware of results. ______ 

## 2013-04-21 NOTE — Progress Notes (Signed)
Quick Note:  LMTCB ______ 

## 2013-04-25 NOTE — Assessment & Plan Note (Signed)
autotitration weight too high for comfort. He would like to go back to CPAP 10. Plan-CPAP 10

## 2013-04-25 NOTE — Assessment & Plan Note (Signed)
He is struggling with this. Counseling given

## 2013-04-25 NOTE — Assessment & Plan Note (Signed)
Mild peripheral edema. Plan- lab for BNP, BMET, D-dimer

## 2013-04-25 NOTE — Assessment & Plan Note (Addendum)
We're trying oximetry on ear lobe because of darkly pigmented nails. He may need ABG. Plan-overnight oximetry on room air, D-dimer

## 2013-05-10 ENCOUNTER — Telehealth: Payer: Self-pay | Admitting: Family Medicine

## 2013-05-10 MED ORDER — OXYCODONE HCL 30 MG PO TABS: ORAL_TABLET | ORAL | Status: DC

## 2013-05-10 NOTE — Telephone Encounter (Signed)
Dear Cliffton Asters Team plz let him know this is up front THANKS! Denny Levy

## 2013-05-10 NOTE — Telephone Encounter (Signed)
Will fwd to MD.  Evora Schechter L, CMA  

## 2013-05-10 NOTE — Telephone Encounter (Signed)
[name] [name] is calling for his oxycodone refill.  Will pick up Tuesday this week.

## 2013-05-10 NOTE — Telephone Encounter (Signed)
Pt already picked up. [name] [name], [name]

## 2013-05-20 ENCOUNTER — Other Ambulatory Visit: Payer: Self-pay

## 2013-05-24 ENCOUNTER — Ambulatory Visit: Payer: Medicare Other | Admitting: Internal Medicine

## 2013-05-25 ENCOUNTER — Telehealth: Payer: Self-pay | Admitting: *Deleted

## 2013-05-25 NOTE — Telephone Encounter (Signed)
Per Dr. Maple Hudson: pt needs OV soon to discuss recent ONO and chest scans/cxr's ordered in the ER.    ----  Called, spoke with pt.  We have scheduled him to see Dr. Maple Hudson on tomorrow, July 16 at 9:45 am.  Pt aware.

## 2013-05-26 ENCOUNTER — Encounter: Payer: Self-pay | Admitting: Internal Medicine

## 2013-05-26 ENCOUNTER — Ambulatory Visit (INDEPENDENT_AMBULATORY_CARE_PROVIDER_SITE_OTHER): Payer: Medicare Other | Admitting: Internal Medicine

## 2013-05-26 VITALS — BP 160/90 | HR 74 | Ht 72.0 in | Wt 263.0 lb

## 2013-05-26 DIAGNOSIS — F17200 Nicotine dependence, unspecified, uncomplicated: Secondary | ICD-10-CM

## 2013-05-26 DIAGNOSIS — R599 Enlarged lymph nodes, unspecified: Secondary | ICD-10-CM

## 2013-05-26 DIAGNOSIS — C3490 Malignant neoplasm of unspecified part of unspecified bronchus or lung: Secondary | ICD-10-CM

## 2013-05-26 DIAGNOSIS — J441 Chronic obstructive pulmonary disease with (acute) exacerbation: Secondary | ICD-10-CM

## 2013-05-26 DIAGNOSIS — I5022 Chronic systolic (congestive) heart failure: Secondary | ICD-10-CM

## 2013-05-26 DIAGNOSIS — R590 Localized enlarged lymph nodes: Secondary | ICD-10-CM

## 2013-05-26 DIAGNOSIS — I509 Heart failure, unspecified: Secondary | ICD-10-CM

## 2013-05-26 DIAGNOSIS — J432 Centrilobular emphysema: Secondary | ICD-10-CM

## 2013-05-26 DIAGNOSIS — G4733 Obstructive sleep apnea (adult) (pediatric): Secondary | ICD-10-CM

## 2013-05-26 DIAGNOSIS — J438 Other emphysema: Secondary | ICD-10-CM

## 2013-05-26 NOTE — Patient Instructions (Addendum)
Order- schedule PET neck to thigh   Dx thoracic adenopathy  Order- DME Advanced- Home O2 2L for sleep    Dx COPD, CHF

## 2013-05-26 NOTE — Progress Notes (Signed)
03/31/13- [name] [name] smoker Concerned about dyspnea. Self referral-former [place] chest patient-currently uses CPAP; Pt's O2 level was 78% RA upon entering Exam room; placed on 2L/M O2 continous recked-sat of 94%   Wife here. Recent hospitalization for CHF after stopping diuretic. Was discharged on room air. Has become more short of breath, interfering with sleep. History of obstructive sleep apnea on CPAP 10/Advanced with nasal mask. Bedtime 11:50 PM, 30 minute sleep latency, waking 4 times before up at 6:30. Has lost 20 pounds in the last 2 years. Echocardiogram 03/03/2013 showed EF 40-45%. Still smoking 1 pack per day. Disability. Family history of heart disease. CT chest 4/ 22 /14 IMPRESSION:  1. Severely degraded evaluation for pulmonary embolism. No large  embolism identified.  2. Otherwise, motion degraded exam. Centrilobular emphysema  without definite pulmonary parenchymal process.  3. Development of thoracic adenopathy. Although this could be  reactive, lymphoproliferative process cannot be excluded. Consider  non emergent PET. If the patient has clinical congestive heart  failure, short-term follow-up after appropriate diuresis could  exclude adenopathy secondary to congestive failure.  4. Pulmonary artery enlargement suggests pulmonary arterial  hypertension.  5. Mild ascending aortic ectasia.  The patient could not be rescanned secondary to renal  insufficiency.  Original Report Authenticated By: Jeronimo Greaves, M.D.  04/14/13- 75 [name] smoker followed for COPD, abnl CT/adenopathy FOLLOW UP per CY-review CXR and CT scan done in April by another MD. Hospitalized April, 2014 for congestive heart failure. EF 40-45% on 03/03/2013. Continues CPAP/Advanced. He had been comfortable at 10; autotitration was too high. He has never had oxygen.  05/26/13- [name] [name] smoker followed for COPD, abnl CT/adenopathy, OSA/ CPAP FOLLOWS FOR: ov to discuss ONO results    (Wife just had open heart sgy) Using CPAP  10/ Advanced. ONOX on CPAP room air 05/04/13- significant desaturation time qualifying for oxygen. He says his dark fingernails have been that way all his life We looked back at his CT scan to discuss adenopathy and recommendation for PET scan CXR 04/14/13 IMPRESSION:  1. Low lung volumes.  2. No acute findings.  Original Report Authenticated By: Signa Kell, M.D.   ROS-see HPI Constitutional:   No-   weight loss, night sweats, fevers, chills, fatigue, lassitude. HEENT:   No-  headaches, difficulty swallowing, tooth/dental problems, sore throat,       No-  sneezing, itching, ear ache, +nasal congestion, post nasal drip,  CV:  No-   chest pain, orthopnea, PND, +swelling in lower extremities, anasarca, dizziness, palpitations Resp: +  shortness of breath with exertion or at rest.              No-   productive cough,  No non-productive cough,  No- coughing up of blood.              No-   change in color of mucus.  No- wheezing.   Skin: No-   rash or lesions. GI:  No-   heartburn, indigestion, abdominal pain, nausea, vomiting,  GU:  MS:  No-   joint pain or swelling.   Neuro-     nothing unusual Psych:  No- change in mood or affect. No depression, +anxiety.  No memory loss.  OBJ- Physical Exam General- Alert, Oriented, Affect-appropriate, Distress- none acute. + dark nailbeds may falsely           reduce oximetry scores. obese Skin- rash-none, lesions- none, excoriation- none Lymphadenopathy- none Head- atraumatic  Eyes- Gross vision intact, PERRLA, conjunctivae and secretions clear            Ears- Hearing, canals-normal            Nose- Clear, no-Septal dev, mucus, polyps, erosion, perforation             Throat- Mallampati II , mucosa clear , drainage- none, tonsils- atrophic, +upper dentures Neck- flexible , trachea midline, no stridor , thyroid nl, carotid no bruit Chest - symmetrical excursion , unlabored           Heart/CV- RRR , no murmur , no gallop  , no rub, nl s1  s2                           - JVD- none , edema+trace, stasis changes- none, varices- none           Lung- clear/ coarse breath sounds, wheeze- none, cough- none , dullness-none, rub- none           Chest wall-  Abd-  Br/ Gen/ Rectal- Not done, not indicated Extrem- cyanosis- none, clubbing, none, atrophy- none, strength- nl. + very darkly pigmented      fingernails seem to affect oximetry. Neuro- grossly intact to observation

## 2013-05-31 ENCOUNTER — Telehealth: Payer: Self-pay | Admitting: *Deleted

## 2013-05-31 ENCOUNTER — Other Ambulatory Visit: Payer: Self-pay | Admitting: Cardiology

## 2013-05-31 NOTE — Telephone Encounter (Signed)
Message copied by Damita Lack on Mon May 31, 2013 10:57 AM ------      Message from: Nestor Ramp      Created: Fri May 28, 2013  9:39 AM       Ricky Stabs we still have a patient emergency fund? This family is down ontheir luck, his wife getting CABG today and I just found out his co pays for all of his meds cost #70 bucks. He has not been able to afford his meds in over a month. Do we have any currency available? Let me know--I will be back after rounds (about lunch time) and maybe we can discuss      THANKS!      Denny Levy       ------

## 2013-05-31 NOTE — Telephone Encounter (Signed)
Mr. [name] [name] notified that we are able to help him get his medications thru our indigent fund.  I explained it was a one time use and he would need to have a letter signed by the pharmacist when he picks up his medications.  Mr. Schimming states understanding and is very appreciative.  Ileana Ladd

## 2013-06-01 ENCOUNTER — Other Ambulatory Visit: Payer: Self-pay | Admitting: Cardiology

## 2013-06-02 ENCOUNTER — Encounter: Payer: Self-pay | Admitting: Home Health Services

## 2013-06-07 ENCOUNTER — Encounter (HOSPITAL_COMMUNITY)
Admission: RE | Admit: 2013-06-07 | Discharge: 2013-06-07 | Disposition: A | Discharge status: Home / Self Care | Payer: Medicare Other | Source: Ambulatory Visit | Attending: Internal Medicine | Admitting: Internal Medicine

## 2013-06-07 DIAGNOSIS — R599 Enlarged lymph nodes, unspecified: Secondary | ICD-10-CM | POA: Insufficient documentation

## 2013-06-07 DIAGNOSIS — E042 Nontoxic multinodular goiter: Secondary | ICD-10-CM | POA: Insufficient documentation

## 2013-06-07 MED ORDER — FLUDEOXYGLUCOSE F - 18 (FDG) INJECTION
18.9000 | Freq: Once | INTRAVENOUS | Status: AC | PRN
  Administered 2013-06-07: 18.9 via INTRAVENOUS

## 2013-06-10 ENCOUNTER — Telehealth: Payer: Self-pay | Admitting: Family Medicine

## 2013-06-10 NOTE — Telephone Encounter (Signed)
Will fwd to MD.  Rosa Wyly L, CMA  

## 2013-06-10 NOTE — Telephone Encounter (Signed)
The patients wife is calling because he needs a refill on his Oxycodone.  Please call her when it is ready to be picked up.

## 2013-06-11 ENCOUNTER — Encounter: Payer: Self-pay | Admitting: Internal Medicine

## 2013-06-11 MED ORDER — OXYCODONE HCL 30 MG PO TABS: ORAL_TABLET | ORAL | Status: DC

## 2013-06-11 NOTE — Assessment & Plan Note (Signed)
Plan-PET scan. Hopefully this is reactive adenopathy

## 2013-06-11 NOTE — Assessment & Plan Note (Signed)
Plan-add oxygen 2 L to his CPAP for sleep/ Advanced

## 2013-06-11 NOTE — Telephone Encounter (Signed)
Dear Cliffton Asters Team I am placing this up front THANKS! Denny Levy

## 2013-06-11 NOTE — Telephone Encounter (Signed)
informed of rx up front for pick up

## 2013-06-11 NOTE — Assessment & Plan Note (Signed)
Good compliance and control. Desaturates with CPAP on room air reflecting sleep apnea and COPD. Plan-add oxygen 2 L during sleep

## 2013-07-14 ENCOUNTER — Encounter: Payer: Self-pay | Admitting: Family Medicine

## 2013-07-14 ENCOUNTER — Ambulatory Visit (INDEPENDENT_AMBULATORY_CARE_PROVIDER_SITE_OTHER): Payer: Medicare Other | Admitting: Family Medicine

## 2013-07-14 VITALS — Temp 98.6°F | Ht 72.0 in | Wt 255.1 lb

## 2013-07-14 DIAGNOSIS — E118 Type 2 diabetes mellitus with unspecified complications: Secondary | ICD-10-CM

## 2013-07-14 DIAGNOSIS — F329 Major depressive disorder, single episode, unspecified: Secondary | ICD-10-CM

## 2013-07-14 DIAGNOSIS — R590 Localized enlarged lymph nodes: Secondary | ICD-10-CM

## 2013-07-14 DIAGNOSIS — I10 Essential (primary) hypertension: Secondary | ICD-10-CM

## 2013-07-14 DIAGNOSIS — R300 Dysuria: Secondary | ICD-10-CM

## 2013-07-14 DIAGNOSIS — M5410 Radiculopathy, site unspecified: Secondary | ICD-10-CM

## 2013-07-14 DIAGNOSIS — R599 Enlarged lymph nodes, unspecified: Secondary | ICD-10-CM

## 2013-07-14 DIAGNOSIS — IMO0002 Reserved for concepts with insufficient information to code with codable children: Secondary | ICD-10-CM

## 2013-07-14 LAB — POCT URINALYSIS DIPSTICK
Bilirubin, UA: NEGATIVE
Glucose, UA: NEGATIVE
Nitrite, UA: NEGATIVE
Spec Grav, UA: 1.025

## 2013-07-14 LAB — GLUCOSE, CAPILLARY

## 2013-07-14 LAB — POCT UA - MICROSCOPIC ONLY

## 2013-07-14 MED ORDER — OXYCODONE HCL 30 MG PO TABS: ORAL_TABLET | ORAL | Status: DC

## 2013-07-14 MED ORDER — SIMVASTATIN 20 MG PO TABS: 20.0000 mg | ORAL_TABLET | Freq: Every day | ORAL | Status: DC

## 2013-07-15 ENCOUNTER — Ambulatory Visit (INDEPENDENT_AMBULATORY_CARE_PROVIDER_SITE_OTHER): Payer: Medicare Other | Admitting: Internal Medicine

## 2013-07-15 ENCOUNTER — Encounter: Payer: Self-pay | Admitting: Internal Medicine

## 2013-07-15 VITALS — BP 150/94 | HR 87 | Ht 72.0 in | Wt 254.4 lb

## 2013-07-15 DIAGNOSIS — G4733 Obstructive sleep apnea (adult) (pediatric): Secondary | ICD-10-CM

## 2013-07-15 DIAGNOSIS — R590 Localized enlarged lymph nodes: Secondary | ICD-10-CM

## 2013-07-15 DIAGNOSIS — R599 Enlarged lymph nodes, unspecified: Secondary | ICD-10-CM

## 2013-07-15 DIAGNOSIS — F17200 Nicotine dependence, unspecified, uncomplicated: Secondary | ICD-10-CM

## 2013-07-15 NOTE — Progress Notes (Signed)
03/31/13- 57 yoM smoker Concerned about dyspnea. Self referral-former [place] chest patient-currently uses CPAP; Pt's O2 level was 78% RA upon entering Exam room; placed on 2L/M O2 continous recked-sat of 94%   Wife here. Recent hospitalization for CHF after stopping diuretic. Was discharged on room air. Has become more short of breath, interfering with sleep. History of obstructive sleep apnea on CPAP 10/Advanced with nasal mask. Bedtime 11:50 PM, 30 minute sleep latency, waking 4 times before up at 6:30. Has lost 20 pounds in the last 2 years. Echocardiogram 03/03/2013 showed EF 40-45%. Still smoking 1 pack per day. Disability. Family history of heart disease. CT chest 4/ 22 /14 IMPRESSION:  1. Severely degraded evaluation for pulmonary embolism. No large  embolism identified.  2. Otherwise, motion degraded exam. Centrilobular emphysema  without definite pulmonary parenchymal process.  3. Development of thoracic adenopathy. Although this could be  reactive, lymphoproliferative process cannot be excluded. Consider  non emergent PET. If the patient has clinical congestive heart  failure, short-term follow-up after appropriate diuresis could  exclude adenopathy secondary to congestive failure.  4. Pulmonary artery enlargement suggests pulmonary arterial  hypertension.  5. Mild ascending aortic ectasia.  The patient could not be rescanned secondary to renal  insufficiency.  Original Report Authenticated By: Jeronimo Greaves, M.D.  04/14/13- 38 yoM smoker followed for COPD, abnl CT/adenopathy FOLLOW UP per CY-review CXR and CT scan done in April by another MD. Hospitalized April, 2014 for congestive heart failure. EF 40-45% on 03/03/2013. Continues CPAP/Advanced. He had been comfortable at 10; autotitration was too high. He has never had oxygen.  05/26/13- 57 yoM smoker followed for COPD, abnl CT/adenopathy, OSA/ CPAP FOLLOWS FOR: ov to discuss ONO results    (Wife just had open heart sgy) Using CPAP  10/ Advanced. ONOX on CPAP room air 05/04/13- significant desaturation time qualifying for oxygen. He says his dark fingernails have been that way all his life We looked back at his CT scan to discuss adenopathy and recommendation for PET scan CXR 04/14/13 IMPRESSION:  1. Low lung volumes.  2. No acute findings.  Original Report Authenticated By: Signa Kell, M.D.  07/15/13- 40 yoM smoker followed for COPD, abnl CT/adenopathy, OSA/ CPAP, complicated by DM, depression FOLLOWS FOR: wears CPAP every night with O2 about 8-9 hours; pressure working well for patient. Losing weight and not trying to(been under more stress): 270-> 254 since April 14, 2013. Admits stressed and depressed by wife's illness and associated debts for health care needs. He is comfortable with CPAP 10/Advanced plus oxygen 2 L. Adding the oxygen did help. Sleeps well. Denies cough or chest pain. PET scan in July showed improved adenopathy  ROS-see HPI Constitutional:   +  weight loss, night sweats, fevers, chills, fatigue, lassitude. HEENT:   No-  headaches, difficulty swallowing, tooth/dental problems, sore throat,       No-  sneezing, itching, ear ache, +nasal congestion, post nasal drip,  CV:  No-   chest pain, orthopnea, PND, +swelling in lower extremities, anasarca, dizziness, palpitations Resp: +  shortness of breath with exertion or at rest.              No-   productive cough,  No non-productive cough,  No- coughing up of blood.              No-   change in color of mucus.  No- wheezing.   Skin: No-   rash or lesions. GI:  No-   heartburn, indigestion, abdominal pain, nausea,  vomiting,  GU:  MS:  No-   joint pain or swelling.   Neuro-     nothing unusual Psych:  No- change in mood or affect. + depression, +anxiety.  No memory loss.  OBJ- Physical Exam General- Alert, Oriented, Affect-appropriate, Distress- none acute. + dark nailbeds may falsely           reduce oximetry scores. obese Skin- rash-none, lesions- none,  excoriation- none Lymphadenopathy- none Head- atraumatic            Eyes- Gross vision intact, PERRLA, conjunctivae and secretions clear            Ears- Hearing, canals-normal            Nose- Clear, no-Septal dev, mucus, polyps, erosion, perforation             Throat- Mallampati II , mucosa clear , drainage- none, tonsils- atrophic, +upper dentures Neck- flexible , trachea midline, no stridor , thyroid nl, carotid no bruit Chest - symmetrical excursion , unlabored           Heart/CV- RRR , no murmur , no gallop  , no rub, nl s1 s2                           - JVD- none , edema-none, stasis changes- none, varices- none           Lung- clear/ coarse breath sounds, wheeze- none, cough- none , dullness-none, rub- none           Chest wall-  Abd-  Br/ Gen/ Rectal- Not done, not indicated Extrem- cyanosis- none, clubbing, none, atrophy- none, strength- nl. + very darkly pigmented      fingernails seem to affect oximetry. Neuro- grossly intact to observation

## 2013-07-15 NOTE — Patient Instructions (Addendum)
Get flu vaccine this Fall  We can continue CPAP 10 with O2 2L for sleep/ Advanced  Please call as needed

## 2013-07-15 NOTE — Progress Notes (Signed)
  Subjective:    Patient ID: [name] [name], male    DOB: [birth year], 57 y.o.   MRN: [id number]  HPI  #1. Followup hypertension and cardiovascular disease. He's been out of his blood pressure medicines for 2 weeks secondary to financial constraints. #2. Mobile stressors. They're losing their house and he is essentially a bankruptcy. #3. Back pain is stable. He does be well with his pain medicines. Review of Systems Denies chest pain, fever, sweats, chills. No lower strandy edema. No headache. No visual changes.    Objective:   Physical Exam Vital signs reviewed. GENERAL: Well-developed, well-nourished, no acute distress. CARDIOVASCULAR: Regular rate and rhythm no murmur gallop or rub LUNGS: Clear to auscultation bilaterally, no rales or wheeze. ABDOMEN: Soft positive bowel sounds NEURO: No gross focal neurological deficits. MSK: Movement of extremity x 4.         Assessment & Plan:

## 2013-07-15 NOTE — Assessment & Plan Note (Signed)
Acid seems to be moving pretty well today I think if we could there is some stress in his life but it getting off his pain medicines in the pretty sure. I think the stress is contributing to muscle contraction in his overall pain.

## 2013-07-15 NOTE — Assessment & Plan Note (Signed)
Easily get his medicines filled today for his blood pressure. We talked about absolute importance of getting his blood pressure back down. He also needs refill on his simvastatin. He was placed on that by cardiologist so we'll not change it although her current recommendations I think he should be on Lipitor. This would probably be a little bit more expensive and ultimately he would probably end up not on anything so we'll leave it as is.

## 2013-07-15 NOTE — Assessment & Plan Note (Signed)
Increased stressors. Greater than 50% of her 40 mouses was spent in counseling and education regarding these issues.

## 2013-07-24 NOTE — Assessment & Plan Note (Signed)
Good control and good compliance. We discussed effective weight loss

## 2013-07-24 NOTE — Assessment & Plan Note (Signed)
Here maintenance off of cigarettes at this point and I gave him encouragement

## 2013-07-24 NOTE — Assessment & Plan Note (Signed)
PET scan indicates nodes are resolving, therefore likely inflammatory

## 2013-08-10 ENCOUNTER — Telehealth: Payer: Self-pay | Admitting: Family Medicine

## 2013-08-10 NOTE — Telephone Encounter (Signed)
Patent needs prescription for his oxycodone.  He has only enough left for tonight.  Their phone is out of order right now, so they will call back later today to check to see if it has been done.

## 2013-08-12 ENCOUNTER — Other Ambulatory Visit: Payer: Self-pay | Admitting: Family Medicine

## 2013-08-12 DIAGNOSIS — R590 Localized enlarged lymph nodes: Secondary | ICD-10-CM

## 2013-08-12 MED ORDER — OXYCODONE HCL 30 MG PO TABS: ORAL_TABLET | ORAL | Status: DC

## 2013-09-07 ENCOUNTER — Telehealth: Payer: Self-pay | Admitting: Family Medicine

## 2013-09-07 DIAGNOSIS — R590 Localized enlarged lymph nodes: Secondary | ICD-10-CM

## 2013-09-07 NOTE — Telephone Encounter (Signed)
Pt called and would like a refill of his oxycodone. JW

## 2013-09-08 MED ORDER — OXYCODONE HCL 30 MG PO TABS: ORAL_TABLET | ORAL | Status: DC

## 2013-09-08 NOTE — Telephone Encounter (Signed)
Dear Cliffton Asters Team PLEASE TELL HIM RX IS AT [facility]! Denny Levy

## 2013-09-09 NOTE — Telephone Encounter (Signed)
Attempted to call [facility] numbers the first number has been changed to an unknown number and the other is non working

## 2013-09-10 NOTE — Telephone Encounter (Signed)
Once again Attempted to call [facility] numbers the first number has been changed to an unknown number and the other is non working

## 2013-10-06 ENCOUNTER — Ambulatory Visit (INDEPENDENT_AMBULATORY_CARE_PROVIDER_SITE_OTHER): Payer: Medicare Other | Admitting: Family Medicine

## 2013-10-06 ENCOUNTER — Encounter: Payer: Self-pay | Admitting: Family Medicine

## 2013-10-06 VITALS — BP 159/99 | HR 74 | Temp 98.2°F | Ht 72.0 in | Wt 257.8 lb

## 2013-10-06 DIAGNOSIS — R590 Localized enlarged lymph nodes: Secondary | ICD-10-CM

## 2013-10-06 DIAGNOSIS — M5410 Radiculopathy, site unspecified: Secondary | ICD-10-CM

## 2013-10-06 DIAGNOSIS — IMO0002 Reserved for concepts with insufficient information to code with codable children: Secondary | ICD-10-CM

## 2013-10-06 DIAGNOSIS — R599 Enlarged lymph nodes, unspecified: Secondary | ICD-10-CM

## 2013-10-06 DIAGNOSIS — Z23 Encounter for immunization: Secondary | ICD-10-CM

## 2013-10-06 MED ORDER — OXYCODONE HCL 30 MG PO TABS: ORAL_TABLET | ORAL | Status: DC

## 2013-10-06 NOTE — Assessment & Plan Note (Signed)
Refilled pain meds (2 rx)  F/u 2 m Flu shot today

## 2013-10-06 NOTE — Progress Notes (Signed)
  Subjective:    Patient ID: [name] [name], male    DOB: March 09, 1956, 57 y.o.   MRN: [id number]  HPI Followup chronic pain. Doing a little better. Still requiring to 4 times a day pain medicine and is able to do a little bit more. His activity he feels like he starting to lose a little bit await. Also must get his flu shot today   Review of Systems Denies fever, sweats, chills, constipation.    Objective:   Physical Exam Vital signs are reviewed GENERAL: Well-developed male no acute distress BACK: Well-healed lumbar scar. Nontender to palpation. Limited flexion at hips. Her extremity strength 5 out of 5 hip flexors.       Assessment & Plan:

## 2013-11-26 ENCOUNTER — Encounter: Payer: Self-pay | Admitting: Cardiology

## 2013-11-26 ENCOUNTER — Ambulatory Visit (INDEPENDENT_AMBULATORY_CARE_PROVIDER_SITE_OTHER): Payer: Medicare Other | Admitting: Cardiology

## 2013-11-26 VITALS — BP 160/98 | HR 85 | Ht 72.0 in | Wt 261.0 lb

## 2013-11-26 DIAGNOSIS — I10 Essential (primary) hypertension: Secondary | ICD-10-CM

## 2013-11-26 DIAGNOSIS — I2510 Atherosclerotic heart disease of native coronary artery without angina pectoris: Secondary | ICD-10-CM

## 2013-11-26 DIAGNOSIS — F17200 Nicotine dependence, unspecified, uncomplicated: Secondary | ICD-10-CM

## 2013-11-26 DIAGNOSIS — E785 Hyperlipidemia, unspecified: Secondary | ICD-10-CM

## 2013-11-26 DIAGNOSIS — I428 Other cardiomyopathies: Secondary | ICD-10-CM | POA: Insufficient documentation

## 2013-11-26 MED ORDER — ISOSORBIDE MONONITRATE ER 60 MG PO TB24: 60.0000 mg | ORAL_TABLET | Freq: Every day | ORAL | Status: DC

## 2013-11-26 NOTE — Patient Instructions (Signed)
Your physician wants you to follow-up in: [place] will receive a reminder letter in the mail two months in advance. If you don't receive a letter, please call our office to schedule the follow-up appointment.   START ISOSORBIDE 60 MG ONCE DAILY  CALL IF BLOOD PRESSURE STILL RUNNING HIGH

## 2013-11-26 NOTE — Progress Notes (Signed)
HPI: FU hypertension. Cath in Feb 2002 revealed EF 50-55 and normal coronaries. CTA Jan 2006 showed no RAS. Last myoview 06/07/08 and revealed normal perfusion and EF 37 felt related to hypertensive heart disease. Last echocardiogram in April of 2014 showed an ejection fraction of 40-45% and mild right atrial enlargement. There was moderate left ventricular hypertrophy. Since he was last seen he denies dyspnea, chest pain, syncope. Occasional mild pedal edema.   Current Outpatient Prescriptions  Medication Sig Dispense Refill  . allopurinol (ZYLOPRIM) 100 MG tablet Take 1 tablet (100 mg total) by mouth daily.  90 tablet  3  . amLODipine (NORVASC) 10 MG tablet TAKE 1 TABLET (10 MG TOTAL) BY MOUTH EVERY MORNING.  30 tablet  11  . benazepril (LOTENSIN) 40 MG tablet TAKE 1 TABLET (40 MG TOTAL) BY MOUTH DAILY.  30 tablet  11  . buPROPion (WELLBUTRIN XL) 150 MG 24 hr tablet Take 1 tablet (150 mg total) by mouth daily.  30 tablet  3  . doxazosin (CARDURA) 8 MG tablet TAKE 1 TABLET (8 MG TOTAL) BY MOUTH DAILY.  30 tablet  11  . furosemide (LASIX) 80 MG tablet Take 0.5-1 tablets (40-80 mg total) by mouth daily as needed. for lower extremity edema  30 tablet  12  . hydrALAZINE (APRESOLINE) 100 MG tablet TAKE ONE TABLET BY MOUTH THREE TIMES DAILY  90 tablet  9  . metFORMIN (GLUCOPHAGE) 1000 MG tablet Take 1,000 mg by mouth 2 (two) times daily with a meal.      . metoprolol (LOPRESSOR) 100 MG tablet TAKE 1 TABLET (100 MG TOTAL) BY MOUTH 2 (TWO) TIMES DAILY.  180 tablet  2  . oxycodone (ROXICODONE) 30 MG immediate release tablet Take one tablet every 6 hours as needed for back pain. Do not fill before thirty days from the date of this prescription  120 tablet  0  . potassium chloride (K-DUR) 10 MEQ tablet TAKE 1 TABLET BY MOUTH DAILY  30 tablet  9  . simvastatin (ZOCOR) 20 MG tablet Take 1 tablet (20 mg total) by mouth daily.  90 tablet  3  . isosorbide mononitrate (IMDUR) 60 MG 24 hr tablet Take 1  tablet (60 mg total) by mouth daily.  90 tablet  3   No current facility-administered medications for this visit.   Facility-Administered Medications Ordered in Other Visits  Medication Dose Route Frequency Provider Last Rate Last Dose  . 0.9 %  sodium chloride infusion   Intravenous Continuous Hilton Sinclair, MD 20 mL/hr at 03/02/13 1440 500 mL at 03/02/13 1440     Past Medical History  Diagnosis Date  . Renal insufficiency, mild   . OA (osteoarthritis) of knee     right; mod-severe  . DM (diabetes mellitus), type 2     mild mod sensory loss plantar feet  . OA (osteoarthritis)   . History of colonoscopy   . Peripheral autonomic neuropathy due to secondary diabetes   . Hypothyroidism     since thyroidectomy  . Anxiety     takes buspar  . Sleep apnea     on cpap  . Complication of anesthesia     pt. reports he "flat lined " during surgery in  1999, told that  he had to be revived .  Pt. reports that it was surg. to replace his knee & [facility] [address].  [facility]- no record avail. on this pt.   Marland Kitchen  HTN (hypertension)     labile, followed by Dr. Stanford Breed  . Diastolic dysfunction   . Renal insufficiency   . CHF exacerbation 03/05/2013    Past Surgical History  Procedure Laterality Date  . Total knee arthroplasty  2001    left  . Thyroidectomy, partial      remote  . Cardiolite--no cad ef 34%  01/01/2005  . Joint replacement  2000    left knee   . Lumbar laminectomy/decompression microdiscectomy  09/25/2011    Procedure: LUMBAR LAMINECTOMY/DECOMPRESSION MICRODISCECTOMY;  Surgeon: Floyce Stakes;  Location: [place] [facility];  Service: Neurosurgery;  Laterality: Right;  Right Lumbar Five-Sacral One Microdiscectomy  . Back surgery      11/12  . Lumbar laminectomy/decompression microdiscectomy  01/03/2012    Procedure: LUMBAR LAMINECTOMY/DECOMPRESSION MICRODISCECTOMY;  Surgeon: Floyce Stakes, MD;  Location: [place] [facility];  Service:  Neurosurgery;  Laterality: Right;  Right L5-S1 Redo Diskectomy    History   Social History  . Marital Status: Married    Spouse Name: Towanna    Number of Children: 4  . Years of Education: some colle   Occupational History  . Retired-printing    Social History Main Topics  . Smoking status: Former Smoker -- 1.00 packs/day for 28 years    Types: Cigarettes    Quit date: 04/07/2013  . Smokeless tobacco: Never Used     Comment: wants to get back pain under control then wants to discuss  . Alcohol Use: No  . Drug Use: No  . Sexual Activity: Not on file   Other Topics Concern  . Not on file   Social History Narrative   Health Care POA:    Emergency Contact: wife, Budd Palmer [phone]   End of Life Plan:    Who lives with you: wife   Any pets: none   Diet: Pt has a varied diet of protein, starch, and vegetables.    Exercise: Pt reports exercising 1-3x a week at [facility].   Seatbelts: Pt reports wearing seatbelt when in vehicles.    Hobbies: fishing with cousin    ROS: chronic low back pain but no fevers or chills, productive cough, hemoptysis, dysphasia, odynophagia, melena, hematochezia, dysuria, hematuria, rash, seizure activity, orthopnea, PND, pedal edema, claudication. Remaining systems are negative.  Physical Exam: Well-developed well-nourished in no acute distress.  Skin is warm and dry.  HEENT is normal.  Neck is supple.  Chest is clear to auscultation with normal expansion.  Cardiovascular exam is regular rate and rhythm.  Abdominal exam nontender or distended. No masses palpated. Extremities show no edema. neuro grossly intact  ECG sinus rhythm at a rate of 85. First degree AV block. Left posterior fascicular block.

## 2013-11-26 NOTE — Assessment & Plan Note (Signed)
Blood pressure remains elevated. Add Imdur 60 mg daily. He will track his blood pressure at home and if his systolic is greater than 503 he will contact us and I will add clonidine.

## 2013-11-26 NOTE — Assessment & Plan Note (Signed)
Most likely related to severe hypertension. Continue ACE inhibitor and beta blocker. Continue hydralazine and add nitrates. We will continue to work on blood pressure control.

## 2013-11-26 NOTE — Assessment & Plan Note (Signed)
Continue statin. 

## 2013-11-26 NOTE — Assessment & Plan Note (Signed)
Patient counseled on discontinuing. 

## 2013-12-13 ENCOUNTER — Telehealth: Payer: Self-pay | Admitting: Family Medicine

## 2013-12-13 DIAGNOSIS — R590 Localized enlarged lymph nodes: Secondary | ICD-10-CM

## 2013-12-13 MED ORDER — OXYCODONE HCL 30 MG PO TABS: ORAL_TABLET | ORAL | Status: DC

## 2013-12-13 NOTE — Telephone Encounter (Signed)
Spoke with patient and informed him of below

## 2013-12-13 NOTE — Telephone Encounter (Signed)
Dear Dema Severin Team rx up front\ [facility]! Dorcas Mcmurray

## 2013-12-13 NOTE — Telephone Encounter (Signed)
Needs refill on oxycodone. °

## 2013-12-16 ENCOUNTER — Other Ambulatory Visit: Payer: Self-pay | Admitting: Family Medicine

## 2014-01-07 ENCOUNTER — Telehealth: Payer: Self-pay | Admitting: Family Medicine

## 2014-01-07 NOTE — Telephone Encounter (Signed)
Dear Dema Severin Team Tell then it will be there Monday Then please route back to me [facility]! Dorcas Mcmurray

## 2014-01-07 NOTE — Telephone Encounter (Signed)
Pt called and would like a refill on his oxycodone left up front. Please call when ready. jw

## 2014-01-07 NOTE — Telephone Encounter (Signed)
Relayed message,patient voiced understanding. [name] [name], [name] [name]

## 2014-01-10 NOTE — Telephone Encounter (Signed)
rx placed up front #[phone] no r [name] [name]

## 2014-01-13 ENCOUNTER — Ambulatory Visit (INDEPENDENT_AMBULATORY_CARE_PROVIDER_SITE_OTHER)
Admission: RE | Admit: 2014-01-13 | Discharge: 2014-01-13 | Disposition: A | Discharge status: Home / Self Care | Payer: Medicare Other | Source: Ambulatory Visit | Attending: Internal Medicine | Admitting: Internal Medicine

## 2014-01-13 ENCOUNTER — Ambulatory Visit: Payer: Medicare Other | Admitting: Internal Medicine

## 2014-01-13 ENCOUNTER — Encounter: Payer: Self-pay | Admitting: Internal Medicine

## 2014-01-13 ENCOUNTER — Ambulatory Visit (INDEPENDENT_AMBULATORY_CARE_PROVIDER_SITE_OTHER): Payer: Medicare Other | Admitting: Internal Medicine

## 2014-01-13 VITALS — BP 142/94 | HR 72 | Ht 72.0 in | Wt 270.0 lb

## 2014-01-13 DIAGNOSIS — G4733 Obstructive sleep apnea (adult) (pediatric): Secondary | ICD-10-CM

## 2014-01-13 DIAGNOSIS — F17200 Nicotine dependence, unspecified, uncomplicated: Secondary | ICD-10-CM

## 2014-01-13 DIAGNOSIS — R599 Enlarged lymph nodes, unspecified: Secondary | ICD-10-CM

## 2014-01-13 DIAGNOSIS — E669 Obesity, unspecified: Secondary | ICD-10-CM

## 2014-01-13 DIAGNOSIS — R590 Localized enlarged lymph nodes: Secondary | ICD-10-CM

## 2014-01-13 NOTE — Patient Instructions (Signed)
We can continue CPAP 10 with O2 2L for sleep/ Advanced  Order- CXR  Dx history of mediastinal adenopathy

## 2014-01-13 NOTE — Progress Notes (Signed)
03/31/13- 61 yoM smoker Concerned about dyspnea. Self referral-former [place] chest patient-currently uses CPAP; Pt's O2 level was 78% RA upon entering Exam room; placed on 2L/M O2 continous recked-sat of 94%   Wife here. Recent hospitalization for CHF after stopping diuretic. Was discharged on room air. Has become more short of breath, interfering with sleep. History of obstructive sleep apnea on CPAP 10/Advanced with nasal mask. Bedtime 11:50 PM, 30 minute sleep latency, waking 4 times before up at 6:30. Has lost 20 pounds in the last 2 years. Echocardiogram 03/03/2013 showed EF 40-45%. Still smoking 1 pack per day. Disability. Family history of heart disease. CT chest 4/ 22 /14 IMPRESSION:  1. Severely degraded evaluation for pulmonary embolism. No large  embolism identified.  2. Otherwise, motion degraded exam. Centrilobular emphysema  without definite pulmonary parenchymal process.  3. Development of thoracic adenopathy. Although this could be  reactive, lymphoproliferative process cannot be excluded. Consider  non emergent PET. If the patient has clinical congestive heart  failure, short-term follow-up after appropriate diuresis could  exclude adenopathy secondary to congestive failure.  4. Pulmonary artery enlargement suggests pulmonary arterial  hypertension.  5. Mild ascending aortic ectasia.  The patient could not be rescanned secondary to renal  insufficiency.  Original Report Authenticated By: Abigail Miyamoto, M.D.  04/14/13- 81 yoM smoker followed for COPD, abnl CT/adenopathy FOLLOW UP per CY-review CXR and CT scan done in April by another MD. Hospitalized April, 2014 for congestive heart failure. EF 40-45% on 03/03/2013. Continues CPAP/Advanced. He had been comfortable at 10; autotitration was too high. He has never had oxygen.  05/26/13- 6 yoM smoker followed for COPD, abnl CT/adenopathy, OSA/ CPAP FOLLOWS FOR: ov to discuss ONO results    (Wife just had open heart sgy) Using CPAP  10/ Advanced. ONOX on CPAP room air 05/04/13- significant desaturation time qualifying for oxygen. He says his dark fingernails have been that way all his life We looked back at his CT scan to discuss adenopathy and recommendation for PET scan CXR 04/14/13 IMPRESSION:  1. Low lung volumes.  2. No acute findings.  Original Report Authenticated By: Kerby Moors, M.D.  07/15/13- 76 yoM smoker followed for COPD, abnl CT/adenopathy, OSA/ CPAP, complicated by DM, depression FOLLOWS FOR: wears CPAP every night with O2 about 8-9 hours; pressure working well for patient. Losing weight and not trying to(been under more stress): 270-> 254 since April 14, 2013. Admits stressed and depressed by wife's illness and associated debts for health care needs. He is comfortable with CPAP 10/Advanced plus oxygen 2 L. Adding the oxygen did help. Sleeps well. Denies cough or chest pain. PET scan in July showed improved adenopathy  01/12/14- 58 yoM smoker followed for COPD, abnl CT/adenopathy, OSA/ CPAP, complicated by DM, depression FOLLOWS FOR:  Wears CPAP 10/ Advanced with O2 2L every night for about 8 hours; DME is [facility]. Weight has gone back up. CPAP compliance and control seem good.   ROS-see HPI Constitutional:   No- weight loss, night sweats, fevers, chills, fatigue, lassitude. HEENT:   No-  headaches, difficulty swallowing, tooth/dental problems, sore throat,       No-  sneezing, itching, ear ache, +nasal congestion, post nasal drip,  CV:  No-   chest pain, orthopnea, PND, +swelling in lower extremities, anasarca, dizziness, palpitations Resp: +  shortness of breath with exertion or at rest.              No-   productive cough,  No non-productive cough,  No- coughing  up of blood.              No-   change in color of mucus.  No- wheezing.   Skin: No-   rash or lesions. GI:  No-   heartburn, indigestion, abdominal pain, nausea, vomiting,  GU:  MS:  No-   joint pain or swelling.   Neuro-     nothing  unusual Psych:  No- change in mood or affect. + depression, +anxiety.  No memory loss.  OBJ- Physical Exam General- Alert, Oriented, Affect-appropriate, Distress- none acute. + dark nailbeds may falsely reduce oximetry scores. obese Skin- rash-none, lesions- none, excoriation- none Lymphadenopathy- none Head- atraumatic            Eyes- Gross vision intact, PERRLA, conjunctivae and secretions clear            Ears- Hearing, canals-normal            Nose- Clear, no-Septal dev, mucus, polyps, erosion, perforation             Throat- Mallampati II , mucosa clear , drainage- none, tonsils- atrophic, +upper dentures Neck- flexible , trachea midline, no stridor , thyroid nl, carotid no bruit Chest - symmetrical excursion , unlabored           Heart/CV- RRR , no murmur , no gallop  , no rub, nl s1 s2                           - JVD- none , edema-none, stasis changes- none, varices- none           Lung- clear, wheeze- none, cough- none , dullness-none, rub- none           Chest wall-  Abd-  Br/ Gen/ Rectal- Not done, not indicated Extrem- cyanosis- none, clubbing, none, atrophy- none, strength- nl. + very darkly pigmented fingernails seem to affect oximetry. Neuro- grossly intact to observation

## 2014-01-18 ENCOUNTER — Ambulatory Visit: Payer: Medicare Other | Admitting: Internal Medicine

## 2014-01-20 ENCOUNTER — Other Ambulatory Visit: Payer: Self-pay | Admitting: Internal Medicine

## 2014-01-20 DIAGNOSIS — J432 Centrilobular emphysema: Secondary | ICD-10-CM

## 2014-01-27 ENCOUNTER — Other Ambulatory Visit: Payer: Medicare Other

## 2014-01-31 ENCOUNTER — Ambulatory Visit (INDEPENDENT_AMBULATORY_CARE_PROVIDER_SITE_OTHER)
Admission: RE | Admit: 2014-01-31 | Discharge: 2014-01-31 | Disposition: A | Discharge status: Home / Self Care | Payer: Medicare Other | Source: Ambulatory Visit | Attending: Internal Medicine | Admitting: Internal Medicine

## 2014-01-31 DIAGNOSIS — J438 Other emphysema: Secondary | ICD-10-CM

## 2014-01-31 DIAGNOSIS — J432 Centrilobular emphysema: Secondary | ICD-10-CM

## 2014-02-01 ENCOUNTER — Other Ambulatory Visit: Payer: Self-pay | Admitting: Family Medicine

## 2014-02-01 ENCOUNTER — Encounter: Payer: Self-pay | Admitting: Internal Medicine

## 2014-02-01 MED ORDER — LIDOCAINE-MENTHOL 4-1 % EX PTCH: MEDICATED_PATCH | CUTANEOUS | Status: DC

## 2014-02-01 NOTE — Assessment & Plan Note (Signed)
Plan- CXR 

## 2014-02-01 NOTE — Assessment & Plan Note (Signed)
Remains off

## 2014-02-01 NOTE — Assessment & Plan Note (Signed)
Plan- counseled weight loss.

## 2014-02-01 NOTE — Progress Notes (Signed)
[facility] request for lidoderm patch--opatient wants to try to help w LBP. Have filled out and faxed [facility] [phone]

## 2014-02-01 NOTE — Assessment & Plan Note (Signed)
Good compliance and control 

## 2014-02-08 ENCOUNTER — Telehealth: Payer: Self-pay | Admitting: Family Medicine

## 2014-02-08 DIAGNOSIS — R590 Localized enlarged lymph nodes: Secondary | ICD-10-CM

## 2014-02-08 NOTE — Telephone Encounter (Signed)
Needs refill on oxycodone Please call when ready for pickup

## 2014-02-09 MED ORDER — OXYCODONE HCL 30 MG PO TABS: ORAL_TABLET | ORAL | Status: DC

## 2014-02-09 NOTE — Telephone Encounter (Signed)
Spoke with patient's wife and informed her of below

## 2014-02-09 NOTE — Telephone Encounter (Signed)
Dear [name] [name] Team Please tell him it is up front [facility]! Dorcas Mcmurray

## 2014-02-10 NOTE — Progress Notes (Signed)
Quick Note:  Called and spoke with pt regarding results. Pt verbalized understanding and denied any further questions or concerns at this time. ______

## 2014-02-22 NOTE — Telephone Encounter (Signed)
error 

## 2014-03-09 ENCOUNTER — Encounter: Payer: Self-pay | Admitting: Family Medicine

## 2014-03-09 ENCOUNTER — Ambulatory Visit (INDEPENDENT_AMBULATORY_CARE_PROVIDER_SITE_OTHER): Payer: Medicare Other | Admitting: Family Medicine

## 2014-03-09 VITALS — BP 173/102 | HR 84 | Temp 98.1°F | Ht 72.0 in | Wt 260.0 lb

## 2014-03-09 DIAGNOSIS — F17200 Nicotine dependence, unspecified, uncomplicated: Secondary | ICD-10-CM

## 2014-03-09 DIAGNOSIS — R599 Enlarged lymph nodes, unspecified: Secondary | ICD-10-CM

## 2014-03-09 DIAGNOSIS — I10 Essential (primary) hypertension: Secondary | ICD-10-CM

## 2014-03-09 DIAGNOSIS — E118 Type 2 diabetes mellitus with unspecified complications: Secondary | ICD-10-CM

## 2014-03-09 DIAGNOSIS — R590 Localized enlarged lymph nodes: Secondary | ICD-10-CM

## 2014-03-09 DIAGNOSIS — E039 Hypothyroidism, unspecified: Secondary | ICD-10-CM

## 2014-03-09 DIAGNOSIS — R809 Proteinuria, unspecified: Secondary | ICD-10-CM

## 2014-03-09 LAB — POCT URINALYSIS DIPSTICK
Bilirubin, UA: NEGATIVE
Blood, UA: NEGATIVE
GLUCOSE UA: NEGATIVE
Ketones, UA: NEGATIVE
NITRITE UA: NEGATIVE
PROTEIN UA: 100
Spec Grav, UA: 1.025
UROBILINOGEN UA: 0.2
pH, UA: 6

## 2014-03-09 LAB — POCT UA - MICROSCOPIC ONLY: WBC, Ur, HPF, POC: >20

## 2014-03-09 LAB — COMPREHENSIVE METABOLIC PANEL
ALK PHOS: 78 U/L (ref 39–117)
AST: 11 U/L (ref 0–37)
Albumin: 3.7 g/dL (ref 3.5–5.2)
BILIRUBIN TOTAL: 0.6 mg/dL (ref 0.2–1.2)
BUN: 14 mg/dL (ref 6–23)
CO2: 25 mEq/L (ref 19–32)
CREATININE: 1.41 mg/dL — AB (ref 0.50–1.35)
Calcium: 8.6 mg/dL (ref 8.4–10.5)
Chloride: 110 mEq/L (ref 96–112)
Glucose, Bld: 114 mg/dL — ABNORMAL HIGH (ref 70–99)
Potassium: 4.4 mEq/L (ref 3.5–5.3)
SODIUM: 143 meq/L (ref 135–145)
TOTAL PROTEIN: 6.5 g/dL (ref 6.0–8.3)

## 2014-03-09 LAB — POCT GLYCOSYLATED HEMOGLOBIN (HGB A1C): HEMOGLOBIN A1C: 6.3

## 2014-03-09 LAB — LDL CHOLESTEROL, DIRECT: LDL DIRECT: 75 mg/dL

## 2014-03-09 MED ORDER — OXYCODONE HCL 30 MG PO TABS: ORAL_TABLET | ORAL | Status: DC

## 2014-03-09 NOTE — Patient Instructions (Addendum)
Take your lasix DAILY starting today See me next Wednesday Say HI to [facility]

## 2014-03-10 LAB — TSH: TSH: 3.306 u[IU]/mL (ref 0.350–4.500)

## 2014-03-11 NOTE — Progress Notes (Signed)
   Subjective:    Patient ID: [name] [name], male    DOB: Jun 08, 1956, 58 y.o.   MRN: [id number]  HPI Increased lower strandy swelling. He admits to not using his diuretic except for 2 days a week. He says if he uses it daily then he has to spend all day in the bathroom. This is complicating things as his wife's been extremely ill and has been driving her back and forth to doctor's appointments etc. Some increased weight but is not been weighing himself regular. No chest pain.   Review of Systems No palpitations. See history of present illness above.    Objective:   Physical Exam  Vital signs are reviewed GENERAL: Well-developed overweight male no acute distress in EXTREMITY: 1+ pitting edema bilaterally lower extremity to the ankle. LUNGS: Clear to auscultation bilaterally ABDOMEN: Soft obese positive bowel sounds CV: Regular rate and rhythm without murmur      Assessment & Plan:  Lower extremity swelling secondary to noncompliance with his diuretic. We spent greater than 50% of our 35 minute office visit counseling him regarding this. He will absolutely has to take his diuretic on a regular basis. We have some problem-solving for this. I'll see him back in one week. We'll check some labs today.

## 2014-03-13 ENCOUNTER — Other Ambulatory Visit: Payer: Self-pay | Admitting: Family Medicine

## 2014-03-13 ENCOUNTER — Other Ambulatory Visit: Payer: Self-pay | Admitting: Cardiology

## 2014-03-14 ENCOUNTER — Other Ambulatory Visit: Payer: Self-pay | Admitting: Family Medicine

## 2014-03-15 ENCOUNTER — Other Ambulatory Visit: Payer: Self-pay | Admitting: Family Medicine

## 2014-03-17 ENCOUNTER — Encounter: Payer: Self-pay | Admitting: Family Medicine

## 2014-03-23 ENCOUNTER — Encounter: Payer: Self-pay | Admitting: Family Medicine

## 2014-03-23 ENCOUNTER — Ambulatory Visit (INDEPENDENT_AMBULATORY_CARE_PROVIDER_SITE_OTHER): Payer: Medicare Other | Admitting: Family Medicine

## 2014-03-23 VITALS — BP 123/69 | HR 81 | Temp 98.0°F | Ht 72.0 in | Wt 261.7 lb

## 2014-03-23 DIAGNOSIS — R590 Localized enlarged lymph nodes: Secondary | ICD-10-CM

## 2014-03-23 DIAGNOSIS — R599 Enlarged lymph nodes, unspecified: Secondary | ICD-10-CM

## 2014-03-23 DIAGNOSIS — N289 Disorder of kidney and ureter, unspecified: Secondary | ICD-10-CM

## 2014-03-23 DIAGNOSIS — I10 Essential (primary) hypertension: Secondary | ICD-10-CM

## 2014-03-23 LAB — BASIC METABOLIC PANEL
BUN: 17 mg/dL (ref 6–23)
CHLORIDE: 109 meq/L (ref 96–112)
CO2: 26 mEq/L (ref 19–32)
CREATININE: 1.58 mg/dL — AB (ref 0.50–1.35)
Calcium: 8.7 mg/dL (ref 8.4–10.5)
Glucose, Bld: 135 mg/dL — ABNORMAL HIGH (ref 70–99)
Potassium: 4.1 mEq/L (ref 3.5–5.3)
Sodium: 142 mEq/L (ref 135–145)

## 2014-03-23 MED ORDER — FUROSEMIDE 80 MG PO TABS: ORAL_TABLET | ORAL | Status: DC

## 2014-03-23 MED ORDER — METFORMIN HCL 1000 MG PO TABS: ORAL_TABLET | ORAL | Status: DC

## 2014-03-23 MED ORDER — OXYCODONE HCL 30 MG PO TABS: ORAL_TABLET | ORAL | Status: DC

## 2014-03-28 ENCOUNTER — Telehealth: Payer: Self-pay | Admitting: Clinical

## 2014-03-28 NOTE — Assessment & Plan Note (Addendum)
We'll check his BMP today to look for creatinine level. He is doing well with the diuresis but still little puffy. I'll increase him to 80 mg one day alternating with 120 mg daily.

## 2014-03-28 NOTE — Progress Notes (Signed)
   Subjective:    Patient ID: [name] [name], male    DOB: 1956/05/08, 58 y.o.   MRN: [id number]  HPI  Followup change in diuretic. He's been taking it regularly. Has lost 9 pounds. Having improvement in his shortness of breath in his lower strandy swelling. #2. Needs refills on his medicines. #3. Romancing that we will check his kidney function since he started the increased diuretic regimen.  Review of Systems Weight loss that is secondary to diuresis. No fever, sweats, chills.    Objective:   Physical Exam Vital signs reviewed. GENERAL: Well-developed, well-nourished, no acute distress. CARDIOVASCULAR: Regular rate and rhythm no murmur gallop or rub LUNGS: Clear to auscultation bilaterally, no rales or wheeze. ABDOMEN: Soft positive bowel sounds NEURO: No gross focal neurological deficits. MSK: Movement of extremity x 4.         Assessment & Plan:

## 2014-03-28 NOTE — Telephone Encounter (Signed)
CSW spoke with pt regarding his mask for his Cpap. Pt stated he was informed that he could not get a new one until June. CSW contacted [facility] [phone] and was told that pt could actually qualify for one now- he unfortuantely was given the wrong information. Representative to contact pt regarding ordering a mask.  CSW left a message for pt to confirm he received a call from [place].  Hunt Oris, MSW, [place]

## 2014-04-07 ENCOUNTER — Encounter: Payer: Self-pay | Admitting: Family Medicine

## 2014-04-14 ENCOUNTER — Other Ambulatory Visit: Payer: Self-pay | Admitting: Cardiology

## 2014-04-27 ENCOUNTER — Ambulatory Visit: Payer: Medicare Other | Admitting: Family Medicine

## 2014-05-04 ENCOUNTER — Encounter: Payer: Self-pay | Admitting: Family Medicine

## 2014-05-04 ENCOUNTER — Ambulatory Visit (INDEPENDENT_AMBULATORY_CARE_PROVIDER_SITE_OTHER): Payer: Medicare Other | Admitting: Family Medicine

## 2014-05-04 VITALS — BP 114/67 | HR 85 | Temp 99.0°F | Ht 72.0 in | Wt 259.2 lb

## 2014-05-04 DIAGNOSIS — R590 Localized enlarged lymph nodes: Secondary | ICD-10-CM

## 2014-05-04 DIAGNOSIS — R109 Unspecified abdominal pain: Secondary | ICD-10-CM

## 2014-05-04 DIAGNOSIS — E118 Type 2 diabetes mellitus with unspecified complications: Secondary | ICD-10-CM

## 2014-05-04 DIAGNOSIS — N289 Disorder of kidney and ureter, unspecified: Secondary | ICD-10-CM

## 2014-05-04 DIAGNOSIS — R599 Enlarged lymph nodes, unspecified: Secondary | ICD-10-CM

## 2014-05-04 LAB — BASIC METABOLIC PANEL
BUN: 20 mg/dL (ref 6–23)
CO2: 22 mEq/L (ref 19–32)
CREATININE: 1.79 mg/dL — AB (ref 0.50–1.35)
Calcium: 8.8 mg/dL (ref 8.4–10.5)
Chloride: 106 mEq/L (ref 96–112)
GLUCOSE: 108 mg/dL — AB (ref 70–99)
Potassium: 4.1 mEq/L (ref 3.5–5.3)
Sodium: 143 mEq/L (ref 135–145)

## 2014-05-04 MED ORDER — OXYCODONE HCL 30 MG PO TABS: ORAL_TABLET | ORAL | Status: DC

## 2014-05-06 ENCOUNTER — Encounter: Payer: Self-pay | Admitting: Family Medicine

## 2014-05-06 ENCOUNTER — Other Ambulatory Visit: Payer: Self-pay | Admitting: Family Medicine

## 2014-05-06 DIAGNOSIS — N289 Disorder of kidney and ureter, unspecified: Secondary | ICD-10-CM

## 2014-05-06 NOTE — Progress Notes (Signed)
   Subjective:    Patient ID: [name] [name], male    DOB: 06-05-1956, 58 y.o.   MRN: [id number]  HPI  Episodic abdominal and back pain. Happens when he's been up and moving around quite a bit. Almost feels like gas or heartburn but seems to also affect his lower back. Notices it most when he getting in and out of a car, especially if it's really hot outside. He has to sit there for a few minutes and do deep breaths and then it passes. He's not had any of these symptoms at night when he goes to bed. They do not seem to be associated with eating or with activity that he's aware of (other than occurring when he is getting in and out of a car).  He's been less active because he had to drop his [employer] membership secondary to financial reasons. #3. Needs refill on his pain medicine. He still taking them every 6 hours pretty much around the clock. Says if he does not take them one time he has significant increase in his right radicular back pain that radiates all the way down his right leg to the foot. He denies bowel or bladder incontinence.    Review of Systems No fever, sweats, chills. He is also a few pounds but is very pleased with that. No chest pain. No change in his exercise tolerance. Occasionally has some right lower extremity pain secondary to his radicular issues    Objective:   Physical Exam  Vital signs reviewed. GENERAL: Well-developed, well-nourished, no acute distress. CARDIOVASCULAR: Regular rate and rhythm no murmur gallop or rub LUNGS: Clear to auscultation bilaterally, no rales or wheeze. ABDOMEN: Soft positive bowel sounds no rebound, no guarding. No masses although this exam is somewhat limited by habitus. I hear no bruits. MSK: Movement of extremity x 4. No lower extremity edema. Calves bilaterally are soft as is the popliteal space.        Assessment & Plan:  #1. Unusual abdominal/back pain. I suspect this is musculoskeletal but I think overall doubt anything serious  by getting abdominal ultrasound to look at his aorta. #2. Chronic kidney disease. All in this closely and we'll recheck his creatinine today. #3. Chronic right radicular back pain radiating to his right lower extremity. We'll continue him on his current medication regimen. I'd also like to see him being to get back into the [facility]. We'll have our Education officer, museum investigate.

## 2014-05-22 ENCOUNTER — Emergency Department (HOSPITAL_COMMUNITY): Payer: Medicare Other

## 2014-05-22 ENCOUNTER — Inpatient Hospital Stay (HOSPITAL_COMMUNITY)
Admission: EM | Admit: 2014-05-22 | Discharge: 2014-05-24 | DRG: 291 | Disposition: A | Discharge status: Home / Self Care | Payer: Medicare Other | Attending: Family Medicine | Admitting: Family Medicine

## 2014-05-22 ENCOUNTER — Encounter (HOSPITAL_COMMUNITY): Payer: Self-pay | Admitting: Emergency Medicine

## 2014-05-22 DIAGNOSIS — N183 Chronic kidney disease, stage 3 unspecified: Secondary | ICD-10-CM | POA: Diagnosis present

## 2014-05-22 DIAGNOSIS — G4733 Obstructive sleep apnea (adult) (pediatric): Secondary | ICD-10-CM | POA: Diagnosis present

## 2014-05-22 DIAGNOSIS — I129 Hypertensive chronic kidney disease with stage 1 through stage 4 chronic kidney disease, or unspecified chronic kidney disease: Secondary | ICD-10-CM | POA: Diagnosis present

## 2014-05-22 DIAGNOSIS — I509 Heart failure, unspecified: Secondary | ICD-10-CM | POA: Diagnosis present

## 2014-05-22 DIAGNOSIS — J432 Centrilobular emphysema: Secondary | ICD-10-CM | POA: Diagnosis present

## 2014-05-22 DIAGNOSIS — J9620 Acute and chronic respiratory failure, unspecified whether with hypoxia or hypercapnia: Secondary | ICD-10-CM | POA: Diagnosis present

## 2014-05-22 DIAGNOSIS — D696 Thrombocytopenia, unspecified: Secondary | ICD-10-CM | POA: Diagnosis present

## 2014-05-22 DIAGNOSIS — Z96659 Presence of unspecified artificial knee joint: Secondary | ICD-10-CM

## 2014-05-22 DIAGNOSIS — I10 Essential (primary) hypertension: Secondary | ICD-10-CM

## 2014-05-22 DIAGNOSIS — I428 Other cardiomyopathies: Secondary | ICD-10-CM | POA: Diagnosis present

## 2014-05-22 DIAGNOSIS — E118 Type 2 diabetes mellitus with unspecified complications: Secondary | ICD-10-CM | POA: Diagnosis present

## 2014-05-22 DIAGNOSIS — F17200 Nicotine dependence, unspecified, uncomplicated: Secondary | ICD-10-CM

## 2014-05-22 DIAGNOSIS — M109 Gout, unspecified: Secondary | ICD-10-CM | POA: Diagnosis present

## 2014-05-22 DIAGNOSIS — E119 Type 2 diabetes mellitus without complications: Secondary | ICD-10-CM | POA: Diagnosis present

## 2014-05-22 DIAGNOSIS — M79604 Pain in right leg: Secondary | ICD-10-CM

## 2014-05-22 DIAGNOSIS — Z87891 Personal history of nicotine dependence: Secondary | ICD-10-CM

## 2014-05-22 DIAGNOSIS — M79609 Pain in unspecified limb: Secondary | ICD-10-CM

## 2014-05-22 DIAGNOSIS — I5023 Acute on chronic systolic (congestive) heart failure: Principal | ICD-10-CM | POA: Diagnosis present

## 2014-05-22 DIAGNOSIS — I5021 Acute systolic (congestive) heart failure: Secondary | ICD-10-CM

## 2014-05-22 DIAGNOSIS — N179 Acute kidney failure, unspecified: Secondary | ICD-10-CM | POA: Diagnosis present

## 2014-05-22 DIAGNOSIS — M10071 Idiopathic gout, right ankle and foot: Secondary | ICD-10-CM

## 2014-05-22 DIAGNOSIS — N289 Disorder of kidney and ureter, unspecified: Secondary | ICD-10-CM

## 2014-05-22 DIAGNOSIS — I5022 Chronic systolic (congestive) heart failure: Secondary | ICD-10-CM | POA: Diagnosis present

## 2014-05-22 DIAGNOSIS — J441 Chronic obstructive pulmonary disease with (acute) exacerbation: Secondary | ICD-10-CM | POA: Diagnosis present

## 2014-05-22 DIAGNOSIS — R0902 Hypoxemia: Secondary | ICD-10-CM

## 2014-05-22 LAB — BLOOD GAS, ARTERIAL
Acid-Base Excess: 0.2 mmol/L (ref 0.0–2.0)
Bicarbonate: 23.9 mEq/L (ref 20.0–24.0)
Drawn by: 33100
O2 CONTENT: 3 L/min
O2 SAT: 78.2 %
PATIENT TEMPERATURE: 97.9
PH ART: 7.444 (ref 7.350–7.450)
PO2 ART: 40.9 mmHg — AB (ref 80.0–100.0)
TCO2: 25 mmol/L (ref 0–100)
pCO2 arterial: 35.2 mmHg (ref 35.0–45.0)

## 2014-05-22 LAB — MRSA PCR SCREENING: MRSA BY PCR: NEGATIVE

## 2014-05-22 LAB — URINALYSIS, ROUTINE W REFLEX MICROSCOPIC
Bilirubin Urine: NEGATIVE
GLUCOSE, UA: NEGATIVE mg/dL
Hgb urine dipstick: NEGATIVE
KETONES UR: NEGATIVE mg/dL
Leukocytes, UA: NEGATIVE
Nitrite: NEGATIVE
PH: 5 (ref 5.0–8.0)
Protein, ur: NEGATIVE mg/dL
Specific Gravity, Urine: 1.01 (ref 1.005–1.030)
Urobilinogen, UA: 0.2 mg/dL (ref 0.0–1.0)

## 2014-05-22 LAB — BASIC METABOLIC PANEL
ANION GAP: 14 (ref 5–15)
BUN: 16 mg/dL (ref 6–23)
CALCIUM: 9.4 mg/dL (ref 8.4–10.5)
CO2: 26 meq/L (ref 19–32)
Chloride: 102 mEq/L (ref 96–112)
Creatinine, Ser: 1.63 mg/dL — ABNORMAL HIGH (ref 0.50–1.35)
GFR calc non Af Amer: 45 mL/min — ABNORMAL LOW (ref >90)
GFR, EST AFRICAN AMERICAN: 52 mL/min — AB (ref >90)
Glucose, Bld: 160 mg/dL — ABNORMAL HIGH (ref 70–99)
Potassium: 4.3 mEq/L (ref 3.7–5.3)
SODIUM: 142 meq/L (ref 137–147)

## 2014-05-22 LAB — TROPONIN I: Troponin I: <0.3 ng/mL (ref <0.30)

## 2014-05-22 LAB — CBC
HCT: 52.3 % — ABNORMAL HIGH (ref 39.0–52.0)
HEMATOCRIT: 52.9 % — AB (ref 39.0–52.0)
Hemoglobin: 17.5 g/dL — ABNORMAL HIGH (ref 13.0–17.0)
Hemoglobin: 17.4 g/dL — ABNORMAL HIGH (ref 13.0–17.0)
MCH: 26.7 pg (ref 26.0–34.0)
MCH: 27.2 pg (ref 26.0–34.0)
MCHC: 33.5 g/dL (ref 30.0–36.0)
MCHC: 32.9 g/dL (ref 30.0–36.0)
MCV: 79.8 fL (ref 78.0–100.0)
MCV: 82.8 fL (ref 78.0–100.0)
PLATELETS: 119 10*3/uL — AB (ref 150–400)
Platelets: 92 10*3/uL — ABNORMAL LOW (ref 150–400)
RBC: 6.55 MIL/uL — AB (ref 4.22–5.81)
RBC: 6.39 MIL/uL — ABNORMAL HIGH (ref 4.22–5.81)
RDW: 15.4 % (ref 11.5–15.5)
RDW: 15.7 % — AB (ref 11.5–15.5)
WBC: 7.2 10*3/uL (ref 4.0–10.5)
WBC: 7.4 10*3/uL (ref 4.0–10.5)

## 2014-05-22 LAB — PRO B NATRIURETIC PEPTIDE: Pro B Natriuretic peptide (BNP): 1124 pg/mL — ABNORMAL HIGH (ref 0–125)

## 2014-05-22 LAB — CREATININE, SERUM
CREATININE: 1.59 mg/dL — AB (ref 0.50–1.35)
GFR calc Af Amer: 54 mL/min — ABNORMAL LOW (ref >90)
GFR, EST NON AFRICAN AMERICAN: 47 mL/min — AB (ref >90)

## 2014-05-22 LAB — GLUCOSE, CAPILLARY
GLUCOSE-CAPILLARY: 240 mg/dL — AB (ref 70–99)
Glucose-Capillary: 241 mg/dL — ABNORMAL HIGH (ref 70–99)

## 2014-05-22 LAB — URIC ACID: URIC ACID, SERUM: 9.4 mg/dL — AB (ref 4.0–7.8)

## 2014-05-22 MED ORDER — OXYCODONE-ACETAMINOPHEN 5-325 MG PO TABS
1.0000 | ORAL_TABLET | Freq: Once | ORAL | Status: AC
  Administered 2014-05-22: 1 via ORAL
  Filled 2014-05-22: qty 1

## 2014-05-22 MED ORDER — AMLODIPINE BESYLATE 10 MG PO TABS
10.0000 mg | ORAL_TABLET | Freq: Every day | ORAL | Status: DC
  Administered 2014-05-22 – 2014-05-24 (×3): 10 mg via ORAL
  Filled 2014-05-22 (×3): qty 1

## 2014-05-22 MED ORDER — HYDROMORPHONE HCL PF 1 MG/ML IJ SOLN
1.0000 mg | Freq: Once | INTRAMUSCULAR | Status: AC
  Administered 2014-05-22: 1 mg via INTRAVENOUS
  Filled 2014-05-22: qty 1

## 2014-05-22 MED ORDER — SODIUM CHLORIDE 0.9 % IV SOLN: 250.0000 mL | INTRAVENOUS | Status: DC | PRN

## 2014-05-22 MED ORDER — METOPROLOL TARTRATE 100 MG PO TABS
100.0000 mg | ORAL_TABLET | Freq: Two times a day (BID) | ORAL | Status: DC
  Administered 2014-05-22 – 2014-05-24 (×5): 100 mg via ORAL
  Filled 2014-05-22 (×7): qty 1

## 2014-05-22 MED ORDER — SODIUM CHLORIDE 0.9 % IJ SOLN
3.0000 mL | Freq: Two times a day (BID) | INTRAMUSCULAR | Status: DC
  Administered 2014-05-22: 23:00:00 via INTRAVENOUS
  Administered 2014-05-22 – 2014-05-24 (×4): 3 mL via INTRAVENOUS

## 2014-05-22 MED ORDER — DOXAZOSIN MESYLATE 8 MG PO TABS
8.0000 mg | ORAL_TABLET | Freq: Every day | ORAL | Status: DC
  Administered 2014-05-22 – 2014-05-24 (×3): 8 mg via ORAL
  Filled 2014-05-22 (×3): qty 1

## 2014-05-22 MED ORDER — FUROSEMIDE 10 MG/ML IJ SOLN
60.0000 mg | Freq: Two times a day (BID) | INTRAMUSCULAR | Status: DC
  Administered 2014-05-22 – 2014-05-24 (×4): 60 mg via INTRAVENOUS
  Filled 2014-05-22 (×5): qty 6

## 2014-05-22 MED ORDER — PREDNISONE 20 MG PO TABS
60.0000 mg | ORAL_TABLET | Freq: Once | ORAL | Status: AC
  Administered 2014-05-22: 60 mg via ORAL
  Filled 2014-05-22: qty 3

## 2014-05-22 MED ORDER — NICOTINE 21 MG/24HR TD PT24
21.0000 mg | MEDICATED_PATCH | Freq: Every day | TRANSDERMAL | Status: DC | PRN
  Filled 2014-05-22: qty 1

## 2014-05-22 MED ORDER — SODIUM CHLORIDE 0.9 % IJ SOLN
3.0000 mL | Freq: Two times a day (BID) | INTRAMUSCULAR | Status: DC
  Administered 2014-05-24: 3 mL via INTRAVENOUS

## 2014-05-22 MED ORDER — INSULIN ASPART 100 UNIT/ML ~~LOC~~ SOLN
5.0000 [IU] | Freq: Once | SUBCUTANEOUS | Status: AC
  Administered 2014-05-22: 5 [IU] via SUBCUTANEOUS

## 2014-05-22 MED ORDER — ISOSORBIDE MONONITRATE ER 60 MG PO TB24
60.0000 mg | ORAL_TABLET | Freq: Every day | ORAL | Status: DC
  Administered 2014-05-22 – 2014-05-24 (×3): 60 mg via ORAL
  Filled 2014-05-22 (×3): qty 1

## 2014-05-22 MED ORDER — SODIUM CHLORIDE 0.9 % IJ SOLN: 3.0000 mL | INTRAMUSCULAR | Status: DC | PRN

## 2014-05-22 MED ORDER — BENAZEPRIL HCL 40 MG PO TABS
40.0000 mg | ORAL_TABLET | Freq: Every day | ORAL | Status: DC
  Administered 2014-05-22 – 2014-05-24 (×3): 40 mg via ORAL
  Filled 2014-05-22 (×3): qty 1

## 2014-05-22 MED ORDER — IPRATROPIUM-ALBUTEROL 0.5-2.5 (3) MG/3ML IN SOLN: 3.0000 mL | RESPIRATORY_TRACT | Status: DC | PRN

## 2014-05-22 MED ORDER — FUROSEMIDE 10 MG/ML IJ SOLN
60.0000 mg | Freq: Once | INTRAMUSCULAR | Status: DC
  Filled 2014-05-22: qty 6

## 2014-05-22 MED ORDER — COLCHICINE 0.6 MG PO TABS
0.6000 mg | ORAL_TABLET | Freq: Two times a day (BID) | ORAL | Status: DC
  Administered 2014-05-23 – 2014-05-24 (×3): 0.6 mg via ORAL
  Filled 2014-05-22 (×4): qty 1

## 2014-05-22 MED ORDER — POTASSIUM CHLORIDE CRYS ER 10 MEQ PO TBCR
10.0000 meq | EXTENDED_RELEASE_TABLET | Freq: Every day | ORAL | Status: DC
  Administered 2014-05-22 – 2014-05-24 (×3): 10 meq via ORAL
  Filled 2014-05-22 (×3): qty 1

## 2014-05-22 MED ORDER — FUROSEMIDE 10 MG/ML IJ SOLN
80.0000 mg | Freq: Once | INTRAMUSCULAR | Status: AC
  Administered 2014-05-22: 80 mg via INTRAVENOUS
  Filled 2014-05-22: qty 8

## 2014-05-22 MED ORDER — SIMVASTATIN 20 MG PO TABS
20.0000 mg | ORAL_TABLET | Freq: Every day | ORAL | Status: DC
  Administered 2014-05-22: 20 mg via ORAL
  Filled 2014-05-22 (×2): qty 1

## 2014-05-22 MED ORDER — FENTANYL CITRATE 0.05 MG/ML IJ SOLN
50.0000 ug | Freq: Once | INTRAMUSCULAR | Status: AC
  Administered 2014-05-22: 50 ug via INTRAVENOUS
  Filled 2014-05-22: qty 2

## 2014-05-22 MED ORDER — COLCHICINE 0.6 MG PO TABS
1.2000 mg | ORAL_TABLET | Freq: Once | ORAL | Status: AC
  Administered 2014-05-22: 1.2 mg via ORAL
  Filled 2014-05-22: qty 2

## 2014-05-22 MED ORDER — IPRATROPIUM-ALBUTEROL 0.5-2.5 (3) MG/3ML IN SOLN
3.0000 mL | RESPIRATORY_TRACT | Status: DC
  Administered 2014-05-22: 3 mL via RESPIRATORY_TRACT
  Filled 2014-05-22: qty 3

## 2014-05-22 MED ORDER — OXYCODONE-ACETAMINOPHEN 5-325 MG PO TABS
1.0000 | ORAL_TABLET | Freq: Four times a day (QID) | ORAL | Status: DC | PRN
  Administered 2014-05-23 – 2014-05-24 (×3): 2 via ORAL
  Filled 2014-05-22 (×3): qty 2

## 2014-05-22 MED ORDER — HYDRALAZINE HCL 100 MG PO TABS
100.0000 mg | ORAL_TABLET | Freq: Three times a day (TID) | ORAL | Status: DC
  Administered 2014-05-22 – 2014-05-24 (×7): 100 mg via ORAL
  Filled 2014-05-22 (×9): qty 1

## 2014-05-22 MED ORDER — INSULIN ASPART 100 UNIT/ML ~~LOC~~ SOLN
0.0000 [IU] | Freq: Three times a day (TID) | SUBCUTANEOUS | Status: DC
  Administered 2014-05-22: 3 [IU] via SUBCUTANEOUS
  Administered 2014-05-23: 1 [IU] via SUBCUTANEOUS

## 2014-05-22 MED ORDER — HEPARIN SODIUM (PORCINE) 5000 UNIT/ML IJ SOLN
5000.0000 [IU] | Freq: Three times a day (TID) | INTRAMUSCULAR | Status: DC
  Administered 2014-05-22 – 2014-05-24 (×7): 5000 [IU] via SUBCUTANEOUS
  Filled 2014-05-22 (×12): qty 1

## 2014-05-22 NOTE — ED Notes (Signed)
Pt from home c/o R foot and ankle swelling x1 month. Pt has swelling to top of R foot and R ankle. Pt has hx of gout and sciatica and back surgery. Pt also takes lasix for hx of CHF. Pt denies SOB or CP. Pt has been taking oxycodone for pain with no relief. Pt denies injury. Pt is A&O and in NAD. Pt wife at bedside

## 2014-05-22 NOTE — ED Provider Notes (Signed)
CSN: [id number]     Arrival date & time 05/22/14  0813 History   First MD Initiated Contact with Patient 05/22/14 [phone]     Chief Complaint  Patient presents with  . Leg Swelling  . Foot Pain     (Consider location/radiation/quality/duration/timing/severity/associated sxs/prior Treatment) HPI Comments: 76M with R leg and foot swelling, pain. Present for 1 month, worsening over past 2 weeks, then acutely worsening overnight. No trauma.   Patient is a 58 y.o. male presenting with lower extremity pain. The history is provided by the patient.  Foot Pain This is a new problem. The current episode started more than 1 week ago. The problem occurs constantly. The problem has been gradually worsening. Pertinent negatives include no chest pain, no abdominal pain, no headaches and no shortness of breath. The symptoms are aggravated by walking. Nothing relieves the symptoms. Treatments tried: Oxycodone. The treatment provided no relief.    Past Medical History  Diagnosis Date  . Renal insufficiency, mild   . OA (osteoarthritis) of knee     right; mod-severe  . DM (diabetes mellitus), type 2     mild mod sensory loss plantar feet  . OA (osteoarthritis)   . History of colonoscopy   . Peripheral autonomic neuropathy due to secondary diabetes   . Hypothyroidism     since thyroidectomy  . Anxiety     takes buspar  . Sleep apnea     on cpap  . Complication of anesthesia     pt. reports he "flat lined " during surgery in  1999, told that  he had to be revived .  Pt. reports that it was surg. to replace his knee & [facility]  on [place].  Call to [facility]- no record avail. on this pt.   Marland Kitchen HTN (hypertension)     labile, followed by Dr. Stanford Breed  . Diastolic dysfunction   . Renal insufficiency   . CHF exacerbation 03/05/2013   Past Surgical History  Procedure Laterality Date  . Total knee arthroplasty  2001    left  . Thyroidectomy, partial      remote  .  Cardiolite--no cad ef 34%  01/01/2005  . Joint replacement  2000    left knee   . Lumbar laminectomy/decompression microdiscectomy  09/25/2011    Procedure: LUMBAR LAMINECTOMY/DECOMPRESSION MICRODISCECTOMY;  Surgeon: Floyce Stakes;  Location: [place] [facility];  Service: Neurosurgery;  Laterality: Right;  Right Lumbar Five-Sacral One Microdiscectomy  . Back surgery      11/12  . Lumbar laminectomy/decompression microdiscectomy  01/03/2012    Procedure: LUMBAR LAMINECTOMY/DECOMPRESSION MICRODISCECTOMY;  Surgeon: Floyce Stakes, MD;  Location: [place] [facility];  Service: Neurosurgery;  Laterality: Right;  Right L5-S1 Redo Diskectomy   Family History  Problem Relation Age of Onset  . Diabetes Mother   . Hypertension Mother   . Aneurysm Mother   . Anesthesia problems Neg Hx   . Hypotension Neg Hx   . Malignant hyperthermia Neg Hx   . Pseudochol deficiency Neg Hx    History  Substance Use Topics  . Smoking status: Former Smoker -- 1.00 packs/day for 28 years    Types: Cigarettes    Quit date: 04/07/2013  . Smokeless tobacco: Never Used     Comment: wants to get back pain under control then wants to discuss  . Alcohol Use: No    Review of Systems  Constitutional: Negative for fever and chills.  Respiratory: Negative for shortness of breath.  Cardiovascular: Negative for chest pain.  Gastrointestinal: Negative for abdominal pain.  Neurological: Negative for headaches.  All other systems reviewed and are negative.     Allergies  Shellfish allergy  Home Medications   Prior to Admission medications   Medication Sig Start Date End Date Taking? Authorizing Provider  allopurinol (ZYLOPRIM) 100 MG tablet Take 1 tablet (100 mg total) by mouth daily. 01/13/13   Dickie La, MD  amLODipine (NORVASC) 10 MG tablet TAKE 1 TABLET (10 MG TOTAL) BY MOUTH EVERY MORNING. 05/31/13   Lelon Perla, MD  benazepril (LOTENSIN) 40 MG tablet TAKE 1 TABLET (40 MG TOTAL) BY MOUTH DAILY. 05/31/13   Lelon Perla, MD  doxazosin (CARDURA) 8 MG tablet TAKE 1 TABLET (8 MG TOTAL) BY MOUTH DAILY. 05/31/13   Lelon Perla, MD  furosemide (LASIX) 80 MG tablet Take one pill by mouth daily and may take an additional half pill daily as directed for lower extremity swelling and fluid retention 03/23/14   Dickie La, MD  hydrALAZINE (APRESOLINE) 100 MG tablet TAKE ONE TABLET BY MOUTH THREE TIMES DAILY    Lelon Perla, MD  isosorbide mononitrate (IMDUR) 60 MG 24 hr tablet Take 1 tablet (60 mg total) by mouth daily. 11/26/13   Lelon Perla, MD  Lancet Devices (ADJUSTABLE LANCING DEVICE) MISC  01/25/14   Historical Provider, MD  metFORMIN (GLUCOPHAGE) 1000 MG tablet TAKE ONE TABLET BY MOUTH TWICE DAILY 03/23/14   Dickie La, MD  metoprolol (LOPRESSOR) 100 MG tablet TAKE 1 TABLET (100 MG TOTAL) BY MOUTH 2 (TWO) TIMES DAILY.    Lelon Perla, MD  oxycodone (ROXICODONE) 30 MG immediate release tablet Take one tablet every 6 hours as needed for back pain.do not fill before May 08 2014 05/04/14   Dickie La, MD  potassium chloride (K-DUR) 10 MEQ tablet TAKE 1 TABLET BY MOUTH DAILY    Lelon Perla, MD  simvastatin (ZOCOR) 20 MG tablet Take 1 tablet (20 mg total) by mouth daily. 07/14/13   Dickie La, MD   BP 146/97  Pulse 89  Temp(Src) 99.6 F (37.6 C) (Oral)  Resp 20  SpO2 94% Physical Exam  Nursing note and vitals reviewed. Constitutional: He is oriented to person, place, and time. He appears well-developed and well-nourished. No distress.  HENT:  Head: Normocephalic and atraumatic.  Mouth/Throat: No oropharyngeal exudate.  Eyes: EOM are normal. Pupils are equal, round, and reactive to light.  Neck: Normal range of motion. Neck supple.  Cardiovascular: Normal rate and regular rhythm.  Exam reveals no friction rub.   No murmur heard. Pulmonary/Chest: Effort normal and breath sounds normal. No respiratory distress. He has no wheezes. He has no rales.  Abdominal: He exhibits no distension.  There is no tenderness. There is no rebound.  Musculoskeletal: He exhibits edema (R tibia 2+, L tibia 1+. R foot and ankle with pitting edema, none in L leg. Decreased ROM of R ankle due to swelling. No warmth  or erythema.).  Neurological: He is alert and oriented to person, place, and time.  Skin: He is not diaphoretic.    ED Course  Procedures (including critical care time) Labs Review Labs Reviewed  CBC  BASIC METABOLIC PANEL  URIC ACID    Imaging Review Dg Ankle Complete Right  05/22/2014   CLINICAL DATA:  Leg swelling.  EXAM: RIGHT ANKLE - COMPLETE 3+ VIEW  COMPARISON:  No prior.  FINDINGS: Diffuse soft tissue swelling.  No evidence of fracture dislocation. Bony density noted adjacent to the lateral aspect of the talus. This could represent secondary ossification center or old fracture. The bony this is corticated.  IMPRESSION: Diffuse soft tissue swelling is present.Diffuse osteopenia and degenerative change. No acute fracture.   Electronically Signed   By: Marcello Moores  Register   On: 05/22/2014 09:40   Dg Chest Port 1 View  05/22/2014   CLINICAL DATA:  Hypoxia.  History of CHF.  EXAM: PORTABLE CHEST - 1 VIEW  COMPARISON:  CT chest 01/31/2014 and chest radiograph 01/13/2014  FINDINGS: Heart size is enlarged and stable. Pulmonary vascularity is slightly increased. Prominence of the mid hilar, especially on the right, are consistent with enlarged central pulmonary arteries as described on prior chest CT an indicative of pulmonary arterial hypertension, in this patient with known emphysema. Interstitial thickening bilaterally may relate to chronic changes related to emphysema, but superimposed mild interstitial pulmonary edema is also considered. There is blunting of the right costophrenic angle, for which a small pleural effusion is not excluded. No acute osseous abnormality.  IMPRESSION: Cardiomegaly and pulmonary vascular congestion. Interstitial thickening bilaterally could reflect mild  interstitial edema or chronic changes related to the patient's known emphysema/COPD.  Right costophrenic blunting could reflect a small pleural effusion.   Electronically Signed   By: Curlene Dolphin M.D.   On: 05/22/2014 11:52   Dg Foot Complete Right  05/22/2014   CLINICAL DATA:  Foot pain and swelling  EXAM: RIGHT FOOT COMPLETE - 3+ VIEW  COMPARISON:  None.  FINDINGS: No fracture or dislocation of mid foot or forefoot. The phalanges are normal. The calcaneus is normal. There is soft tissue swelling of the dorsum of the foot No foreign body evident no subcutaneous gas.  IMPRESSION: Soft tissue swelling of the dorsum of foot.  No osseous abnormality.   Electronically Signed   By: Suzy Bouchard M.D.   On: 05/22/2014 09:25     EKG Interpretation   Date/Time:  Sunday May 22 2014 13:30:36 EDT Ventricular Rate:  77 PR Interval:  169 QRS Duration: 106 QT Interval:  427 QTC Calculation: 483 R Axis:   177 Text Interpretation:  Ectopic atrial rhythm Atrial premature complex Right  axis deviation Abnormal R-wave progression, late transition Borderline  prolonged QT interval similar to prior Confirmed by Mingo Amber  MD, [place]  ([id number]) on 05/22/2014 3:31:54 PM      MDM   Final diagnoses:  Hypoxia  Right leg pain    26M presents with R ankle/foot pain. Worsening over past month. Hx of gout, CHF - on diuretics. No fevers or vomiting. Denies trauma. AFVSS. R ankle and foot swollen, pulses present, good cap refill. Decreased ROM of R ankle due to swelling. 2+ edema of R leg, more swollen than left leg.  Will [place], xray, check labs. Concern for possible clot in leg, possible gout flare in R ankle. Labs show elevated uric acid, no white count. Ankle pain likely gout flare. BNP elevated. After getting some pain medicine, patient became hypoxic. Easily corrected with oxygen, likely due to over-medication with narcotics.  Once more awoke however, unable to wean from oxygen. Maintaining sats in the mid-80s on  6L. Not on oxygen at home, does wear O2 at night. Patient with persistent hypoxia, admitted to [facility]. I held off on CT PE study due to his poor renal function. Lasix given for his BNP elevation.   Osvaldo Shipper, MD 05/22/14 [phone]

## 2014-05-22 NOTE — Progress Notes (Signed)
VASCULAR LAB PRELIMINARY  PRELIMINARY  PRELIMINARY  PRELIMINARY  Right lower extremity venous duplex completed.    Preliminary report:  Right:  No evidence of DVT, superficial thrombosis, or Baker's cyst.  [name] [name], RVS 05/22/2014, 9:41 AM

## 2014-05-22 NOTE — ED Notes (Signed)
[facility] here to transfer pt to [facility]

## 2014-05-22 NOTE — ED Notes (Signed)
US at bedside

## 2014-05-22 NOTE — H&P (Signed)
[facility] Admission History and Physical Service Pager: [phone]  Patient name: [name] [name] Medical record number: [id number] Date of birth: [birth year] Age: 58 y.o. Gender: male  Primary Care Provider: Dorcas Mcmurray, MD Consultants: None Code Status: Full   Chief Complaint: Hypoxemia   Assessment and Plan: [name] [name] is a 58 y.o. male presenting with hypoxemia . PMH is significant for COPD, OSA, HTN, non ischemic cardiomyopathy, Chronic Systolic CHF, DM, HTN, CKD stage III.   #Hypoxemia - Pt developed hypoxemia while in the ED s/p receiving one dose of 50 mcg of fentanyl (Respiratory depression 2/2 opioids?  On 30 mg Oxycodone q 6 hrs PRN at home?).  He does have significant smoking history with significant COPD and OSA.  He does have hx of chronic systolic CHF with LVH causing cardiomyopathy followed by Dr. Stanford Breed in the outpt setting.  With his history and physical, favor a CHF exacerbation.   - Admit to SDU for O2 requirement, vitals per unit - Will obtain 2D Echo to evaluate for worsening EF, valvular disease with worsening BNP - If continues to be hypoxemic despite O2/diuresis, will consider CTA vs VQ scan (due to renal fxn) for PE (May be at increased risk if truly does have PCV) - Obtain ABG to evaluate for hypercapnia  - CXR/CBC does not seem to indicate any acute infection and has been afebrile.  There does seem to be some pulmonary congestion and small R pleural effusion present which could be from CHF exacerbation.  Will consider two doses of Lasix  - No CP, will obtain one troponin and EKG to make sure not with small ischemia  - Repeat BMET and CBC in AM - Continuous pulse ox, O2 > 92%  # LE edema, R>L- Most likely 2/2 gout and CHF, RLE duplex negative for DVT.  BNP elevated to 1140, up from a yr ago at 2.  Last Echo showed EF of 40-45% with LVH done in 02/2013.  Uric Acid elevated to 9.3, most likely causing this unilateral leg swelling. -  No acute evidence of DVT on duplex performed in ED - Tx with allopurinol and will start colchicine (renal dosage) in acute setting.  Start colchicine 0.6 mg BID tomorrow   # Elevated Hgb, most likely PCV 2/2 chronic hypoxia/Thrombocytopenia - Repeat CBC in AM - Consider Peripheral Smear - Can consider hematology discussion and EPO levels/Jak 2 kinase testing if appropriate - Consider Hep C/HIV testing for his thrombocytopenia  #DM II, well controlled - Last A1C 6.3 in end of April. - Hold home metformin - sensitive SSI while inpt  #HTN - Continue home medications  #OSA - Continue home CPAP  #Malodourous urine - Not c/o frank dysuria, but will get UA and tx accordingly   #COPD - Followed by Dr. Annamaria Boots in the outpatient setting - Last PFT's over 4 yrs ago, would recommend repeat upon d/c and may need controller meds  #CKD Stage III - Baseline creatinine 1.5-1.8, most likely 2/2 to DM and HTN - Repeat BMET in AM  - Monitor I/O closely  #Tobacco Abuse - nicotine patch while inpt  FEN/GI: , SLIV, heart healthy diet Prophylaxis: SQ heparin   Disposition: telemetry pending further evaluation  History of Present Illness: [name] [name] is a 58 y.o. male presenting with RLE pain found to have gout and subsequently found to have hypoxemia with new oxygen requirement (No O2 at home).  Pt states he was in his normal state of health  up until last night when he started having severe RLE pain.  He states he was unable to sleep due to the pain and started to have it on the left side as well, preventing him from sleeping at all.  He states he has had lower extremity edema now for about 4 weeks, gradually worsening, describing 2-3 pillow orthopnea, no PND, but decreased functional capacity where he would not be able to walk more than 10-20 steps w/o becoming SOB.  As well, he denies any current dyspnea at rest and no increased dyspnea on exertion at this time.  He denies any angina, any angina w/  activity, nausea, vomiting, diarrhea, abdominal pain, fever, chills, sweats, productive cough, dizziness, blurred vision, palpitations.  He is endorsing some malodorous urine but no increase in frequency, urgency, polyuria, or dysuria.    In the ED, pt had extensive w/u including RLE duplex which was negative, R ankle and foot x-rays not showing acute fx but with some soft tissue edema, CBC showing elevated Hgb to 17.5 but no leukocytosis, CXR with cardiomegaly and R small pleural effusion but no infiltrate, Pro BNP to 1124, BMET with creatinine of 1.63 (baseline 1.5-1.8) and uric acid to 9.4.  Pt was going to be sent home for gout tx after receiving one dose of prednisone and one dose of fentanyl but was then found to be hypoxemia requiring up to 6 L on venti mask to sat in the mid 80's.    Pt has extensive smoking history of around 30 pk yrs and follows with pulmonology for OSA/COPD centrilobular Emphysema.  He denies any recent travel or sick contacts.   Review Of Systems: Per HPI   Patient Active Problem List   Diagnosis Date Noted  . Hypoxia 05/22/2014  . Nonischemic cardiomyopathy 11/26/2013  . Mediastinal adenopathy 04/11/2013  . Centrilobular emphysema 03/03/2013  . Hypoxemia 03/02/2013  . Radicular low back pain 08/27/2011  . Renal insufficiency 06/20/2011  . HYPERTENSION, BENIGN SYSTEMIC 04/25/2011  . DIABETES MELLITUS, CONTROLLED, WITH COMPLICATIONS 19/41/7408  . HYPERLIPIDEMIA 11/09/2008  . OSTEOARTHRITIS 03/10/2007  . Unspecified hypothyroidism 01/08/2007  . GOUT, ACUTE 01/08/2007  . OBESITY, NOS 01/08/2007  . TOBACCO DEPENDENCE, hx of 01/08/2007  . DEPRESSIVE DISORDER, NOS 01/08/2007  . CORONARY, ARTERIOSCLEROSIS 01/08/2007  . Obstructive sleep apnea 01/08/2007   Past Medical History: Past Medical History  Diagnosis Date  . Renal insufficiency, mild   . OA (osteoarthritis) of knee     right; mod-severe  . DM (diabetes mellitus), type 2     mild mod sensory loss  plantar feet  . OA (osteoarthritis)   . History of colonoscopy   . Peripheral autonomic neuropathy due to secondary diabetes   . Hypothyroidism     since thyroidectomy  . Anxiety     takes buspar  . Sleep apnea     on cpap  . Complication of anesthesia     pt. reports he "flat lined " during surgery in  1999, told that  he had to be revived .  Pt. reports that it was surg. to replace his knee & [facility]  on [place].  [facility] to [facility]- no record avail. on this pt.   Marland Kitchen HTN (hypertension)     labile, followed by Dr. Stanford Breed  . Diastolic dysfunction   . Renal insufficiency   . CHF exacerbation 03/05/2013   Past Surgical History: Past Surgical History  Procedure Laterality Date  . Total knee arthroplasty  2001  left  . Thyroidectomy, partial      remote  . Cardiolite--no cad ef 34%  01/01/2005  . Joint replacement  2000    left knee   . Lumbar laminectomy/decompression microdiscectomy  09/25/2011    Procedure: LUMBAR LAMINECTOMY/DECOMPRESSION MICRODISCECTOMY;  Surgeon: Floyce Stakes;  Location: [place] [facility];  Service: Neurosurgery;  Laterality: Right;  Right Lumbar Five-Sacral One Microdiscectomy  . Back surgery      11/12  . Lumbar laminectomy/decompression microdiscectomy  01/03/2012    Procedure: LUMBAR LAMINECTOMY/DECOMPRESSION MICRODISCECTOMY;  Surgeon: Floyce Stakes, MD;  Location: [place] [facility];  Service: Neurosurgery;  Laterality: Right;  Right L5-S1 Redo Diskectomy   Social History: History  Substance Use Topics  . Smoking status: Former Smoker -- 1.00 packs/day for 28 years    Types: Cigarettes    Quit date: 04/07/2013  . Smokeless tobacco: Never Used     Comment: wants to get back pain under control then wants to discuss  . Alcohol Use: No   Family History: Family History  Problem Relation Age of Onset  . Diabetes Mother   . Hypertension Mother   . Aneurysm Mother   . Anesthesia problems Neg Hx   . Hypotension Neg Hx   .  Malignant hyperthermia Neg Hx   . Pseudochol deficiency Neg Hx    Allergies and Medications: Allergies  Allergen Reactions  . Shellfish Allergy Other (See Comments)    Reaction:causes gout   No current facility-administered medications on file prior to encounter.   No current outpatient prescriptions on file prior to encounter.    Objective: BP 143/87  Pulse 75  Temp(Src) 99.6 F (37.6 C) (Oral)  Resp 20  SpO2 86% Exam: General: NAD, comfortable on 4 L of [place] HEENT: [place]/AT, MMM, PERRLA, EOMI B/L Neck: + JVD to 8-9 cm B/L  Cardiovascular: RRR, no murmurs appreciated  Respiratory: CTAB, no increased WOB  Abdomen: Soft/NT/ND, NABS  Extremities: +3 pitting edema R with TTP at the ankle mortise, + 2 pitting edema L Skin: No rashes Neuro: CN 2-12, no focal deficits   Labs and Imaging: CBC BMET   Recent Labs Lab 05/22/14 0847  WBC 7.2  HGB 17.5*  HCT 52.3*  PLT 119*   BNP BNP (last 3 results)  Recent Labs  05/22/14 0830  PROBNP 1124.0*     Recent Labs Lab 05/22/14 0847  NA 142  K 4.3  CL 102  CO2 26  BUN 16  CREATININE 1.63*  GLUCOSE 160*  CALCIUM 9.4     EKG 05/22/14 - RAD, NSR with prolonged QTc to 490 ms   CXR 05/22/14- IMPRESSION:  Cardiomegaly and pulmonary vascular congestion. Interstitial  thickening bilaterally could reflect mild interstitial edema or  chronic changes related to the patient's known emphysema/COPD.  Right costophrenic blunting could reflect a small pleural effusion.   Nolon Rod, DO 05/22/2014, 12:33 PM PGY-3, [facility] Intern pager: [phone], text pages welcome

## 2014-05-22 NOTE — ED Notes (Signed)
[facility] notified to transport pt

## 2014-05-22 NOTE — ED Notes (Signed)
Per [facility], pt not appropriate for bed that was initially assigned. Nilsa Nutting called and had family practice change bed to 2 Central d/t pt O2 sats.

## 2014-05-22 NOTE — ED Notes (Signed)
Pt now 90% on 6L O2 after Duoneb. Pt is A&O and in NAD. Pt family at bedside

## 2014-05-22 NOTE — ED Notes (Signed)
Pt placed on 6L humidified O2 to keep pt at his normal O2 saturation of high 80's. Pt is A&O and in NAD

## 2014-05-22 NOTE — ED Notes (Signed)
Removed pt O2 per Dr Mingo Amber to see what pt O2 is. Pt desat to 84%. Dr Mingo Amber notified. Pt reports that because of his COPD, his PCP states that "I'm supposed to be around high 80's and 90's are too high. Dr Mingo Amber at bedside.

## 2014-05-22 NOTE — ED Notes (Signed)
Pt attempted to sign transfer form but signature pad not working. Pt signing paper and placed into chart

## 2014-05-22 NOTE — ED Notes (Signed)
Pt is now 87% on 6 L. Dr Mingo Amber aware

## 2014-05-22 NOTE — ED Notes (Addendum)
Dr Mingo Amber aware of pt O2 status and at bedside. Pt A&O and talking with no difficulty. Pt states that he uses O2 when he sleeps at night (CPAP), but no other time

## 2014-05-22 NOTE — ED Notes (Signed)
Pt given to [facility]

## 2014-05-22 NOTE — Progress Notes (Signed)
Pt was on 3L [place] at the time of ABG. RT stuck twice due to low Pa02. Both times ABG syringe quick to fill. Spo2 at time of ABG drawn was 87%. RT increased [place] to 6L [place] per ABG and will continue to monitor. RN notified about ABG results and change in Fio2.

## 2014-05-22 NOTE — ED Notes (Signed)
Patient transported to X-ray 

## 2014-05-23 ENCOUNTER — Observation Stay (HOSPITAL_COMMUNITY): Payer: Medicare Other

## 2014-05-23 DIAGNOSIS — I509 Heart failure, unspecified: Secondary | ICD-10-CM

## 2014-05-23 DIAGNOSIS — I10 Essential (primary) hypertension: Secondary | ICD-10-CM

## 2014-05-23 DIAGNOSIS — N289 Disorder of kidney and ureter, unspecified: Secondary | ICD-10-CM | POA: Diagnosis not present

## 2014-05-23 DIAGNOSIS — M109 Gout, unspecified: Secondary | ICD-10-CM

## 2014-05-23 DIAGNOSIS — I5021 Acute systolic (congestive) heart failure: Secondary | ICD-10-CM

## 2014-05-23 DIAGNOSIS — E118 Type 2 diabetes mellitus with unspecified complications: Secondary | ICD-10-CM

## 2014-05-23 DIAGNOSIS — R0902 Hypoxemia: Secondary | ICD-10-CM | POA: Diagnosis not present

## 2014-05-23 DIAGNOSIS — I059 Rheumatic mitral valve disease, unspecified: Secondary | ICD-10-CM

## 2014-05-23 DIAGNOSIS — F17200 Nicotine dependence, unspecified, uncomplicated: Secondary | ICD-10-CM

## 2014-05-23 LAB — CBC
HCT: 48.4 % (ref 39.0–52.0)
HEMOGLOBIN: 15.8 g/dL (ref 13.0–17.0)
MCH: 26.8 pg (ref 26.0–34.0)
MCHC: 32.6 g/dL (ref 30.0–36.0)
MCV: 82 fL (ref 78.0–100.0)
Platelets: 105 10*3/uL — ABNORMAL LOW (ref 150–400)
RBC: 5.9 MIL/uL — AB (ref 4.22–5.81)
RDW: 15.8 % — ABNORMAL HIGH (ref 11.5–15.5)
WBC: 7.5 10*3/uL (ref 4.0–10.5)

## 2014-05-23 LAB — COMPREHENSIVE METABOLIC PANEL
ALT: 6 U/L (ref 0–53)
ANION GAP: 16 — AB (ref 5–15)
AST: 10 U/L (ref 0–37)
Albumin: 3.2 g/dL — ABNORMAL LOW (ref 3.5–5.2)
Alkaline Phosphatase: 72 U/L (ref 39–117)
BUN: 26 mg/dL — AB (ref 6–23)
CALCIUM: 8.4 mg/dL (ref 8.4–10.5)
CO2: 25 meq/L (ref 19–32)
Chloride: 101 mEq/L (ref 96–112)
Creatinine, Ser: 1.81 mg/dL — ABNORMAL HIGH (ref 0.50–1.35)
GFR, EST AFRICAN AMERICAN: 46 mL/min — AB (ref >90)
GFR, EST NON AFRICAN AMERICAN: 40 mL/min — AB (ref >90)
GLUCOSE: 108 mg/dL — AB (ref 70–99)
Potassium: 4 mEq/L (ref 3.7–5.3)
Sodium: 142 mEq/L (ref 137–147)
Total Bilirubin: 0.6 mg/dL (ref 0.3–1.2)
Total Protein: 7.1 g/dL (ref 6.0–8.3)

## 2014-05-23 LAB — GLUCOSE, CAPILLARY
GLUCOSE-CAPILLARY: 127 mg/dL — AB (ref 70–99)
GLUCOSE-CAPILLARY: 117 mg/dL — AB (ref 70–99)
Glucose-Capillary: 157 mg/dL — ABNORMAL HIGH (ref 70–99)

## 2014-05-23 MED ORDER — BIOTENE DRY MOUTH MT LIQD
15.0000 mL | Freq: Two times a day (BID) | OROMUCOSAL | Status: DC
  Administered 2014-05-23 – 2014-05-24 (×3): 15 mL via OROMUCOSAL

## 2014-05-23 MED ORDER — TECHNETIUM TO 99M ALBUMIN AGGREGATED: 6.0000 | Freq: Once | INTRAVENOUS | Status: AC | PRN

## 2014-05-23 MED ORDER — ATORVASTATIN CALCIUM 80 MG PO TABS
80.0000 mg | ORAL_TABLET | Freq: Every day | ORAL | Status: DC
Start: 2014-05-23 — End: 2014-05-24
  Administered 2014-05-23 – 2014-05-24 (×2): 80 mg via ORAL
  Filled 2014-05-23 (×2): qty 1

## 2014-05-23 MED ORDER — INSULIN ASPART 100 UNIT/ML ~~LOC~~ SOLN
0.0000 [IU] | Freq: Three times a day (TID) | SUBCUTANEOUS | Status: DC
  Administered 2014-05-23 – 2014-05-24 (×2): 3 [IU] via SUBCUTANEOUS

## 2014-05-23 MED ORDER — TECHNETIUM TC 99M DIETHYLENETRIAME-PENTAACETIC ACID: 40.0000 | Freq: Once | INTRAVENOUS | Status: AC | PRN

## 2014-05-23 NOTE — Progress Notes (Signed)
Echocardiogram 2D Echocardiogram has been performed.  [name] [name] 05/23/2014, 11:29 AM

## 2014-05-23 NOTE — Evaluation (Signed)
Occupational Therapy Evaluation Patient Details Name: [name] [name] MRN: [id number] DOB: 1956-02-07 Today's Date: 05/23/2014    History of Present Illness Pt is a 58 y/o male presenting to the ED with complaints of a gout flare up in R ankle. While in the ED, pt became hypoxic and was put on 6L/min supplemental O2 to maintain appropriate oxygen saturation.   Clinical Impression   Patient evaluated by Occupational Therapy with no further acute OT needs identified. All education has been completed and the patient has no further questions. See below for any follow-up Occupational Therapy or equipment needs. OT to sign off. Thank you for referral.      Follow Up Recommendations  No OT follow up    Equipment Recommendations  None recommended by OT    Recommendations for Other Services  (NUTRITION!!!! needs diet modification recommendations) MD : IF YOU AGREE PLEASE ORDER!     Precautions / Restrictions Precautions Precautions: Fall      Mobility Bed Mobility Overal bed mobility: Modified Independent                Transfers                      Balance                                            ADL Overall ADL's : Modified independent (decr Oxygen saturations with ADLS)                                       General ADL Comments: Pt provided handout for energy conservation and reviewed in great detail. Pt very concerned with gout and meal options. Pt printed off a handout that describes food diet recommendations to prevent gout flare up. So requested RN gain a nutrition consult to help address diet needs and modifications to help with current admission and gout prevention     Vision                     Perception     Praxis      Pertinent Vitals/Pain VSS     Hand Dominance Right   Extremity/Trunk Assessment Upper Extremity Assessment Upper Extremity Assessment: Overall WFL for tasks assessed   Lower  Extremity Assessment Lower Extremity Assessment: Defer to PT evaluation   Cervical / Trunk Assessment Cervical / Trunk Assessment: Normal   Communication Communication Communication: No difficulties   Cognition Arousal/Alertness: Awake/alert Behavior During Therapy: WFL for tasks assessed/performed Overall Cognitive Status: Within Functional Limits for tasks assessed                     General Comments       Exercises       Shoulder Instructions      Home Living Family/patient expects to be discharged to:: Private residence Living Arrangements: Spouse/significant other Available Help at Discharge: Family;Available 24 hours/day Type of Home: Apartment Home Access: Level entry     Home Layout: One level     Bathroom Shower/Tub: Teacher, early years/pre: Standard     Home Equipment: Environmental consultant - 2 wheels;Shower seat;Bedside commode          Prior Functioning/Environment Level of Independence: Independent  Comments: Still driving, not working, Hydrographic surveyor. Had one previous gout flare up in the toe only.     OT Diagnosis:     OT Problem List:     OT Treatment/Interventions:      OT Goals(Current goals can be found in the care plan section) Acute Rehab OT Goals Patient Stated Goal: to get back to doing for myself OT Goal Formulation: With patient/family  OT Frequency:     Barriers to D/C:            Co-evaluation              End of Session Nurse Communication: Mobility status;Precautions  Activity Tolerance: Patient tolerated treatment well Patient left: in bed;with call bell/phone within reach;with family/visitor present   Time: 9244-6286 OT Time Calculation (min): 22 min Charges:  OT General Charges $OT Visit: 1 Procedure OT Evaluation $Initial OT Evaluation Tier I: 1 Procedure OT Treatments $Self Care/Home Management : 8-22 mins G-Codes: OT G-codes **NOT FOR INPATIENT CLASS** Functional Assessment Tool  Used: clinical judgement Functional Limitation: Self care Self Care Current Status (N8177): 0 percent impaired, limited or restricted Self Care Goal Status (N1657): 0 percent impaired, limited or restricted Self Care Discharge Status (X0383): 0 percent impaired, limited or restricted  Parke Poisson B 05/23/2014, 4:08 PM Pager: [phone]

## 2014-05-23 NOTE — Progress Notes (Signed)
Family Medicine Teaching Service Daily Progress Note Intern Pager: [phone]  Patient name: [name] [name] Medical record number: [id number] Date of birth: 03-Nov-1956 Age: 58 y.o. Gender: male  Primary Care Provider: Dorcas Mcmurray, MD Consultants: None Code Status: Full  Pt Overview and Major Events to Date:  7/12 Admit to SDU for hypoxemia  Assessment and Plan: [name] [name] is a 58 y.o. male presenting with hypoxemia . PMH is significant for COPD, OSA, HTN, non ischemic cardiomyopathy, Chronic Systolic CHF, DM, HTN, CKD stage III.   #Hypoxemia - Stable. pO2 40.9 on ABG.  Likely due to CHF exacerbation or PE, also considering COPD exacerbation and OSA. No signs of acute infection.  S/p IV Lasix 60mg  x1. RLE duplex negative for DVT. BNP elevated to 1140, up from a yr ago at 32. Last Echo showed EF of 40-45% with LVH done in 02/2013. - f/u 2D Echo to evaluate for worsening EF, valvular disease with worsening BNP  - f/u VQ scan (due to renal fxn) for PE (at increased risk 2/2 sedentary lifestyle, still hypoxic after Lasix)  - Continuous pulse ox, O2 > 92%, currently on 5L [place]   # Acute gout flair in RLE- Improving. Uric Acid elevated to 9.3.  - Continue colchicine 0.6 mg BID   # Elevated Hgb, Resolved. Continue to monitor.  #DM II, well controlled - Last A1C 6.3 in April.  - Hold home metformin, SSI while inpt  (increase to moderate today) - Start Lipitor 80mg , as 107yr risk is 40.4%  #HTN : Well controlled. Continue home medications   #OSA : Continue home CPAP   #COPD : Followed by Dr. Annamaria Boots in the outpatient setting  - Last PFT's over 4 yrs ago, would recommend repeat upon d/c and may need controller meds   #CKD Stage III - Baseline creatinine 1.5-1.8, most likely 2/2 to DM and HTN  - Continue to monitor Cr and I/O closely   #Tobacco Abuse: nicotine patch while inpt   FEN/GI: heart healthy diet  Prophylaxis: SQ heparin    Disposition: Pending clinical  improvement  Subjective:  Patient reports that he feels his breathing is much improved today.  He tells me that he has been wearing his CPAP with 2L O2 not only at night, but also throughout the day while sitting at home.  He denies any CP.  His swelling and pain in RLE are improving, but not gone yet.  Objective: Temp:  [97.9 F (36.6 C)-98.4 F (36.9 C)] 98.2 F (36.8 C) (07/13 1210) Pulse Rate:  [69-84] 73 (07/13 1210) Resp:  [18-25] 21 (07/13 1210) BP: (127-153)/(66-103) 129/77 mmHg (07/13 1210) SpO2:  [83 %-97 %] 90 % (07/13 1210) Weight:  [256 lb 13.4 oz (116.5 kg)-259 lb 14.8 oz (117.9 kg)] 259 lb 14.8 oz (117.9 kg) (07/13 0500) Physical Exam: General: NAD, comfortable on 5L of [place]  Neck: no JVD  Cardiovascular: RRR, no murmurs appreciated  Respiratory: CTAB, no increased WOB  Abdomen: Soft/NT/ND, NABS  Extremities: +2 pitting edema R with TTP at the ankle, +1 pitting edema L  Neuro: A&Ox3, no focal deficits    Laboratory:  Recent Labs Lab 05/22/14 0847 05/22/14 1523 05/23/14 0325  WBC 7.2 7.4 7.5  HGB 17.5* 17.4* 15.8  HCT 52.3* 52.9* 48.4  PLT 119* 92* 105*    Recent Labs Lab 05/22/14 0847 05/22/14 1523 05/23/14 0325  NA 142  --  142  K 4.3  --  4.0  CL 102  --  101  CO2 26  --  25  BUN 16  --  26*  CREATININE 1.63* 1.59* 1.81*  CALCIUM 9.4  --  8.4  PROT  --   --  7.1  BILITOT  --   --  0.6  ALKPHOS  --   --  72  ALT  --   --  6  AST  --   --  10  GLUCOSE 160*  --  108*    BNP (last 3 results)  Recent Labs  05/22/14 0830  PROBNP 1124.0*    ABG    Component Value Date/Time   PHART 7.444 05/22/2014 1538   PCO2ART 35.2 05/22/2014 1538   PO2ART 40.9* 05/22/2014 1538   HCO3 23.9 05/22/2014 1538   TCO2 25.0 05/22/2014 1538   O2SAT 78.2 05/22/2014 1538    Urinalysis    Component Value Date/Time   COLORURINE YELLOW 05/22/2014 1549   APPEARANCEUR CLEAR 05/22/2014 1549   LABSPEC 1.010 05/22/2014 1549   PHURINE 5.0 05/22/2014 1549   GLUCOSEU  NEGATIVE 05/22/2014 1549   HGBUR NEGATIVE 05/22/2014 1549   HGBUR negative 09/27/2009 1043   BILIRUBINUR NEGATIVE 05/22/2014 1549   BILIRUBINUR NEG 03/09/2014 0845   KETONESUR NEGATIVE 05/22/2014 1549   PROTEINUR NEGATIVE 05/22/2014 1549   PROTEINUR 100 03/09/2014 0845   UROBILINOGEN 0.2 05/22/2014 1549   UROBILINOGEN 0.2 03/09/2014 0845   NITRITE NEGATIVE 05/22/2014 1549   NITRITE NEG 03/09/2014 0845   LEUKOCYTESUR NEGATIVE 05/22/2014 1549    Imaging/Diagnostic Tests: EKG 05/22/14 - RAD, NSR with prolonged QTc to 490 ms   CXR 05/22/14- IMPRESSION:  Cardiomegaly and pulmonary vascular congestion. Interstitial  thickening bilaterally could reflect mild interstitial edema or  chronic changes related to the patient's known emphysema/COPD.  Right costophrenic blunting could reflect a small pleural effusion.   Lavon Paganini, MD 05/23/2014, 1:40 PM PGY-1, [place] Intern pager: [phone], text pages welcome

## 2014-05-23 NOTE — Progress Notes (Signed)
Family Medicine Teaching Service Attending Note  I discussed patient [name] [name]  with Dr. Brita Romp and reviewed their note for today.  I agree with their assessment and plan.

## 2014-05-23 NOTE — H&P (Signed)
FMTS Attending Note  I personally saw and evaluated the patient. The plan of care was discussed with the resident team. I agree with the assessment and plan as documented by the resident.   58 y/o male with PMH systolic CHF, COPD, DM, HTN, CKD Stage 3, OSA, and tobacco abuse (20 pack year history) presents with right foot swelling over the past few weeks with acute onset of right lateral/dorsal pain and redness, patient has also noticed a decrease in functional capacity over the past month, denies new sob/chest pain. Pain/redness in right foot improved overnight. Patient wore CPAP overnight and now on 4L [place]. Please refer to resident note for additional HPI.  No recent travel, no recent surgeries, no FM blood clots, patient reports compliance with home medications  Vitals: reviewed Gen: pleasant male, NAD, speaking in full sentences HEENT: normocephalic, pupils equal bilaterally, no scleral icterus, MMM, uvula midline, neck supple, no anterior or posterior cervical lymphadenopathy Cardiac: RRR, S1 and S2 present, no murmurs, no heaves/thrills Resp: faint scattered wheezes, normal work of effort Abd: soft, obese, no tenderness, normal bowel sounds Ext: 2+ edema dorsum of right foot, 1+ edema in bilateral skins, 2+ radial pulses bilaterally, unable to palpate right DP due to swelling, left DP was 2+ Skin: minimal redness of right foot, tenderness noted over lateral aspect and dorsum of foot  Reviewed lab work, imaging, and EKG  Assessment and Plan: 58 y/o male admitted with gout of right LE and dyspnea 1. Dyspnea/Hypoxia - suspect CHF exacerbation although COPD/OSA likely contributing, cardiac workup negative for acute ischemia thus far, no hypercarbia on ABG, no risk factors for DVT/PE, agree with aggressive diuresis and recheck Echocardiogram, if hypoxia persists could consider CTA chest if kidney function allows 2. Chronic CHF with acute exacerbation - restart home Imdur, Hydralazine,  Metoprolol, Benazepril, agree with IV diuresis 3. Right foot swelling/redness - improved this AM with treatment for Gout, continue cholchicine and home Allopurinol 4. COPD - duonebs PRN, patient will need repeat PFT's as outpatient to evaluate lung function and need for controller medications 5. HTN - controlled on home regimen 6. OSA - continue CPAP at night 7. Elevated HBG - in normal range this AM, suspect polycythemia from chronic hypoxia 8. Non- insulin DM - agree with ISS while hospitalized 9. AKI on CKD 3 - BUN/CR ratio suggestive of prerenal, continue to monitor CR 10. Tobacco Abuse - patient wishes to quit, declined nicotine patch  Dossie Arbour MD 6.

## 2014-05-23 NOTE — Discharge Summary (Signed)
[facility] Discharge Summary  Patient name: [name] [name] Medical record number: [id number] Date of birth: 12/18/1955 Age: 58 y.o. Gender: male Date of Admission: 05/22/2014  Date of Discharge: 05/24/2014  Admitting Physician: Lupita Dawn, MD  Primary Care Provider: Dorcas Mcmurray, MD Consultants: None  Indication for Hospitalization: Hypoxemia  Discharge Diagnoses/Problem List:  Acute on chronic hypoxic respiratory failure Acute gout flare in RLE Elevated Hgb DM II HTN OSA COPD CKD Stage III Tobacco Abuse  Disposition: Home with home O2  Discharge Condition: Stable  Discharge Exam:  Filed Vitals:   05/24/14 0800  BP: 145/98  Pulse: 79  Temp: 97.9 F (36.6 C)  Resp: 20  General: NAD, comfortable on 2.5L of [place]  Neck: no JVD  Cardiovascular: RRR, no murmurs appreciated  Respiratory: CTAB, no increased WOB  Abdomen: Soft/NT/ND, NABS  Extremities: +1 pitting edema R with TTP at the ankle, trace pitting edema L  Neuro: A&Ox3, no focal deficits     Brief Hospital Course:  [name] [name] is a 58 y.o. male presenting with hypoxemia . PMH is significant for COPD, OSA, HTN, non ischemic cardiomyopathy, Chronic Systolic CHF, DM, HTN, CKD stage III.   #Hypoxemia - Found to be hypoxic to 54% in ED.  pO2 40.9 on ABG. No signs of acute infection. S/p IV Lasix 60mg  x1. RLE duplex negative for DVT. BNP elevated to 1140, up from a yr ago at 50. Last Echo showed EF of 40-45% with LVH done in 02/2013. Repeat Echo with improved EF (55-60%) and mild LV concentric hypertrophy.  V/Q scan negative with normal perfusion and normal ventilation.  # Acute gout flair in RLE- R ankle very tender on admission.  Uric Acid elevated to 9.3. Colchicine 0.6 mg BID started and continued after d/c.  # Elevated Hgb, Hgb 17.5 on admission.  Resolved. Could be polycythemia related to COPD.   #DM II, well controlled - Last A1C 6.3 in April. Home metformin held while inpatient,  CBGs well controlled on moderate SSI.  Lipitor 80mg  started 7/13, as ASCVD 29yr risk is 40.4%.  #COPD : Followed by Dr. Annamaria Boots in the outpatient setting.  Not on controller medications.  Hypoxemia is likely related.  #CKD Stage III - Creatinine at baseline of 1.5-1.8 during admission.   Other chronic medical conditions were treated with home medications.   Issues for Follow Up:  1. Acute on Chronic Respiratory failure with hypoxemia - Patient sent home on 2.5L [place].  Most likely chronically hypoxic (note from pulmonologist in 01/2014 show O2 sat 78%, and patient using CPAP + 2L O2 throughout day xmonths).  Should f/u w pulmonologist for further w/u and adjustment of home O2.  2. COPD - Last PFTs over 4 yrs ago.  Consider repeat as outpatient.  Patient takes no controller meds.  As he is now requiring continuous oxygen, he likely needs to be on controller medications.  3. Tobacco abuse - Consider nicotine patch as outpatient.  Consider referral to Dr. Graylin Shiver clinic for smoking cessation.  4. Gout flare - Patient set home on colchicine 0.6mg  BID.  After flare is improved, consider slow taper of colchicine.  Consider adding allopurinol for maintenance or switching ACEi to Losartan for decreasing uric acid level.   Significant Procedures: None  Significant Labs and Imaging:   Recent Labs Lab 05/22/14 1523 05/23/14 0325 05/24/14 0321  WBC 7.4 7.5 4.9  HGB 17.4* 15.8 16.1  HCT 52.9* 48.4 49.6  PLT 92* 105* 98*  Recent Labs Lab 05/22/14 0847 05/22/14 1523 05/23/14 0325 05/24/14 0321  NA 142  --  142 141  K 4.3  --  4.0 3.8  CL 102  --  101 100  CO2 26  --  25 24  GLUCOSE 160*  --  108* 107*  BUN 16  --  26* 31*  CREATININE 1.63* 1.59* 1.81* 1.81*  CALCIUM 9.4  --  8.4 8.3*  ALKPHOS  --   --  72  --   AST  --   --  10  --   ALT  --   --  6  --   ALBUMIN  --   --  3.2*  --    BNP (last 3 results)  Recent Labs  05/22/14 0830  PROBNP 1124.0*    ABG    Component  Value Date/Time   PHART 7.444 05/22/2014 1538   PCO2ART 35.2 05/22/2014 1538   PO2ART 40.9* 05/22/2014 1538   HCO3 23.9 05/22/2014 1538   TCO2 25.0 05/22/2014 1538   O2SAT 78.2 05/22/2014 1538    Urinalysis    Component Value Date/Time   COLORURINE YELLOW 05/22/2014 1549   APPEARANCEUR CLEAR 05/22/2014 1549   LABSPEC 1.010 05/22/2014 1549   PHURINE 5.0 05/22/2014 1549   GLUCOSEU NEGATIVE 05/22/2014 1549   HGBUR NEGATIVE 05/22/2014 1549   HGBUR negative 09/27/2009 1043   BILIRUBINUR NEGATIVE 05/22/2014 1549   BILIRUBINUR NEG 03/09/2014 0845   KETONESUR NEGATIVE 05/22/2014 1549   PROTEINUR NEGATIVE 05/22/2014 1549   PROTEINUR 100 03/09/2014 0845   UROBILINOGEN 0.2 05/22/2014 1549   UROBILINOGEN 0.2 03/09/2014 0845   NITRITE NEGATIVE 05/22/2014 1549   NITRITE NEG 03/09/2014 0845   LEUKOCYTESUR NEGATIVE 05/22/2014 1549    EKG 05/22/14 - RAD, NSR with prolonged QTc to 490 ms   CXR 05/22/14- IMPRESSION:  Cardiomegaly and pulmonary vascular congestion. Interstitial  thickening bilaterally could reflect mild interstitial edema or  chronic changes related to the patient's known emphysema/COPD.  Right costophrenic blunting could reflect a small pleural effusion.  Echo (7/13) : EF 55-60%, mild LV concentric hypertrophy, normal diastolic function, mild MR, mild LA dilation   V/Q Scan (7/13) : Ventilation: No focal ventilation defect. Perfusion: No wedge shaped peripheral perfusion defects to suggest  acute pulmonary embolism. IMPRESSION: Normal perfusion scan   Results/Tests Pending at Time of Discharge:  None  Discharge Medications:    Medication List    STOP taking these medications       simvastatin 20 MG tablet  Commonly known as:  ZOCOR      TAKE these medications       amLODipine 10 MG tablet  Commonly known as:  NORVASC  Take 10 mg by mouth daily.     atorvastatin 80 MG tablet  Commonly known as:  LIPITOR  Take 1 tablet (80 mg total) by mouth daily at 6 PM.     benazepril 40 MG  tablet  Commonly known as:  LOTENSIN  Take 40 mg by mouth daily.     colchicine 0.6 MG tablet  Take 1 tablet (0.6 mg total) by mouth 2 (two) times daily.     doxazosin 8 MG tablet  Commonly known as:  CARDURA  Take 8 mg by mouth daily.     furosemide 80 MG tablet  Commonly known as:  LASIX  Take 40-80 mg by mouth daily as needed for fluid.     hydrALAZINE 100 MG tablet  Commonly known as:  APRESOLINE  Take 100  mg by mouth 3 (three) times daily.     isosorbide mononitrate 60 MG 24 hr tablet  Commonly known as:  IMDUR  Take 60 mg by mouth daily.     metFORMIN 1000 MG tablet  Commonly known as:  GLUCOPHAGE  Take 1,000 mg by mouth 2 (two) times daily with a meal.     metoprolol 100 MG tablet  Commonly known as:  LOPRESSOR  Take 100 mg by mouth 2 (two) times daily.     oxycodone 30 MG immediate release tablet  Commonly known as:  ROXICODONE  Take 30 mg by mouth every 6 (six) hours as needed for pain.     potassium chloride 10 MEQ tablet  Commonly known as:  K-DUR,KLOR-CON  Take 10 mEq by mouth daily.        Discharge Instructions: Please refer to Patient Instructions section of EMR for full details.  Patient was counseled important signs and symptoms that should prompt return to medical care, changes in medications, dietary instructions, activity restrictions, and follow up appointments.   Follow-Up Appointments: Follow-up Information   Follow up with Nobie Putnam, DO On 05/31/2014. (Appt made at 2:30 for hospital f/u.)    Specialty:  Osteopathic Medicine   Contact information:   [place] [address] [phone]       Lavon Paganini, MD 05/24/2014, 11:40 AM PGY-1, [place]

## 2014-05-23 NOTE — Evaluation (Signed)
Physical Therapy Evaluation Patient Details Name: [name] [name] MRN: [id number] DOB: 11/05/1956 Today's Date: 05/23/2014   History of Present Illness  Pt is a 58 y/o male presenting to the ED with complaints of a gout flare up in R ankle. While in the ED, pt became hypoxic and was put on 6L/min supplemental O2 to maintain appropriate oxygen saturation.  Clinical Impression  Pt admitted with the above. Pt currently with functional limitations due to the deficits listed below (see PT Problem List). At the time of PT eval pt was able to perform transfers well, with minimal weightbearing through the RLE due to pain from gout. O2 was monitored closely as pt was on 6L/min supplemental O2 and sats dropped as low as 84% with mobility. Pt will benefit from skilled PT to increase their independence and safety with mobility to allow discharge to the venue listed below.       Follow Up Recommendations No PT follow up    Equipment Recommendations  None recommended by PT    Recommendations for Other Services       Precautions / Restrictions Precautions Precautions: Fall Restrictions Weight Bearing Restrictions: No      Mobility  Bed Mobility Overal bed mobility: Modified Independent Bed Mobility: Supine to Sit           General bed mobility comments: HOB slightly elevated. Pt was able to transition to full sitting position with increased time due to guarding of RLE.   Transfers Overall transfer level: Needs assistance Equipment used: Rolling walker (2 wheeled) Transfers: Sit to/from Stand Sit to Stand: Supervision         General transfer comment: VC's for hand placement on seated surface for safety. Pt able to power-up to full standing without physical assist. Supervision for safety as pt is very usnteady due to attempting to keep weight off the R ankle.  Ambulation/Gait Ambulation/Gait assistance: Min guard Ambulation Distance (Feet): 3 Feet Assistive device: Rolling walker  (2 wheeled) Gait Pattern/deviations: Step-to pattern;Decreased stride length Gait velocity: Decreased Gait velocity interpretation: Below normal speed for age/gender General Gait Details: Pt was able to take 2-3 steps towards the recliner before turning to sit. Mostly TTWB due to pain, however pt managed well with the RW. Gait training limited due to O2 saturation drop into low-mid 80's with mobility while on supplemental O2.   Stairs            Wheelchair Mobility    Modified Rankin (Stroke Patients Only)       Balance                                             Pertinent Vitals/Pain See above for O2 sats during session.     Home Living Family/patient expects to be discharged to:: Private residence Living Arrangements: Spouse/significant other Available Help at Discharge: Family;Available 24 hours/day Type of Home: Apartment Home Access: Level entry     Home Layout: One level Home Equipment: Walker - 2 wheels;Shower seat;Bedside commode      Prior Function Level of Independence: Independent         Comments: Still driving, not working, [employer]. Had one previous gout flare up in the toe only.      Hand Dominance   Dominant Hand: Right    Extremity/Trunk Assessment   Upper Extremity Assessment: Defer to OT evaluation  Lower Extremity Assessment: RLE deficits/detail RLE Deficits / Details: Decreased strength and AROM consistent with gout flare up in R ankle.    Cervical / Trunk Assessment: Normal  Communication   Communication: No difficulties  Cognition Arousal/Alertness: Awake/alert Behavior During Therapy: WFL for tasks assessed/performed Overall Cognitive Status: Within Functional Limits for tasks assessed                      General Comments      Exercises General Exercises - Lower Extremity Ankle Circles/Pumps: AROM;15 reps      Assessment/Plan    PT Assessment Patient needs  continued PT services  PT Diagnosis Difficulty walking;Acute pain   PT Problem List Decreased strength;Decreased activity tolerance;Decreased range of motion;Decreased balance;Decreased mobility;Decreased knowledge of use of DME;Decreased safety awareness;Decreased knowledge of precautions;Cardiopulmonary status limiting activity  PT Treatment Interventions DME instruction;Gait training;Stair training;Functional mobility training;Therapeutic activities;Therapeutic exercise;Neuromuscular re-education;Patient/family education   PT Goals (Current goals can be found in the Care Plan section) Acute Rehab PT Goals Patient Stated Goal: To decrease pain and return to PLOF PT Goal Formulation: With patient/family Time For Goal Achievement: 05/30/14 Potential to Achieve Goals: Good    Frequency Min 3X/week   Barriers to discharge        Co-evaluation               End of Session Equipment Utilized During Treatment: Gait belt;Oxygen Activity Tolerance: Patient limited by pain Patient left: in chair;with call bell/phone within reach;with family/visitor present Nurse Communication: Mobility status    Functional Assessment Tool Used: Clinical judgement Functional Limitation: Mobility: Walking and moving around Mobility: Walking and Moving Around Current Status (S2831): At least 20 percent but less than 40 percent impaired, limited or restricted Mobility: Walking and Moving Around Goal Status [phone]): At least 20 percent but less than 40 percent impaired, limited or restricted    Time: 0931-1001 PT Time Calculation (min): 30 min   Charges:   PT Evaluation $Initial PT Evaluation Tier I: 1 Procedure PT Treatments $Therapeutic Activity: 23-37 mins   PT G Codes:   Functional Assessment Tool Used: Clinical judgement Functional Limitation: Mobility: Walking and moving around    [place] 05/23/2014, 12:19 PM  Jolyn Lent, PT, DPT Acute Rehabilitation Services Pager:  [phone]

## 2014-05-24 DIAGNOSIS — R0902 Hypoxemia: Secondary | ICD-10-CM

## 2014-05-24 LAB — CBC
HCT: 49.6 % (ref 39.0–52.0)
Hemoglobin: 16.1 g/dL (ref 13.0–17.0)
MCH: 26.7 pg (ref 26.0–34.0)
MCHC: 32.5 g/dL (ref 30.0–36.0)
MCV: 82.4 fL (ref 78.0–100.0)
PLATELETS: 98 10*3/uL — AB (ref 150–400)
RBC: 6.02 MIL/uL — ABNORMAL HIGH (ref 4.22–5.81)
RDW: 15.5 % (ref 11.5–15.5)
WBC: 4.9 10*3/uL (ref 4.0–10.5)

## 2014-05-24 LAB — BASIC METABOLIC PANEL
ANION GAP: 17 — AB (ref 5–15)
BUN: 31 mg/dL — ABNORMAL HIGH (ref 6–23)
CALCIUM: 8.3 mg/dL — AB (ref 8.4–10.5)
CO2: 24 mEq/L (ref 19–32)
Chloride: 100 mEq/L (ref 96–112)
Creatinine, Ser: 1.81 mg/dL — ABNORMAL HIGH (ref 0.50–1.35)
GFR, EST AFRICAN AMERICAN: 46 mL/min — AB (ref >90)
GFR, EST NON AFRICAN AMERICAN: 40 mL/min — AB (ref >90)
GLUCOSE: 107 mg/dL — AB (ref 70–99)
Potassium: 3.8 mEq/L (ref 3.7–5.3)
SODIUM: 141 meq/L (ref 137–147)

## 2014-05-24 LAB — GLUCOSE, CAPILLARY
GLUCOSE-CAPILLARY: 187 mg/dL — AB (ref 70–99)
Glucose-Capillary: 98 mg/dL (ref 70–99)
Glucose-Capillary: 153 mg/dL — ABNORMAL HIGH (ref 70–99)
Glucose-Capillary: 150 mg/dL — ABNORMAL HIGH (ref 70–99)

## 2014-05-24 MED ORDER — ATORVASTATIN CALCIUM 80 MG PO TABS: 80.0000 mg | ORAL_TABLET | Freq: Every day | ORAL | Status: DC

## 2014-05-24 MED ORDER — COLCHICINE 0.6 MG PO TABS: 0.6000 mg | ORAL_TABLET | Freq: Two times a day (BID) | ORAL | Status: DC

## 2014-05-24 NOTE — Care Management Note (Signed)
    Page 1 of 1   05/24/2014     3:36:33 PM CARE MANAGEMENT NOTE 05/24/2014  Patient:  [name] [name], [name] [name]   Account Number:  [id number]  Date Initiated:  05/24/2014  Documentation initiated by:  Cyrstal Leitz  Subjective/Objective Assessment:   dx hypoxia; lives with spouse, has CPAP through [employer] [place] consult      DME arranged  [facility]      DME agency  [place].       Status of service:  Completed, signed off Medicare Important Message given?  NA - LOS <3 / Initial given by admissions (If response is "NO", the following Medicare IM given date fields will be blank) Date Medicare IM given:   Medicare IM given by:   Date Additional Medicare IM given:   Additional Medicare IM given by:    Discharge Disposition:  HOME/SELF CARE  Per UR Regulation:  Reviewed for med. necessity/level of care/duration of stay

## 2014-05-24 NOTE — Progress Notes (Signed)
05/24/2014 physical therapy working with patient at 1055. From chair to door on 2.5 Liters patient ambulated about 20 feet saturation was 84. Without oxygen from the bed to the recliner at about 10 feet patient oxygen was at 90%, then went down to 86%. [facility] RN.

## 2014-05-24 NOTE — Progress Notes (Signed)
Family Medicine Teaching Service Attending Note  I interviewed and examined patient [name] [name] and reviewed their tests and x-rays.  I discussed with Dr. Brita Romp and reviewed their note for today.  I agree with their assessment and plan.     Additionally  Feels well this AM Foot is much better When I stop his O2 his pulse ox on his ear drops to 87.  He is able to get up and hobble around and does not feel shortness of breath   Likely has been chronically hypoxic.  Reading from pulmonologist shows this.   No signs of acute disease Ok to discharge on O2 and follow up with pulmonary for other work up and adjustment

## 2014-05-24 NOTE — Progress Notes (Signed)
SATURATION QUALIFICATIONS: (This note is used to comply with regulatory documentation for home oxygen)  Patient Saturations on Room Air at Rest = 87-90%  Patient Saturations on Room Air while Ambulating = 87-88%  Patient Saturations on 2 Liters of oxygen while Ambulating = 90-91%  Please briefly explain why patient needs home oxygen: Hypoxia

## 2014-05-24 NOTE — Progress Notes (Signed)
Physical Therapy Treatment Patient Details Name: [name] [name] MRN: [id number] DOB: 1956-06-05 Today's Date: 05/24/2014    History of Present Illness Pt is a 58 y/o male presenting to the ED with complaints of a gout flare up in R ankle. While in the ED, pt became hypoxic and was put on 6L/min supplemental O2 to maintain appropriate oxygen saturation.    PT Comments    Pt progressing towards physical therapy goals. Reports decreased pain with mobility in the R ankle, and was able to increase weight bearing throughout session. Limited by desaturation of O2 during activity, and pt was frequently cued for pursed-lip breathing. Will continue to follow, and continue to anticipate that pt will not require any further PT needs at d/c.  Follow Up Recommendations  No PT follow up     Equipment Recommendations  None recommended by PT    Recommendations for Other Services       Precautions / Restrictions Precautions Precautions: Fall Restrictions Weight Bearing Restrictions: No    Mobility  Bed Mobility Overal bed mobility: Modified Independent Bed Mobility: Supine to Sit           General bed mobility comments: HOB slightly elevated. Pt was able to transition to full sitting position in less time than previous session. Is not guarding foot as much and states pain is less.   Transfers Overall transfer level: Needs assistance Equipment used: Rolling walker (2 wheeled) Transfers: Sit to/from Stand Sit to Stand: Modified independent (Device/Increase time)         General transfer comment: Pt able to power-up to full standing with decreased unsteadiness as well as no use of UE's.  Ambulation/Gait Ambulation/Gait assistance: Supervision Ambulation Distance (Feet): 32 Feet (20', seated rest, 12') Assistive device: None Gait Pattern/deviations: Step-through pattern;Decreased stance time - right;Trunk flexed Gait velocity: Decreased Gait velocity interpretation: Below normal  speed for age/gender General Gait Details: Pt ambulated 20' on 2.5L/min supplemental O2 and sats decreased to 84%. After seated rest break sats had improved to 90%. Supplemental O2 doffed per RN request and pt ambulated 12' to recliner chair on RA. Sats decreased to 86%. Supplemental O2 donned again at end of session.    Stairs            Wheelchair Mobility    Modified Rankin (Stroke Patients Only)       Balance                                    Cognition Arousal/Alertness: Awake/alert Behavior During Therapy: WFL for tasks assessed/performed Overall Cognitive Status: Within Functional Limits for tasks assessed                      Exercises      General Comments General comments (skin integrity, edema, etc.): Limited on the [place] due to R ankle pain.      Pertinent Vitals/Pain See gait training details for O2 saturations on/off supplemental O2.     Home Living                      Prior Function            PT Goals (current goals can now be found in the care plan section) Acute Rehab PT Goals Patient Stated Goal: to get back to doing for myself PT Goal Formulation: With patient/family Time For Goal Achievement: 05/30/14 Potential to  Achieve Goals: Good Progress towards PT goals: Progressing toward goals    Frequency  Min 3X/week    PT Plan Current plan remains appropriate    Co-evaluation             End of Session Equipment Utilized During Treatment: Gait belt;Oxygen Activity Tolerance: Patient limited by pain Patient left: in chair;with call bell/phone within reach;with family/visitor present     Time: 1025-1055 PT Time Calculation (min): 30 min  Charges:  $Gait Training: 8-22 mins $Therapeutic Activity: 8-22 mins                    G Codes:      Jolyn Lent May 28, 2014, 1:52 PM  Jolyn Lent, PT, DPT Acute Rehabilitation Services Pager: [phone]

## 2014-05-24 NOTE — Discharge Instructions (Signed)
You were admitted after being found to be hypoxemic (having low oxygen levels in your blood) when you came in for an acute gout flare.  We know that your heart failure is better than it was previously.  You do not have a blood clot in your lungs.  We think that you have a chronic need for oxygen during the day because of your COPD that was caused by smoking.  You are being sent home on oxygen and a medication called colchicine for your gout.  Hypoxemia Hypoxemia occurs when your blood does not contain enough oxygen. The body cannot work well when it does not have enough oxygen because every part of your body needs oxygen. Oxygen travels to all parts of the body through your blood. Hypoxemia can develop suddenly or can come on slowly. CAUSES Some common causes of hypoxemia include:  Long-term (chronic) lung diseases, such as chronic obstructive pulmonary disease (COPD) or interstitial lung disease.  Disorders that affect breathing at night, such as sleep apnea.  Fluid buildup in your lungs (pulmonary edema).  Lung infection (pneumonia).  Lung or throat cancer.  Abnormal blood flow that bypasses the lungs (shunt).  Certain diseasesthat affect nerves or muscles.  A collapsed lung (pneumothorax).  A blood clot in the lungs (pulmonary embolus).  Certain types of heart disease.  Slow or shallow breathing (hypoventilation).  Certain medicines.  High altitudes.  Toxic chemicals and gases. SIGNS AND SYMPTOMS Not everyone who has hypoxemia will develop symptoms. If the hypoxemia developed quickly, you will likely have symptoms such as shortness of breath. If the hypoxemia came on slowly over months or years, you may not notice any symptoms. Symptoms can include:  Shortness of breath (dyspnea).  Bluish color of the skin, lips, or nail beds.  Breathing that is fast, noisy, or shallow.  A fast heartbeat.  Feeling tired or sleepy.  Being confused or feeling  anxious. DIAGNOSIS To determine if you have hypoxemia, your health care provider may perform:  A physical exam.  Blood tests.  A pulse oximetry. A sensor will be put on your finger, toe, or earlobe to measure the percent of oxygen in your blood. TREATMENT You will likely be treated with oxygen therapy. Depending on the cause of your hypoxemia, you may need oxygen for a short time (weeks or months), or you may need it indefinitely. Your health care provider may also recommend other therapies to treat the underlying cause of your hypoxemia. HOME CARE INSTRUCTIONS  Only take over-the-counter or prescription medicines as directed by your health care provider.  Follow oxygen safety measures if you are on oxygen therapy. These may include:  Always having a backup supply of oxygen.  Not allowing anyone to smoke around oxygen.  Handling the oxygen tanks carefully and as instructed.  If you smoke, quit. Stay away from people who smoke.  Follow up with your health care provider as directed. SEEK MEDICAL CARE IF:  You have any concerns about your oxygen therapy.  You still have trouble breathing.  You become short of breath when you exercise.  You are tired when you wake up.  You have a headache when you wake up. SEEK IMMEDIATE MEDICAL CARE IF:   Your breathing gets worse.  You have new shortness of breath with normal activity.  You have a bluish color of the skin, lips, or nail beds.  You have confusion or cloudy thinking.  You cough up dark mucus.  You have chest pain.  You have a  fever. MAKE SURE YOU:  Understand these instructions.  Will watch your condition.  Will get help right away if you are not doing well or get worse. Document Released: 05/13/2011 Document Revised: 11/02/2013 Document Reviewed: 05/27/2013 [facility] Patient Information 2015 [place], [place]. This information is not intended to replace advice given to you by your health care provider. Make sure  you discuss any questions you have with your health care provider.   Smoking Cessation Quitting smoking is important to your health and has many advantages. However, it is not always easy to quit since nicotine is a very addictive drug. Often times, people try 3 times or more before being able to quit. This document explains the best ways for you to prepare to quit smoking. Quitting takes hard work and a lot of effort, but you can do it. ADVANTAGES OF QUITTING SMOKING  You will live longer, feel better, and live better.  Your body will feel the impact of quitting smoking almost immediately.  Within 20 minutes, blood pressure decreases. Your pulse returns to its normal level.  After 8 hours, carbon monoxide levels in the blood return to normal. Your oxygen level increases.  After 24 hours, the chance of having a heart attack starts to decrease. Your breath, hair, and body stop smelling like smoke.  After 48 hours, damaged nerve endings begin to recover. Your sense of taste and smell improve.  After 72 hours, the body is virtually free of nicotine. Your bronchial tubes relax and breathing becomes easier.  After 2 to 12 weeks, lungs can hold more air. Exercise becomes easier and circulation improves.  The risk of having a heart attack, stroke, cancer, or lung disease is greatly reduced.  After 1 year, the risk of coronary heart disease is cut in half.  After 5 years, the risk of stroke falls to the same as a nonsmoker.  After 10 years, the risk of lung cancer is cut in half and the risk of other cancers decreases significantly.  After 15 years, the risk of coronary heart disease drops, usually to the level of a nonsmoker.  If you are pregnant, quitting smoking will improve your chances of having a healthy baby.  The people you live with, especially any children, will be healthier.  You will have extra money to spend on things other than cigarettes. QUESTIONS TO THINK ABOUT BEFORE  ATTEMPTING TO QUIT You may want to talk about your answers with your caregiver.  Why do you want to quit?  If you tried to quit in the past, what helped and what did not?  What will be the most difficult situations for you after you quit? How will you plan to handle them?  Who can help you through the tough times? Your family? Friends? A caregiver?  What pleasures do you get from smoking? What ways can you still get pleasure if you quit? Here are some questions to ask your caregiver:  How can you help me to be successful at quitting?  What medicine do you think would be best for me and how should I take it?  What should I do if I need more help?  What is smoking withdrawal like? How can I get information on withdrawal? GET READY  Set a quit date.  Change your environment by getting rid of all cigarettes, ashtrays, matches, and lighters in your home, car, or work. Do not let people smoke in your home.  Review your past attempts to quit. Think about what worked  and what did not. GET SUPPORT AND ENCOURAGEMENT You have a better chance of being successful if you have help. You can get support in many ways.  Tell your family, friends, and co-workers that you are going to quit and need their support. Ask them not to smoke around you.  Get individual, group, or telephone counseling and support. Programs are available at [employer] and health centers. Call your local health department for information about programs in your area.  Spiritual beliefs and practices may help some smokers quit.  Download a "quit meter" on your computer to keep track of quit statistics, such as how long you have gone without smoking, cigarettes not smoked, and money saved.  Get a self-help book about quitting smoking and staying off of tobacco. [place] yourself from urges to smoke. Talk to someone, go for a walk, or occupy your time with a task.  Change your normal  routine. Take a different route to work. Drink tea instead of coffee. Eat breakfast in a different place.  Reduce your stress. Take a hot bath, exercise, or read a book.  Plan something enjoyable to do every day. Reward yourself for not smoking.  Explore interactive web-based programs that specialize in helping you quit. GET MEDICINE AND USE IT CORRECTLY Medicines can help you stop smoking and decrease the urge to smoke. Combining medicine with the above behavioral methods and support can greatly increase your chances of successfully quitting smoking.  Nicotine replacement therapy helps deliver nicotine to your body without the negative effects and risks of smoking. Nicotine replacement therapy includes nicotine gum, lozenges, inhalers, nasal sprays, and skin patches. Some may be available over-the-counter and others require a prescription.  Antidepressant medicine helps people abstain from smoking, but how this works is unknown. This medicine is available by prescription.  Nicotinic receptor partial agonist medicine simulates the effect of nicotine in your brain. This medicine is available by prescription. Ask your caregiver for advice about which medicines to use and how to use them based on your health history. Your caregiver will tell you what side effects to look out for if you choose to be on a medicine or therapy. Carefully read the information on the package. Do not use any other product containing nicotine while using a nicotine replacement product.  RELAPSE OR DIFFICULT SITUATIONS Most relapses occur within the first 3 months after quitting. Do not be discouraged if you start smoking again. Remember, most people try several times before finally quitting. You may have symptoms of withdrawal because your body is used to nicotine. You may crave cigarettes, be irritable, feel very hungry, cough often, get headaches, or have difficulty concentrating. The withdrawal symptoms are only temporary.  They are strongest when you first quit, but they will go away within 10-14 days. To reduce the chances of relapse, try to:  Avoid drinking alcohol. Drinking lowers your chances of successfully quitting.  Reduce the amount of caffeine you consume. Once you quit smoking, the amount of caffeine in your body increases and can give you symptoms, such as a rapid heartbeat, sweating, and anxiety.  Avoid smokers because they can make you want to smoke.  Do not let weight gain distract you. Many smokers will gain weight when they quit, usually less than 10 pounds. Eat a healthy diet and stay active. You can always lose the weight gained after you quit.  Find ways to improve your mood other than smoking. FOR MORE INFORMATION  www.smokefree.gov  Document Released: 10/22/2001 Document Revised: 04/28/2012 Document Reviewed: 02/06/2012 [facility] Patient Information 2015 [place], [place]. This information is not intended to replace advice given to you by your health care provider. Make sure you discuss any questions you have with your health care provider.

## 2014-05-24 NOTE — Progress Notes (Signed)
Family Medicine Teaching Service Daily Progress Note Intern Pager: [phone]  Patient name: [name] [name] Medical record number: [id number] Date of birth: 24-Sep-1956 Age: 58 y.o. Gender: male  Primary Care Provider: Dorcas Mcmurray, MD Consultants: None Code Status: Full  Pt Overview and Major Events to Date:  7/12 Admit to SDU for hypoxemia 7/13: Echo with EF 55-60%, mild LV concentric hypertrophy, normal diastolic function  Assessment and Plan: [name] [name] is a 58 y.o. male presenting with hypoxemia . PMH is significant for COPD, OSA, HTN, non ischemic cardiomyopathy, Chronic Systolic CHF, DM, HTN, CKD stage III.   #Acute on Chronic respiratory failure  With hypoxemia - Stable. pO2 40.9 on ABG.  S/p IV Lasix 60mg  x1. BNP elevated to 1140, up from a yr ago at 42. Last Echo showed EF of 40-45% with LVH done in 02/2013. - Echo with improved EF 55-60%, mild LV concentric hypertrophy, normal diastolic function, mild MR, mild LA dilation  - V/Q scan negative for PE. - Continuous pulse ox, O2 88-92%, currently on 2.5L [place]   # Acute gout flair in RLE- Improving. Uric Acid elevated to 9.3.  - Continue colchicine 0.6 mg BID   # Elevated Hgb, Resolved. Continue to monitor.  #DM II, well controlled - Last A1C 6.3 in April.  - Hold home metformin, moderate SSI while inpt - Continue Lipitor 80mg , as 9yr risk is 40.4%  #HTN : Well controlled. Continue home medications   #OSA : Continue home CPAP   #COPD : Followed by Dr. Annamaria Boots in the outpatient setting  - Last PFT's over 4 yrs ago, would recommend repeat upon d/c and may need controller meds   #CKD Stage III - Baseline creatinine 1.5-1.8, most likely 2/2 to DM and HTN. Cr 1.8 today. - Continue to monitor Cr and I/O closely   #Tobacco Abuse: nicotine patch while inpt   FEN/GI: heart healthy diet  Prophylaxis: SQ heparin    Disposition: Pending clinical improvement  Subjective:  Patient reports that he feels his breathing is much  improved today.  He denies any CP.  His swelling and pain in RLE are improving, but not gone yet. He reports being woken up all night by alarms and having to move around telemetry wires.  Objective: Temp:  [97.9 F (36.6 C)-98.6 F (37 C)] 97.9 F (36.6 C) (07/14 0345) Pulse Rate:  [63-83] 67 (07/14 0603) Resp:  [17-25] 21 (07/14 0603) BP: (120-148)/(71-103) 129/94 mmHg (07/14 0603) SpO2:  [90 %-100 %] 96 % (07/14 0603) Weight:  [259 lb 0.7 oz (117.5 kg)] 259 lb 0.7 oz (117.5 kg) (07/14 0345) Physical Exam: General: NAD, comfortable on 2.5L of [place]  Neck: no JVD  Cardiovascular: RRR, no murmurs appreciated  Respiratory: CTAB, no increased WOB  Abdomen: Soft/NT/ND, NABS  Extremities: +1 pitting edema R with TTP at the ankle, trace pitting edema L  Neuro: A&Ox3, no focal deficits    Laboratory:  Recent Labs Lab 05/22/14 1523 05/23/14 0325 05/24/14 0321  WBC 7.4 7.5 4.9  HGB 17.4* 15.8 16.1  HCT 52.9* 48.4 49.6  PLT 92* 105* 98*    Recent Labs Lab 05/22/14 0847 05/22/14 1523 05/23/14 0325 05/24/14 0321  NA 142  --  142 141  K 4.3  --  4.0 3.8  CL 102  --  101 100  CO2 26  --  25 24  BUN 16  --  26* 31*  CREATININE 1.63* 1.59* 1.81* 1.81*  CALCIUM 9.4  --  8.4 8.3*  PROT  --   --  7.1  --   BILITOT  --   --  0.6  --   ALKPHOS  --   --  72  --   ALT  --   --  6  --   AST  --   --  10  --   GLUCOSE 160*  --  108* 107*    BNP (last 3 results)  Recent Labs  05/22/14 0830  PROBNP 1124.0*    ABG    Component Value Date/Time   PHART 7.444 05/22/2014 1538   PCO2ART 35.2 05/22/2014 1538   PO2ART 40.9* 05/22/2014 1538   HCO3 23.9 05/22/2014 1538   TCO2 25.0 05/22/2014 1538   O2SAT 78.2 05/22/2014 1538    Urinalysis    Component Value Date/Time   COLORURINE YELLOW 05/22/2014 1549   APPEARANCEUR CLEAR 05/22/2014 1549   LABSPEC 1.010 05/22/2014 1549   PHURINE 5.0 05/22/2014 1549   GLUCOSEU NEGATIVE 05/22/2014 1549   HGBUR NEGATIVE 05/22/2014 1549   HGBUR negative  09/27/2009 1043   BILIRUBINUR NEGATIVE 05/22/2014 1549   BILIRUBINUR NEG 03/09/2014 0845   KETONESUR NEGATIVE 05/22/2014 1549   PROTEINUR NEGATIVE 05/22/2014 1549   PROTEINUR 100 03/09/2014 0845   UROBILINOGEN 0.2 05/22/2014 1549   UROBILINOGEN 0.2 03/09/2014 0845   NITRITE NEGATIVE 05/22/2014 1549   NITRITE NEG 03/09/2014 0845   LEUKOCYTESUR NEGATIVE 05/22/2014 1549    Imaging/Diagnostic Tests: EKG 05/22/14 - RAD, NSR with prolonged QTc to 490 ms   CXR 05/22/14- IMPRESSION:  Cardiomegaly and pulmonary vascular congestion. Interstitial  thickening bilaterally could reflect mild interstitial edema or  chronic changes related to the patient's known emphysema/COPD.  Right costophrenic blunting could reflect a small pleural effusion.  Echo (7/13) : EF 55-60%, mild LV concentric hypertrophy, normal diastolic function, mild MR, mild LA dilation  V/Q Scan (7/13) : Ventilation: No focal ventilation defect. Perfusion: No wedge shaped peripheral perfusion defects to suggest  acute pulmonary embolism.  IMPRESSION: Normal perfusion scan    Lavon Paganini, MD 05/24/2014, 7:56 AM PGY-1, [place] Intern pager: [phone], text pages welcome

## 2014-05-24 NOTE — Clinical Documentation Improvement (Signed)
Internal Medicine MD's, NP's, and PA's  Patient's O2 sat to 58 %, 70% , 84% , 86 % after receiving Fentanyl.   Placed on 3L [place]  and then 10- 15 L non re-breather mask,  only documentation of hypoxia, if appropriate please add clinical condition pertaining to status at time of event.  Thank you  Possible Clinical Conditions?  Acute Respiratory Distress  Acute  Respiratory Failure  Acute Respiratory Insufficiency  Other Condition  Cannot Clinically Determine    Risk Factors: h/o  Systolic chf , age   Signs & Symptoms: Saus dropped from 90's to 49's  Diagnostics: ABG's  Treatment: Oxygen 3L , 10- 15L no re- breather mask  Thank You, Ree Kida ,RN Clinical Documentation Specialist:  [phone]  [place] Information Management

## 2014-05-27 ENCOUNTER — Ambulatory Visit (INDEPENDENT_AMBULATORY_CARE_PROVIDER_SITE_OTHER): Payer: Medicare Other | Admitting: Cardiology

## 2014-05-27 ENCOUNTER — Encounter: Payer: Self-pay | Admitting: Cardiology

## 2014-05-27 VITALS — BP 138/82 | HR 72 | Ht 72.0 in | Wt 255.0 lb

## 2014-05-27 DIAGNOSIS — I712 Thoracic aortic aneurysm, without rupture, unspecified: Secondary | ICD-10-CM

## 2014-05-27 DIAGNOSIS — I10 Essential (primary) hypertension: Secondary | ICD-10-CM

## 2014-05-27 DIAGNOSIS — I428 Other cardiomyopathies: Secondary | ICD-10-CM

## 2014-05-27 DIAGNOSIS — E785 Hyperlipidemia, unspecified: Secondary | ICD-10-CM

## 2014-05-27 NOTE — Assessment & Plan Note (Signed)
LV function has normalized on most recent echocardiogram. Previous reduction most likely related to uncontrolled hypertension. Continue present medications.

## 2014-05-27 NOTE — Progress Notes (Signed)
HPI: FU hypertension. Cath in Feb 2002 revealed EF 50-55 and normal coronaries. CTA Jan 2006 showed no RAS. Last myoview 06/07/08 and revealed normal perfusion and EF 37 felt related to hypertensive heart disease. Chest CT 3/15 showed COPD, ascending aorta 4.4 cm. Admitted with hypoxia 7/15; RLE duplex with no DVT. Given lasix with improvement. Last echocardiogram 7/15 showed normal LV function, mild LAE and mild MR; mildly dilated aortic root. Ectopy noted during echo. VQ scan 7/15 normal. Dyspnea felt secondary to COPD and mild edema. Since he was last seen he denies dyspnea, chest pain, syncope. Occasional mild pedal edema.   Current Outpatient Prescriptions  Medication Sig Dispense Refill  . amLODipine (NORVASC) 10 MG tablet Take 10 mg by mouth daily.      Marland Kitchen atorvastatin (LIPITOR) 80 MG tablet Take 1 tablet (80 mg total) by mouth daily at 6 PM.  30 tablet  0  . benazepril (LOTENSIN) 40 MG tablet Take 40 mg by mouth daily.      . colchicine 0.6 MG tablet Take 1 tablet (0.6 mg total) by mouth 2 (two) times daily.  60 tablet  0  . doxazosin (CARDURA) 8 MG tablet Take 8 mg by mouth daily.      . furosemide (LASIX) 80 MG tablet Take 40-80 mg by mouth daily as needed for fluid.      . hydrALAZINE (APRESOLINE) 100 MG tablet Take 100 mg by mouth 3 (three) times daily.      . isosorbide mononitrate (IMDUR) 60 MG 24 hr tablet Take 60 mg by mouth daily.      . metFORMIN (GLUCOPHAGE) 1000 MG tablet Take 1,000 mg by mouth 2 (two) times daily with a meal.      . metoprolol (LOPRESSOR) 100 MG tablet Take 100 mg by mouth 2 (two) times daily.      Marland Kitchen oxycodone (ROXICODONE) 30 MG immediate release tablet Take 30 mg by mouth every 6 (six) hours as needed for pain.      . potassium chloride (K-DUR,KLOR-CON) 10 MEQ tablet Take 10 mEq by mouth daily.      . simvastatin (ZOCOR) 20 MG tablet        No current facility-administered medications for this visit.     Past Medical History  Diagnosis Date  .  Renal insufficiency, mild   . OA (osteoarthritis) of knee     right; mod-severe  . DM (diabetes mellitus), type 2     mild mod sensory loss plantar feet  . OA (osteoarthritis)   . History of colonoscopy   . Peripheral autonomic neuropathy due to secondary diabetes   . Hypothyroidism     since thyroidectomy  . Anxiety     takes buspar  . Sleep apnea     on cpap  . Complication of anesthesia     pt. reports he "flat lined " during surgery in  1999, told that  he had to be revived .  Pt. reports that it was surg. to replace his knee & [facility]  on [address].  [facility] to [facility]- no record avail. on this pt.   Marland Kitchen HTN (hypertension)     labile, followed by Dr. Stanford Breed  . Diastolic dysfunction   . Renal insufficiency   . CHF exacerbation 03/05/2013    Past Surgical History  Procedure Laterality Date  . Total knee arthroplasty  2001    left  . Thyroidectomy, partial      remote  .  Cardiolite--no cad ef 34%  01/01/2005  . Joint replacement  2000    left knee   . Lumbar laminectomy/decompression microdiscectomy  09/25/2011    Procedure: LUMBAR LAMINECTOMY/DECOMPRESSION MICRODISCECTOMY;  Surgeon: Floyce Stakes;  Location: [place];  Service: Neurosurgery;  Laterality: Right;  Right Lumbar Five-Sacral One Microdiscectomy  . Back surgery      11/12  . Lumbar laminectomy/decompression microdiscectomy  01/03/2012    Procedure: LUMBAR LAMINECTOMY/DECOMPRESSION MICRODISCECTOMY;  Surgeon: Floyce Stakes, MD;  Location: [facility];  Service: Neurosurgery;  Laterality: Right;  Right L5-S1 Redo Diskectomy    History   Social History  . Marital Status: Married    Spouse Name: Towanna    Number of Children: 4  . Years of Education: some colle   Occupational History  . Retired-printing    Social History Main Topics  . Smoking status: Former Smoker -- 1.00 packs/day for 28 years    Types: Cigarettes    Quit date: 04/07/2013  . Smokeless tobacco: Never  Used     Comment: wants to get back pain under control then wants to discuss  . Alcohol Use: No  . Drug Use: No  . Sexual Activity: Not on file   Other Topics Concern  . Not on file   Social History Narrative   Health Care POA:    Emergency Contact: wife, Budd Palmer [phone]   End of Life Plan:    Who lives with you: wife   Any pets: none   Diet: Pt has a varied diet of protein, starch, and vegetables.    Exercise: Pt reports exercising 1-3x a week at [facility].   Seatbelts: Pt reports wearing seatbelt when in vehicles.    Hobbies: fishing with cousin    ROS: no fevers or chills, productive cough, hemoptysis, dysphasia, odynophagia, melena, hematochezia, dysuria, hematuria, rash, seizure activity, orthopnea, PND, pedal edema, claudication. Remaining systems are negative.  Physical Exam: Well-developed well-nourished in no acute distress.  Skin is warm and dry.  HEENT is normal.  Neck is supple.  Chest is clear to auscultation with normal expansion.  Cardiovascular exam is regular rate and rhythm.  Abdominal exam nontender or distended. No masses palpated. Extremities show 1+ edema. neuro grossly intact  ECG 05/23/2014-sinus rhythm, first degree AV block, right axis deviation.

## 2014-05-27 NOTE — Assessment & Plan Note (Signed)
Blood pressure controlled. Continue present medications. 

## 2014-05-27 NOTE — Assessment & Plan Note (Signed)
Continue statin. 

## 2014-05-27 NOTE — Patient Instructions (Signed)
Your physician wants you to follow-up in: ONE YEAR WITH DR CRENSHAW You will receive a reminder letter in the mail two months in advance. If you don't receive a letter, please call our office to schedule the follow-up appointment.  

## 2014-05-27 NOTE — Assessment & Plan Note (Signed)
Patient will need repeat imaging when he returns in one year.

## 2014-05-31 ENCOUNTER — Ambulatory Visit (INDEPENDENT_AMBULATORY_CARE_PROVIDER_SITE_OTHER): Payer: Medicare Other | Admitting: Family Medicine

## 2014-05-31 ENCOUNTER — Encounter: Payer: Self-pay | Admitting: Family Medicine

## 2014-05-31 VITALS — BP 156/89 | HR 92 | Temp 98.6°F | Resp 20 | Wt 260.0 lb

## 2014-05-31 DIAGNOSIS — J438 Other emphysema: Secondary | ICD-10-CM

## 2014-05-31 DIAGNOSIS — J432 Centrilobular emphysema: Secondary | ICD-10-CM

## 2014-05-31 DIAGNOSIS — G4733 Obstructive sleep apnea (adult) (pediatric): Secondary | ICD-10-CM

## 2014-05-31 DIAGNOSIS — F17200 Nicotine dependence, unspecified, uncomplicated: Secondary | ICD-10-CM

## 2014-05-31 MED ORDER — ALBUTEROL SULFATE HFA 108 (90 BASE) MCG/ACT IN AERS: 2.0000 | INHALATION_SPRAY | Freq: Four times a day (QID) | RESPIRATORY_TRACT | Status: AC | PRN

## 2014-05-31 NOTE — Assessment & Plan Note (Signed)
Continue nightly CPAP + 2L O2, admits good compliance - Follow-up with Pulmonology

## 2014-05-31 NOTE — Assessment & Plan Note (Signed)
Consistent with COPD, in setting of long smoking hx, complicated by OSA / OHS - No prior hx bronchodilators, ICS - PFTs 2 years ago  Plan: 1. Sent rx Albuterol MDI for PRN if exacerbation only 2. Continue 2L home O2 continuous x 24 hrs 3. Recommend starting ICS / anti-cholinergic controller medicine, decision to defer to Pulm / PCP at this time 4. Followed by Pulmonology (Dr. Annamaria Boots), has close f/u scheduled 05/2014, consider repeat PFTs as necessary 5. Smoking cessation 6. RTC PRN

## 2014-05-31 NOTE — Patient Instructions (Addendum)
Dear Marvel Plan, Thank you for coming in to clinic today. It was good to see you!  Today we discussed your Breathing, Oxygen, and Smoking. 1. For your breathing, it looks like you would be considered to have the diagnosis of "COPD" - Chronic Obstructive Pulmonary/Lung Disease, this is commonly caused by a smoking history 2. Important to keep wearing the 2 liters of oxygen all day and night (use with CPAP nightly) 3. I sent a prescription for an Albuterol Inhaler, use this only as needed for worsening difficulty breathing / cough. 2 puffs every 6 hours as needed. Do not need to use daily. 4. For the smoking, try your best to stay active and focus on your health to avoid temptation of continuing to smoke. I recommend following up with Dr. Nori Riis soon to discuss alternative strategies 5. Please follow-up with Dr. Annamaria Boots soon regarding your breathing and Oxygen use. Ask him about the need of long-term inhalers (such as Dulera or Advair) also ask him about repeat Lung Function Testing. He will advise you on the Oxygen as well.  If you develop worsening shortness of breath, coughing, chest pain, leg swelling, please call clinic to return, otherwise if urgent please go directly to Emergency Department.  Please schedule a follow-up appointment with Dr. Nori Riis in 1 month as needed.  If you have any other questions or concerns, please feel free to call the clinic to contact me. You may also schedule an earlier appointment if necessary.  However, if your symptoms get significantly worse, please go to the Emergency Department to seek immediate medical attention.  Nobie Putnam, [place]

## 2014-05-31 NOTE — Assessment & Plan Note (Signed)
Previously resumed smoking up to 1ppd prior to hospitalization, since discharge 05/24/14 has not smoked - Concerned about inc urge to smoke  Plan: 1. Encouraged / support for smoking cessation 2. Not interested in nicotine replacement therapy 3. Encourage inc exercise as tolerated 4. F/u with PCP

## 2014-05-31 NOTE — Progress Notes (Signed)
Subjective:     Patient ID: [name] [name], male   DOB: 1955/11/29, 58 y.o.   MRN: [id number]  HPI  Hospital Follow-up - (admitted 05/22/14 with dyspnea - discharged 05/24/14 - on 2L home O2)  Dyspnea / COPD / OSA / OHS: - No prior hx home O2, now on 2L continuous O2 (day / night) - Reports using CPAP (nightly, with 2L O2), every night, going well - Today feels much better since leaving the hospital, no significant dyspnea. None at rest, but some mild SOB if ambulation distances >200 feet, avoids stairs, quickly improves with rest - Denies chest pain or tightness, lightheadedness / dizziness, HA - Limited activity due to recent back surgery 2 years ago - Followed by Dr. Annamaria Boots (Pulmonology) - hx PFTs 2 years ago. Scheduled to follow-up at end of July 2015. Denies prior use of inhaleres  Tobacco abuse: - Concerned about urge to continue smoking, upset/anxiety and inc stress causes him to go smoke - History smoking since out of college (age 18 yrs), admits to smoking on and off for past 30 years - Longest successful quit for total 2 months (1 year ago), only when had alt focus to take up time - Previously improved urge to smoke when actively exercising, now difficulty exercising -Tried and unsuccessful with nicotine patch, nicotine gum, e-cig inhaler - No other smokers in home environment  PMH: Prior hx Systolic CHF, followed by Cardiology, recent ECHO 05/23/14 (EF 16-38%, no diastolic dysfunction). Recently seen by Dr. Stanford Breed (Cards).  I have reviewed and updated the following as appropriate: allergies and current medications  Social Hx: - Active smoker, 0.5 ppd up to 1 ppd, overall 30 year hx - Currently smoking 1 ppd, however nothing regular since out of hospital - On disability  Review of Systems  See above HPI    Objective:   Physical Exam  BP 156/89  Pulse 92  Temp(Src) 98.6 F (37 C) (Oral)  Resp 20  Wt 260 lb (117.935 kg)  SpO2 87%  On home 2L O2, initially low 77%  up to 88% (pulse ox in clinic)  Gen - obese, well-appearing, pleasant, NAD HEENT - oropharynx clear, MMM Heart - RRR, no murmurs heard Lungs - CTAB, no wheezing, crackles, or rhonchi. Normal work of breathing. Abd - soft, NTND, no masses, +active BS Ext - non-tender, +1 pitting edema b/l LE, peripheral pulses intact +2 b/l Skin - warm, dry, no rashes Neuro - awake, alert, oriented, grossly non-focal     Assessment:     See specific A&P problem list for details.      Plan:     See specific A&P problem list for details.

## 2014-06-08 ENCOUNTER — Encounter: Payer: Self-pay | Admitting: Family Medicine

## 2014-06-08 ENCOUNTER — Ambulatory Visit (INDEPENDENT_AMBULATORY_CARE_PROVIDER_SITE_OTHER): Payer: Medicare Other | Admitting: Family Medicine

## 2014-06-08 VITALS — BP 142/82 | HR 76 | Ht 72.0 in | Wt 259.4 lb

## 2014-06-08 DIAGNOSIS — R0981 Nasal congestion: Secondary | ICD-10-CM | POA: Insufficient documentation

## 2014-06-08 DIAGNOSIS — R0902 Hypoxemia: Secondary | ICD-10-CM

## 2014-06-08 DIAGNOSIS — I5022 Chronic systolic (congestive) heart failure: Secondary | ICD-10-CM

## 2014-06-08 DIAGNOSIS — J3489 Other specified disorders of nose and nasal sinuses: Secondary | ICD-10-CM

## 2014-06-08 DIAGNOSIS — I509 Heart failure, unspecified: Secondary | ICD-10-CM

## 2014-06-08 DIAGNOSIS — E118 Type 2 diabetes mellitus with unspecified complications: Secondary | ICD-10-CM

## 2014-06-08 LAB — POCT GLYCOSYLATED HEMOGLOBIN (HGB A1C): Hemoglobin A1C: 6.5

## 2014-06-08 MED ORDER — OXYCODONE HCL 30 MG PO TABS: 30.0000 mg | ORAL_TABLET | Freq: Four times a day (QID) | ORAL | Status: DC | PRN

## 2014-06-08 MED ORDER — COLCHICINE 0.6 MG PO TABS: ORAL_TABLET | ORAL | Status: AC

## 2014-06-08 NOTE — Assessment & Plan Note (Signed)
They put him on twice daily colchicine while in the hospital. I think we can back off to once daily unless he starts having symptoms. We discussed.

## 2014-06-08 NOTE — Assessment & Plan Note (Signed)
He would probably benefit from nasal spray with steroids but he does not want to add another medication right now because of financial issues. The a.c. straightened out I would add some fluticasone nasal.

## 2014-06-08 NOTE — Assessment & Plan Note (Signed)
Whenever his medications in detail.Greater than 50% of our 45 minute office visit was spent in counseling and education regarding these issues. He does need to be on his furosemide. It looks like to try to switch him from simvastatin to atorvastatin which we discussed. His Lasix as written for one half or one tab a day. He's been following his daily weight and is up 12-1/2 or 15 pounds so we'll start him back in one tab a day for the next 2-3 days and then he can tailor it depending on his weight. I'll see him back in 4-6 weeks to

## 2014-06-08 NOTE — Assessment & Plan Note (Signed)
Continue current regimen. He has followup with pulmonology.
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
## 2014-06-11 ENCOUNTER — Other Ambulatory Visit: Payer: Self-pay | Admitting: Cardiology

## 2014-07-04 ENCOUNTER — Telehealth: Payer: Self-pay | Admitting: Home Health Services

## 2014-07-04 NOTE — Telephone Encounter (Signed)
No answer - called to schedule diabetic eye exam

## 2014-07-08 ENCOUNTER — Telehealth: Payer: Self-pay | Admitting: Family Medicine

## 2014-07-08 NOTE — Telephone Encounter (Signed)
Refill request for Oxycodone.

## 2014-07-11 MED ORDER — OXYCODONE HCL 30 MG PO TABS: 30.0000 mg | ORAL_TABLET | Freq: Four times a day (QID) | ORAL | Status: DC | PRN

## 2014-07-13 ENCOUNTER — Other Ambulatory Visit: Payer: Self-pay | Admitting: Cardiology

## 2014-07-15 ENCOUNTER — Other Ambulatory Visit: Payer: Self-pay | Admitting: *Deleted

## 2014-07-15 MED ORDER — ATORVASTATIN CALCIUM 80 MG PO TABS: 80.0000 mg | ORAL_TABLET | Freq: Every day | ORAL | Status: AC

## 2014-07-20 ENCOUNTER — Ambulatory Visit: Payer: Medicare Other | Admitting: Family Medicine

## 2014-07-27 ENCOUNTER — Other Ambulatory Visit: Payer: Medicare Other

## 2014-07-27 ENCOUNTER — Telehealth: Payer: Self-pay | Admitting: Family Medicine

## 2014-07-27 ENCOUNTER — Other Ambulatory Visit: Payer: Self-pay | Admitting: Family Medicine

## 2014-07-27 ENCOUNTER — Ambulatory Visit
Admission: RE | Admit: 2014-07-27 | Discharge: 2014-07-27 | Disposition: A | Discharge status: Home / Self Care | Payer: Medicare Other | Source: Ambulatory Visit | Attending: Family Medicine | Admitting: Family Medicine

## 2014-07-27 DIAGNOSIS — R109 Unspecified abdominal pain: Secondary | ICD-10-CM

## 2014-07-27 MED ORDER — OXYCODONE HCL 30 MG PO TABS: ORAL_TABLET | ORAL | Status: DC

## 2014-07-27 NOTE — Progress Notes (Signed)
CMP AND CBC WITH DIFF DONE TODAY [name] [name]

## 2014-07-27 NOTE — Telephone Encounter (Signed)
.  Tamika Can u call him and tell him the belly x ray shows CHRONIC CONSTIPATION (probably from his pain meds)--I think this IS likely why  His belly is hurting. He needs to start taking a stool softener daily, and taking a pkg of miralax in water daily. DAILY. THANKS! Dorcas Mcmurray

## 2014-07-27 NOTE — Telephone Encounter (Signed)
Left voice message for pt to call RN regarding X-Ray results.  Derl Barrow, RN

## 2014-07-28 LAB — COMPREHENSIVE METABOLIC PANEL
ALBUMIN: 3.6 g/dL (ref 3.5–5.2)
ALK PHOS: 110 U/L (ref 39–117)
ALT: 19 U/L (ref 0–53)
AST: 15 U/L (ref 0–37)
BILIRUBIN TOTAL: 0.9 mg/dL (ref 0.2–1.2)
BUN: 13 mg/dL (ref 6–23)
CO2: 23 mEq/L (ref 19–32)
Calcium: 8.2 mg/dL — ABNORMAL LOW (ref 8.4–10.5)
Chloride: 106 mEq/L (ref 96–112)
Creat: 1.59 mg/dL — ABNORMAL HIGH (ref 0.50–1.35)
GLUCOSE: 148 mg/dL — AB (ref 70–99)
POTASSIUM: 3.8 meq/L (ref 3.5–5.3)
Sodium: 143 mEq/L (ref 135–145)
TOTAL PROTEIN: 6.2 g/dL (ref 6.0–8.3)

## 2014-07-28 LAB — CBC WITH DIFFERENTIAL/PLATELET
BASOS PCT: 0 % (ref 0–1)
Basophils Absolute: 0 10*3/uL (ref 0.0–0.1)
EOS ABS: 0.2 10*3/uL (ref 0.0–0.7)
EOS PCT: 3 % (ref 0–5)
HCT: 47.1 % (ref 39.0–52.0)
Hemoglobin: 15.8 g/dL (ref 13.0–17.0)
Lymphocytes Relative: 17 % (ref 12–46)
Lymphs Abs: 0.9 10*3/uL (ref 0.7–4.0)
MCH: 26.7 pg (ref 26.0–34.0)
MCHC: 33.5 g/dL (ref 30.0–36.0)
MCV: 79.7 fL (ref 78.0–100.0)
MONOS PCT: 9 % (ref 3–12)
Monocytes Absolute: 0.5 10*3/uL (ref 0.1–1.0)
Neutro Abs: 3.8 10*3/uL (ref 1.7–7.7)
Neutrophils Relative %: 71 % (ref 43–77)
Platelets: 86 10*3/uL — ABNORMAL LOW (ref 150–400)
RBC: 5.91 MIL/uL — AB (ref 4.22–5.81)
RDW: 18.9 % — ABNORMAL HIGH (ref 11.5–15.5)
WBC: 5.3 10*3/uL (ref 4.0–10.5)

## 2014-07-28 NOTE — Telephone Encounter (Signed)
Pt informed that x-ray showed chronic constipation and to start taking a daily stool softener and miralax in water daily.  Pt stated understanding.  Pt to follow up with PCP as needed.  Derl Barrow, RN

## 2014-08-04 ENCOUNTER — Telehealth: Payer: Self-pay | Admitting: Family Medicine

## 2014-08-04 NOTE — Telephone Encounter (Signed)
Clarise Cruz I am confused---per previous phone note we Mellette him the x ray results (chronic constipation) AND told him what to do about it. Please remind him THANKS! Dorcas Mcmurray

## 2014-08-04 NOTE — Telephone Encounter (Signed)
Would like to know x rays result Did dr neal send in a prescription for pain med?

## 2014-08-04 NOTE — Telephone Encounter (Signed)
Pain medication was given to patient but cannot be filled before 08/11/2015. Just need x-ray results

## 2014-08-10 ENCOUNTER — Encounter: Payer: Self-pay | Admitting: Family Medicine

## 2014-08-12 ENCOUNTER — Other Ambulatory Visit: Payer: Self-pay | Admitting: Family Medicine

## 2014-08-12 MED ORDER — DOXYCYCLINE HYCLATE 100 MG PO CAPS: 100.0000 mg | ORAL_CAPSULE | Freq: Two times a day (BID) | ORAL | Status: AC

## 2014-08-12 NOTE — Progress Notes (Signed)
Noted to be oxygenating poorly when he was in clinic with his wife. He was not using is portal oxygen. Pulse ox was 66. We placed him on 2 L nasal cannula and it rose to the mid 80s. Following day his wife came in for dressing change and said that he was coughing up some green phlegm. He's been extremely short of breath. This Friday afternoon so we will call in some antibiotics and see if we can get him through this if not he'll need to go the emergency department if he gets worse. I discussed this with his wife and she expresses understanding.

## 2014-08-13 ENCOUNTER — Other Ambulatory Visit: Payer: Self-pay | Admitting: Family Medicine

## 2014-08-31 ENCOUNTER — Other Ambulatory Visit: Payer: Self-pay | Admitting: Family Medicine

## 2014-08-31 MED ORDER — OXYCODONE HCL 30 MG PO TABS: ORAL_TABLET | ORAL | Status: DC

## 2014-08-31 NOTE — Progress Notes (Signed)
Gave rx to his wife at her appt today

## 2014-09-13 ENCOUNTER — Encounter: Payer: Self-pay | Admitting: Family Medicine

## 2014-09-13 NOTE — Progress Notes (Signed)
Pt informed that form was completed and faxed off per pt request.  Original copies placed up front with clerical staff for pt pick up.  Derl Barrow, RN

## 2014-09-13 NOTE — Progress Notes (Signed)
Placed in PCP box for completion

## 2014-09-13 NOTE — Progress Notes (Signed)
Patient dropped off forms to be filled out.  Please fax when completed.

## 2014-09-21 ENCOUNTER — Ambulatory Visit (INDEPENDENT_AMBULATORY_CARE_PROVIDER_SITE_OTHER): Payer: Medicare Other | Admitting: Family Medicine

## 2014-09-21 ENCOUNTER — Other Ambulatory Visit: Payer: Self-pay | Admitting: Family Medicine

## 2014-09-21 ENCOUNTER — Ambulatory Visit (INDEPENDENT_AMBULATORY_CARE_PROVIDER_SITE_OTHER): Payer: Medicare Other | Admitting: *Deleted

## 2014-09-21 ENCOUNTER — Encounter: Payer: Self-pay | Admitting: Family Medicine

## 2014-09-21 VITALS — BP 158/100 | HR 82 | Ht 72.0 in | Wt 257.4 lb

## 2014-09-21 DIAGNOSIS — R410 Disorientation, unspecified: Secondary | ICD-10-CM

## 2014-09-21 DIAGNOSIS — E118 Type 2 diabetes mellitus with unspecified complications: Secondary | ICD-10-CM

## 2014-09-21 DIAGNOSIS — Z23 Encounter for immunization: Secondary | ICD-10-CM

## 2014-09-21 LAB — POCT GLYCOSYLATED HEMOGLOBIN (HGB A1C): Hemoglobin A1C: 6.2

## 2014-09-23 DIAGNOSIS — R410 Disorientation, unspecified: Secondary | ICD-10-CM | POA: Insufficient documentation
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
## 2014-09-27 ENCOUNTER — Encounter: Payer: Self-pay | Admitting: Family Medicine

## 2014-09-28 ENCOUNTER — Other Ambulatory Visit: Payer: Self-pay | Admitting: Family Medicine

## 2014-09-28 MED ORDER — OXYCODONE HCL 30 MG PO TABS: ORAL_TABLET | ORAL | Status: AC

## 2014-10-18 ENCOUNTER — Other Ambulatory Visit: Payer: Self-pay | Admitting: Cardiology

## 2014-10-19 ENCOUNTER — Encounter (HOSPITAL_COMMUNITY): Payer: Self-pay

## 2014-10-19 ENCOUNTER — Other Ambulatory Visit: Payer: Self-pay | Admitting: Cardiology

## 2014-10-19 ENCOUNTER — Emergency Department (HOSPITAL_COMMUNITY)
Admission: EM | Admit: 2014-10-19 | Discharge: 2014-11-11 | Disposition: E | Payer: Medicare Other | Attending: Emergency Medicine | Admitting: Emergency Medicine

## 2014-10-19 DIAGNOSIS — G4730 Sleep apnea, unspecified: Secondary | ICD-10-CM | POA: Diagnosis not present

## 2014-10-19 DIAGNOSIS — Z87448 Personal history of other diseases of urinary system: Secondary | ICD-10-CM | POA: Diagnosis not present

## 2014-10-19 DIAGNOSIS — Z79899 Other long term (current) drug therapy: Secondary | ICD-10-CM | POA: Diagnosis not present

## 2014-10-19 DIAGNOSIS — Z87891 Personal history of nicotine dependence: Secondary | ICD-10-CM | POA: Diagnosis not present

## 2014-10-19 DIAGNOSIS — I469 Cardiac arrest, cause unspecified: Secondary | ICD-10-CM | POA: Insufficient documentation

## 2014-10-19 DIAGNOSIS — E1342 Other specified diabetes mellitus with diabetic polyneuropathy: Secondary | ICD-10-CM | POA: Diagnosis not present

## 2014-10-19 DIAGNOSIS — I10 Essential (primary) hypertension: Secondary | ICD-10-CM | POA: Diagnosis not present

## 2014-10-19 DIAGNOSIS — Z792 Long term (current) use of antibiotics: Secondary | ICD-10-CM | POA: Diagnosis not present

## 2014-10-19 DIAGNOSIS — Z9981 Dependence on supplemental oxygen: Secondary | ICD-10-CM | POA: Insufficient documentation

## 2014-10-19 DIAGNOSIS — F419 Anxiety disorder, unspecified: Secondary | ICD-10-CM | POA: Insufficient documentation

## 2014-10-19 DIAGNOSIS — M179 Osteoarthritis of knee, unspecified: Secondary | ICD-10-CM | POA: Insufficient documentation

## 2014-10-19 LAB — I-STAT TROPONIN, ED: Troponin i, poc: 0.02 ng/mL (ref 0.00–0.08)

## 2014-10-19 LAB — I-STAT CHEM 8, ED
BUN: 20 mg/dL (ref 6–23)
CALCIUM ION: 0.9 mmol/L — AB (ref 1.12–1.23)
CHLORIDE: 106 meq/L (ref 96–112)
Creatinine, Ser: 1.7 mg/dL — ABNORMAL HIGH (ref 0.50–1.35)
Glucose, Bld: 262 mg/dL — ABNORMAL HIGH (ref 70–99)
HEMATOCRIT: 52 % (ref 39.0–52.0)
Hemoglobin: 17.7 g/dL — ABNORMAL HIGH (ref 13.0–17.0)
Potassium: 4.4 mEq/L (ref 3.7–5.3)
Sodium: 143 mEq/L (ref 137–147)
TCO2: 15 mmol/L (ref 0–100)

## 2014-10-19 MED ORDER — ATROPINE SULFATE 1 MG/ML IJ SOLN
INTRAMUSCULAR | Status: AC | PRN
  Administered 2014-10-19: 1 mg via INTRAVENOUS

## 2014-10-19 MED ORDER — SODIUM CHLORIDE 0.9 % IV SOLN
INTRAVENOUS | Status: AC | PRN
  Administered 2014-10-19: 1000 mL via INTRAVENOUS

## 2014-10-19 MED ORDER — EPINEPHRINE HCL 0.1 MG/ML IJ SOSY
PREFILLED_SYRINGE | INTRAMUSCULAR | Status: AC | PRN
  Administered 2014-10-19 (×3): 1 via INTRAVENOUS

## 2014-10-19 MED FILL — Medication: Qty: 1 | Status: AC

## 2014-10-20 ENCOUNTER — Other Ambulatory Visit: Payer: Self-pay | Admitting: Cardiology

## 2014-10-24 IMAGING — CR DG CHEST 1V PORT
1 series · 1 of 1 positions shown · non-contrast
Comparison: CT chest and chest radiograph 03/02/2013.

CLINICAL DATA: Shortness of breath, pulmonary congestion.

PORTABLE CHEST - 1 VIEW

[AP]
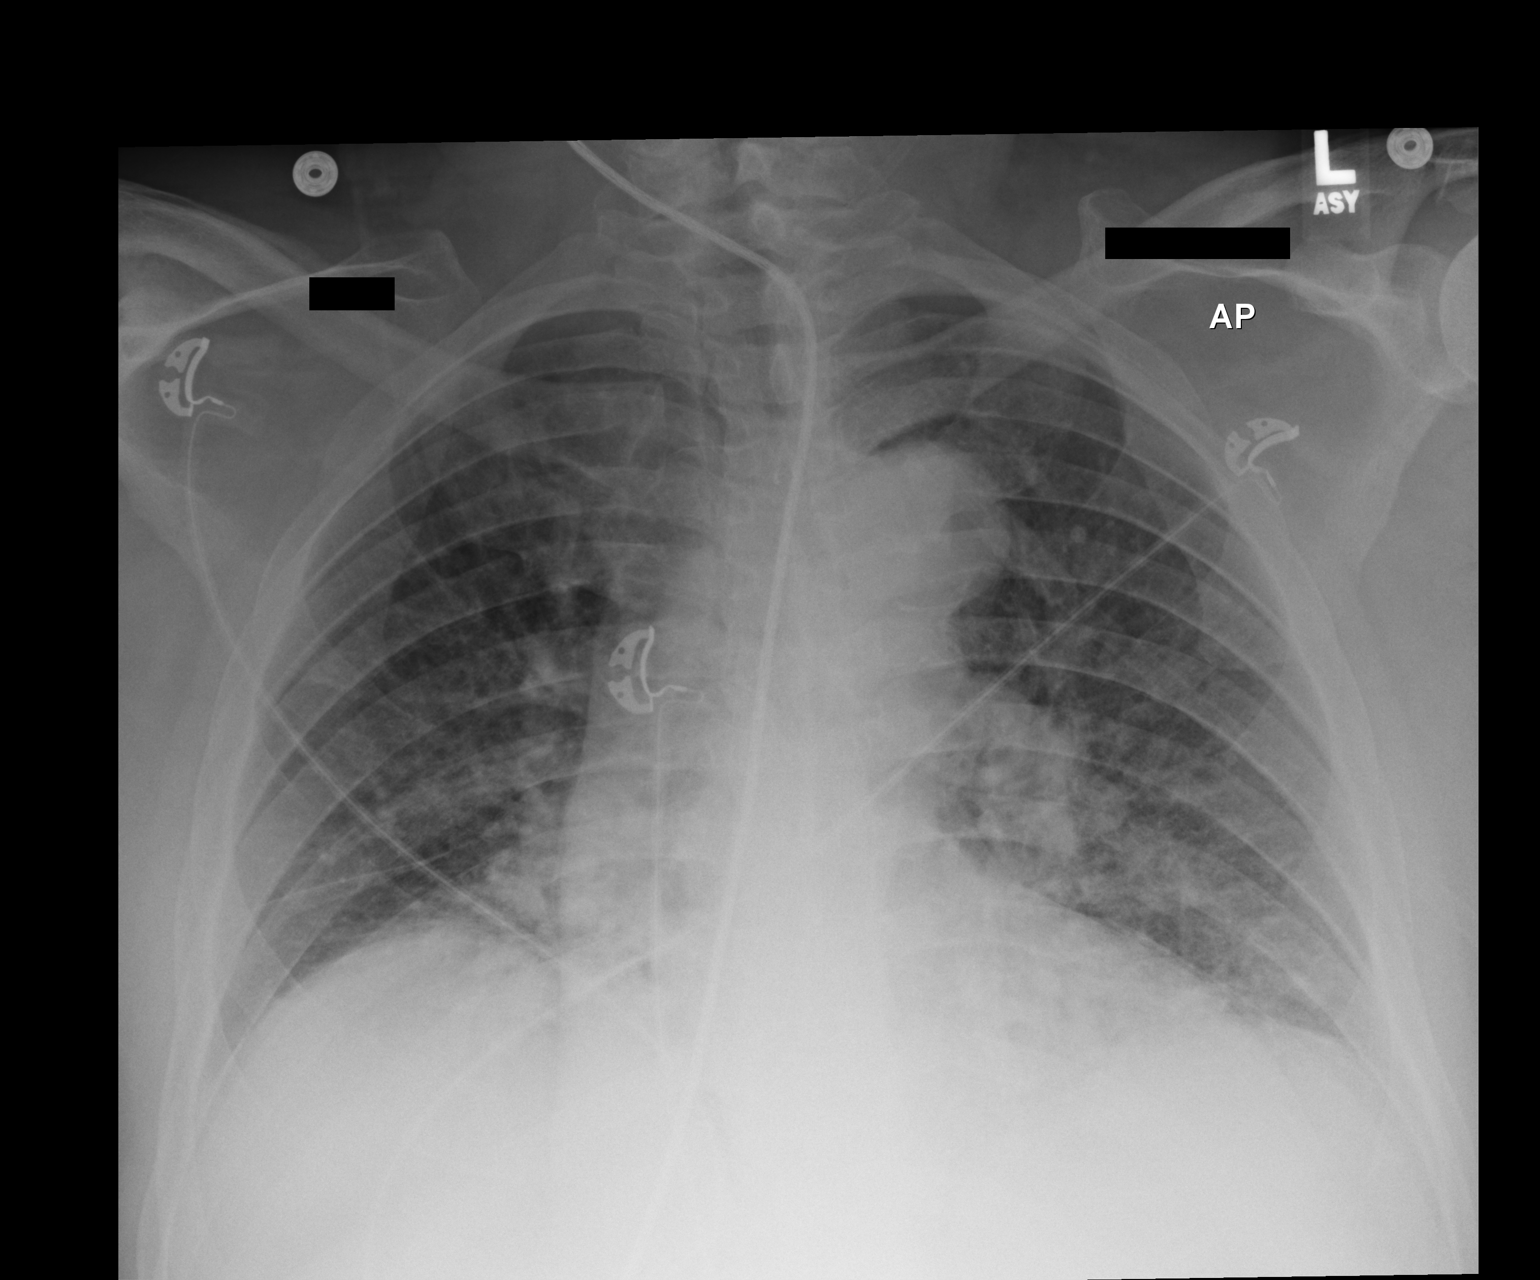

[1 of 1 positions shown; findings below may reference images not displayed]

FINDINGS: Trachea is midline.  Heart size stable.  Lungs are low in
volume with diffuse mixed interstitial and air space disease,
possibly minimally worsened from 03/02/2013.  No definite pleural
fluid.
IMPRESSION: Probable pulmonary edema with slight worsening from 03/02/2013.

## 2014-11-11 NOTE — Progress Notes (Signed)
Chaplain responded to CPR in progress. Chaplain facilitated communication between pt family and medical team. Chaplain offered prayer with pt wife and offered hospitality to family. Chaplain escorted pt family back and forth from consult room a to trauma c. Pt family has strong support system. Chaplain gave next of kin information to pt nurse. Pt family does not know funeral home arrangements so chaplain gave them patient placement card. Page chaplain as needed.    10-27-2014 1600  Clinical Encounter Type  Visited With Family;Health care provider  Visit Type Death  Referral From [facility]  Spiritual Encounters  Spiritual Needs Grief support;Emotional;Prayer  Stress Factors  Family Stress Factors Loss  Maleni Seyer, Barbette Hair, Chaplain 10-27-14 5:03 PM

## 2014-11-11 NOTE — ED Notes (Addendum)
Pt was brought in by wife for not feeling well. [place] in [place] from [place] and CPR initiated by Nurse First. Brought to Trauma C.

## 2014-11-11 NOTE — Code Documentation (Signed)
Family updated as to patient's status.

## 2014-11-11 NOTE — Code Documentation (Signed)
Dr. Rogene Houston at the bedside.
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
## 2014-11-11 NOTE — Code Documentation (Signed)
Patient time of death occurred at 91

## 2014-11-11 DEATH — deceased
